# Patient Record
Sex: Female | Born: 1935 | Race: White | Hispanic: No | Smoking: Former smoker
Health system: Southern US, Community
[De-identification: ages and names within clinical notes are randomized; demographics above are authoritative.]

## PROBLEM LIST (undated history)

## (undated) DIAGNOSIS — E7800 Pure hypercholesterolemia, unspecified: Secondary | ICD-10-CM

## (undated) DIAGNOSIS — H409 Unspecified glaucoma: Secondary | ICD-10-CM

## (undated) DIAGNOSIS — E039 Hypothyroidism, unspecified: Secondary | ICD-10-CM

## (undated) DIAGNOSIS — I4892 Unspecified atrial flutter: Secondary | ICD-10-CM

## (undated) DIAGNOSIS — M8580 Other specified disorders of bone density and structure, unspecified site: Secondary | ICD-10-CM

## (undated) HISTORY — PX: APPENDECTOMY: SHX54

## (undated) HISTORY — DX: Unspecified glaucoma: H40.9

## (undated) HISTORY — DX: Hypothyroidism, unspecified: E03.9

## (undated) HISTORY — PX: BREAST SURGERY: SHX581

## (undated) HISTORY — DX: Other specified disorders of bone density and structure, unspecified site: M85.80

## (undated) HISTORY — PX: ABCESS DRAINAGE: SHX399

## (undated) HISTORY — PX: BREAST BIOPSY: SHX20

## (undated) HISTORY — PX: OTHER SURGICAL HISTORY: SHX169

## (undated) HISTORY — DX: Unspecified atrial flutter: I48.92

## (undated) HISTORY — DX: Pure hypercholesterolemia, unspecified: E78.00

---

## 1998-01-19 ENCOUNTER — Other Ambulatory Visit: Admission: RE | Admit: 1998-01-19 | Discharge: 1998-01-19 | Payer: Self-pay | Admitting: Gynecology

## 1998-08-10 ENCOUNTER — Other Ambulatory Visit: Admission: RE | Admit: 1998-08-10 | Discharge: 1998-08-10 | Payer: Self-pay | Admitting: Gynecology

## 1999-06-10 ENCOUNTER — Encounter: Payer: Self-pay | Admitting: Family Medicine

## 1999-06-10 ENCOUNTER — Encounter: Admission: RE | Admit: 1999-06-10 | Discharge: 1999-06-10 | Payer: Self-pay | Admitting: Family Medicine

## 1999-09-15 ENCOUNTER — Other Ambulatory Visit: Admission: RE | Admit: 1999-09-15 | Discharge: 1999-09-15 | Payer: Self-pay | Admitting: Gynecology

## 2000-06-13 ENCOUNTER — Encounter: Admission: RE | Admit: 2000-06-13 | Discharge: 2000-06-13 | Payer: Self-pay | Admitting: Family Medicine

## 2000-06-13 ENCOUNTER — Encounter: Payer: Self-pay | Admitting: Family Medicine

## 2000-09-19 ENCOUNTER — Other Ambulatory Visit: Admission: RE | Admit: 2000-09-19 | Discharge: 2000-09-19 | Payer: Self-pay | Admitting: Gynecology

## 2000-12-06 ENCOUNTER — Encounter: Payer: Self-pay | Admitting: Gynecology

## 2000-12-06 ENCOUNTER — Encounter: Admission: RE | Admit: 2000-12-06 | Discharge: 2000-12-06 | Payer: Self-pay | Admitting: Gynecology

## 2001-01-11 ENCOUNTER — Encounter (INDEPENDENT_AMBULATORY_CARE_PROVIDER_SITE_OTHER): Payer: Self-pay | Admitting: Specialist

## 2001-01-11 ENCOUNTER — Encounter: Payer: Self-pay | Admitting: General Surgery

## 2001-01-11 ENCOUNTER — Inpatient Hospital Stay (HOSPITAL_COMMUNITY): Admission: EM | Admit: 2001-01-11 | Discharge: 2001-01-15 | Payer: Self-pay | Admitting: Emergency Medicine

## 2001-06-20 ENCOUNTER — Encounter: Admission: RE | Admit: 2001-06-20 | Discharge: 2001-06-20 | Payer: Self-pay | Admitting: Family Medicine

## 2001-06-20 ENCOUNTER — Encounter: Payer: Self-pay | Admitting: Family Medicine

## 2001-10-04 ENCOUNTER — Other Ambulatory Visit: Admission: RE | Admit: 2001-10-04 | Discharge: 2001-10-04 | Payer: Self-pay | Admitting: Gynecology

## 2002-07-03 ENCOUNTER — Encounter: Admission: RE | Admit: 2002-07-03 | Discharge: 2002-07-03 | Payer: Self-pay | Admitting: Family Medicine

## 2002-07-03 ENCOUNTER — Encounter: Payer: Self-pay | Admitting: Family Medicine

## 2002-07-05 ENCOUNTER — Encounter: Admission: RE | Admit: 2002-07-05 | Discharge: 2002-07-05 | Payer: Self-pay | Admitting: Family Medicine

## 2002-07-05 ENCOUNTER — Encounter: Payer: Self-pay | Admitting: Family Medicine

## 2002-10-17 ENCOUNTER — Other Ambulatory Visit: Admission: RE | Admit: 2002-10-17 | Discharge: 2002-10-17 | Payer: Self-pay | Admitting: Gynecology

## 2003-07-04 ENCOUNTER — Encounter: Admission: RE | Admit: 2003-07-04 | Discharge: 2003-07-04 | Payer: Self-pay | Admitting: Family Medicine

## 2003-10-31 ENCOUNTER — Other Ambulatory Visit: Admission: RE | Admit: 2003-10-31 | Discharge: 2003-10-31 | Payer: Self-pay | Admitting: Gynecology

## 2004-07-20 ENCOUNTER — Encounter: Admission: RE | Admit: 2004-07-20 | Discharge: 2004-07-20 | Payer: Self-pay | Admitting: Family Medicine

## 2004-11-11 ENCOUNTER — Other Ambulatory Visit: Admission: RE | Admit: 2004-11-11 | Discharge: 2004-11-11 | Payer: Self-pay | Admitting: Gynecology

## 2005-07-22 ENCOUNTER — Encounter: Admission: RE | Admit: 2005-07-22 | Discharge: 2005-07-22 | Payer: Self-pay | Admitting: Gynecology

## 2005-11-17 ENCOUNTER — Other Ambulatory Visit: Admission: RE | Admit: 2005-11-17 | Discharge: 2005-11-17 | Payer: Self-pay | Admitting: Gynecology

## 2006-07-25 ENCOUNTER — Encounter: Admission: RE | Admit: 2006-07-25 | Discharge: 2006-07-25 | Payer: Self-pay | Admitting: Family Medicine

## 2006-11-30 ENCOUNTER — Other Ambulatory Visit: Admission: RE | Admit: 2006-11-30 | Discharge: 2006-11-30 | Payer: Self-pay | Admitting: Gynecology

## 2007-07-26 ENCOUNTER — Encounter: Admission: RE | Admit: 2007-07-26 | Discharge: 2007-07-26 | Payer: Self-pay | Admitting: Gynecology

## 2007-12-10 ENCOUNTER — Other Ambulatory Visit: Admission: RE | Admit: 2007-12-10 | Discharge: 2007-12-10 | Payer: Self-pay | Admitting: Gynecology

## 2008-03-25 ENCOUNTER — Ambulatory Visit: Payer: Self-pay | Admitting: Gynecology

## 2008-04-01 ENCOUNTER — Ambulatory Visit: Payer: Self-pay | Admitting: Gynecology

## 2008-07-29 ENCOUNTER — Encounter: Admission: RE | Admit: 2008-07-29 | Discharge: 2008-07-29 | Payer: Self-pay | Admitting: Gynecology

## 2008-09-23 ENCOUNTER — Encounter: Payer: Self-pay | Admitting: Internal Medicine

## 2008-12-19 ENCOUNTER — Ambulatory Visit: Payer: Self-pay | Admitting: Gynecology

## 2008-12-19 ENCOUNTER — Other Ambulatory Visit: Admission: RE | Admit: 2008-12-19 | Discharge: 2008-12-19 | Payer: Self-pay | Admitting: Gynecology

## 2008-12-19 ENCOUNTER — Encounter: Payer: Self-pay | Admitting: Gynecology

## 2009-01-08 ENCOUNTER — Encounter: Admission: RE | Admit: 2009-01-08 | Discharge: 2009-01-08 | Payer: Self-pay | Admitting: Sports Medicine

## 2009-07-30 ENCOUNTER — Encounter: Admission: RE | Admit: 2009-07-30 | Discharge: 2009-07-30 | Payer: Self-pay | Admitting: Gynecology

## 2009-12-21 ENCOUNTER — Other Ambulatory Visit: Admission: RE | Admit: 2009-12-21 | Discharge: 2009-12-21 | Payer: Self-pay | Admitting: Gynecology

## 2009-12-21 ENCOUNTER — Ambulatory Visit: Payer: Self-pay | Admitting: Gynecology

## 2010-05-11 ENCOUNTER — Ambulatory Visit
Admission: RE | Admit: 2010-05-11 | Discharge: 2010-05-11 | Payer: Self-pay | Source: Home / Self Care | Attending: Gynecology | Admitting: Gynecology

## 2010-05-27 NOTE — Letter (Signed)
Summary: [facility]   Imported By: Lester [place] 10/17/2008 11:16:05  _____________________________________________________________________  External Attachment:    Type:   Image     Comment:   External Document

## 2010-06-30 ENCOUNTER — Other Ambulatory Visit: Payer: Self-pay | Admitting: Gynecology

## 2010-06-30 DIAGNOSIS — Z1231 Encounter for screening mammogram for malignant neoplasm of breast: Secondary | ICD-10-CM

## 2010-08-03 ENCOUNTER — Ambulatory Visit
Admission: RE | Admit: 2010-08-03 | Discharge: 2010-08-03 | Disposition: A | Discharge status: Home / Self Care | Payer: Medicare Other | Source: Ambulatory Visit | Attending: Gynecology | Admitting: Gynecology

## 2010-08-03 DIAGNOSIS — Z1231 Encounter for screening mammogram for malignant neoplasm of breast: Secondary | ICD-10-CM

## 2010-08-04 ENCOUNTER — Other Ambulatory Visit: Payer: Self-pay | Admitting: Gynecology

## 2010-08-04 DIAGNOSIS — R928 Other abnormal and inconclusive findings on diagnostic imaging of breast: Secondary | ICD-10-CM

## 2010-08-06 ENCOUNTER — Ambulatory Visit
Admission: RE | Admit: 2010-08-06 | Discharge: 2010-08-06 | Disposition: A | Discharge status: Home / Self Care | Payer: Medicare Other | Source: Ambulatory Visit | Attending: Gynecology | Admitting: Gynecology

## 2010-08-06 DIAGNOSIS — R928 Other abnormal and inconclusive findings on diagnostic imaging of breast: Secondary | ICD-10-CM

## 2010-09-10 NOTE — H&P (Signed)
[facility]  Patient:    Megan Molina, Megan Molina Visit Number: [id number] MRN: [id number]          Service Type: SUR Location: 3W 0343 02 Attending Physician:  Henrene Dodge Dictated by:   Anselm Pancoast. Zachery Dakins, M.D. Admit Date:  01/11/2001                           History and Physical  CHIEF COMPLAINT:  Abdominal pain.  HISTORY:  Megan Molina is a 77 year old Caucasian female referred by Dr. Margrett Rud for evaluation of localized right lower quadrant pain. The patient was seen today by Dr. Andrey Campanile after an approximately two day progressive history of right lower quadrant tenderness and urinalysis was normal in his office and he found her definitely locally tender to the right lower quadrant. He did a pelvic exam exam and thought this was unremarkable and called and I suggested I see her in the emergency room. On physical exam in the emergency room, she was not febrile but she was definitely locally tender in the right lower quadrant of her abdomen. She is 52, postmenopausal, and has not had a hysterectomy or other gynecological surgery, and I discussed with her on what her symptoms were and she said that Monday, September 16, possibly Tuesday, September 17, she was a little gaseous, a little bloated, not really having any significant localized pain and then yesterday, September 18, started having definitely right lower quadrant pain that has progressed today. Her symptoms have not been associated with vomiting and she is not running a fever. On physical exam, she was definitely tender, well localized to the right lower quadrant with muscle guarding and mild rebound. She is not tender in the upper abdomen and is not tender on the left and she said she has had some loose bowel movements but not really diarrhea. Chest x-ray was unremarkable and CBC showed a white count of 10,900 with a slight left shift but that not of a obvious marked inflammatory type  reaction. I then sent her over for a pelvic ultrasound and they said that as far as her right tube and ovary looked unremarkable and that she seemed to have _______ tenderness in the area of her cecum. I thought this was most likely appendicitis probably was not _______ nausea and vomiting, it was probably a retrocecal appendicitis.  PAST MEDICAL HISTORY:  She has had previous face lift and breast surgery.  ALLERGIES:  She says she is allergic to SULFA.  MEDICATIONS:  Her only chronic medication is thyroid replacement and a cholesterol medicine.  REVIEW OF SYSTEMS:  EYES, EARS, NOSE and THROAT: Denies acute symptoms. CHEST: I think she does smoke. CARDIAC: No history of angina or hypertension. ABDOMEN: See history of present illness. GYN: She is about 15 years postmenopausal. EXTREMITIES: Unremarkable. CNS: No problems.  PHYSICAL EXAMINATION:  GENERAL:  She is a female who appears younger than her stated age.  VITAL SIGNS:  Temperature was 97, blood pressure 145/64, 76, and respirations 16.  HEENT:  Unremarkable.  LUNGS:  Clear.  HEART:  Normal sinus rhythm.  BREASTS:  Negative.  ABDOMEN:  Locally tender right lower quadrant with muscle guarding, mild rebound. She is not tender in left lower quadrant.  PELVIC AND RECTAL: I did not do a pelvic or rectal as Dr. Andrey Campanile had just done it and he reported it was unremarkable.  EXTREMITIES:  Negative.  ADMISSION IMPRESSION:  It is probably right lower  quadrant, probably acute appendicitis, possibly retrocecal appendicitis.  PLAN:  The patient will be started on intravenous fluids, given 3 g of Unasyn, and scheduled for right lower quadrant exploration and appendectomy. Dictated by:   Anselm Pancoast. Zachery Dakins, M.D. Attending Physician:  Henrene Dodge DD:  01/11/01 TD:  01/11/01 Job: [id number]

## 2010-09-10 NOTE — Op Note (Signed)
[facility]  Patient:    Megan Molina, Megan Molina Visit Number: [id number] MRN: [id number]          Service Type: SUR Location: 3W 0343 02 Attending Physician:  Henrene Dodge Dictated by:   Anselm Pancoast. Zachery Dakins, M.D. Proc. Date: 01/11/01 Admit Date:  01/11/2001   CC:         Duffy Rhody C. Andrey Campanile, M.D.   Operative Report  PREOPERATIVE DIAGNOSIS:   Acute appendicitis.  POSTOPERATIVE DIAGNOSIS:  Acute cecal diverticulitis with abscess.  OPERATION:  Limited right colectomy, sort of a cecectomy with ileo-ascending colon anastomosis.  SURGEON:  Anselm Pancoast. Zachery Dakins, M.D.  ASSISTANT:  Nurse.  ANESTHESIA:  General.  INDICATIONS:  Megan Molina is a 77 year old Caucasian female who has had two day progressive history of right lower quadrant pain.  She has not had fever.  She has been slightly nauseated.  She has not vomited.  She was referred by Dr. Andrey Campanile.  He referred her.  On physical exam, she is definitely tender in the right lower quadrant.  White count was 10,900 and an ultrasound showed normal female organs and tenderness around the cecum.  I recommended that we proceed with an appendectomy.  Unasyn 3 was given.  She has an IV started and after induction of anesthesia, a Foley catheter was inserted.  With the anesthesia of the tenderness you could then feel a fullness/mass in the cecal area, and I made the incision just slightly higher than the usual McBurney point.  Sharp dissection down through the skin and subcutaneous, Scarpas fascia and external oblique was opened in its fascia lines and then the rectus muscle pulled medially and the transversalis and internal oblique were opened and then I opened it to the peritoneal cavity.  The cecum was right under the incision and with running the finger back into the retroperitoneal area you could feel this mass that looked like an inflammatory mass really at the cecum area.  The appendix was really basically  a retrocecal appendix but the appendix itself did not appear to be acutely inflamed.  I mobilized the peritoneum laterally so that the cecum and terminal ileum could be developed through this little incision.  I enlarged the incision just slightly but she is so thin I could bring it up above skin level.  I then on inspecting the area felt that this was probably a cecal diverticulitis.  There was obviously a mass in the cecum not an appendiceal abscess, and I elected to transect the mesentry from the terminal ileum just basically the cecum with Kellys, ligating this with 2-0 silk ties and then used the GIA to actually transect the cecum really about 3 inches from the ileocecal valve.  This was about 1 inch proximal to this area of inflammation.  I then used a Kocher to kind of transect the terminal ileum or actually cut this area and opened it on the back table and it looked as if this was definitely an inflammatory mass.  No evidence of any palpable tumor in the cecum.  I then sent this over to pathology and had the pathologist come in, and he opened the area and said there was a frank abscess within the cecal wall close to where the appendix was but whether this was a cecal diverticulitis or some type of variation of a ruptured appendix at his base, he could not really be sure it was definitely not a malignancy.  While this was being done, I did an anastomosis  from the terminal ileum to the anterior portion of the staple suture lines so I have taken this down into a single layer of 3-0 silk and notched it to part of the anastomosis which lies comfortable.  The little mesenteric defect was closed. There was a good two finger opening at the completion of the anastomosis.  I then placed everything back in its normal anatomic position, irrigated the area, good hemostasis and then closed the abdominal wall with a 2-0 Vicryl along the posterior rectus fascia, peritoneum and transversalis  laterally and then interrupted 2-0 Vicryl and then anterior fascia was a running 2-0 Vicryl also.  The wound was irrigated between layers.  The Scarpas was closed with some 3-0 Vicryl and then 5-0 Dexon subcuticular and Steri-Strips on the small incision.  Sponge and needle counts are correct.  The estimated blood loss is minimal.  I am planning to keep the patient NPO.  Hopefully I will not have to place a nasogastric tube, and we will keep her on antibiotics for at least 48 hours. Dictated by:   Anselm Pancoast. Zachery Dakins, M.D. Attending Physician:  Henrene Dodge DD:  01/11/01 TD:  01/11/01 Job: [id number]

## 2010-09-10 NOTE — Discharge Summary (Signed)
[facility]  Patient:    Megan Molina, Megan Molina Visit Number: [id number] MRN: [id number]          Service Type: SUR Location: 3W 0343 02 Attending Physician:  Henrene Dodge Dictated by:   Anselm Pancoast. Zachery Dakins, M.D. Admit Date:  01/11/2001 Discharge Date: 01/15/2001   CC:         Duffy Rhody C. Andrey Campanile, M.D.  Hedwig Morton. Juanda Chance, M.D. [facility]   Discharge Summary  DISCHARGE DIAGNOSIS:  Cecal diverticulitis with confined abscess.  OPERATION:  Right colectomy.  HISTORY OF PRESENT ILLNESS/HOSPITAL COURSE:  Megan Molina is a 77 year old Caucasian female referred to me by Dr. Margrett Rud with the presumptive diagnosis of acute appendicitis.  She had about a 48-hour history of progressive pain in the right lower quadrant, minimal nausea and, on physical exam, she was definitely tender with muscle guarding well localized to the right lower quadrant.  White count was 10,900, urinalysis was negative, and I recommended we proceed on with an open appendectomy.  After surgery, however, she was noted not to have an inflamed appendix.  The appendix was in the immediate area, but there was a definite inflammatory mass in the cecum that was predominantly posterior to the cecum and this was all elevated and a limited right colectomy was performed.  The pathologist was asked to come in and do a frozen on the exam to make sure it was not a tumor and there was a confined abscess within the wall of the cecum that originated from a cecal diverticulum.  On review of the permanent slide, the pathologist which is another one, Dr. Luisa Hart, found also inflammation within the appendix.  But, it appeared that the inflammation in the cecum was not the periappendiceal abscess like a more typical appendicitis and I think that the cecal diverticulum was the primary problem.  Anyway, postoperatively, she did nicely.  We did not place a nasogastric tube, we kept her n.p.o., and on about the second  postoperative day, she was able to void.  She had had a Foley that was removed the first postoperative morning and was started on a diet on the third postoperative day and discharged on the fourth. She was tolerating liquids without problems, her incision which was small in the right lower quadrant was healing nicely, and she was discharged to be seen in my office in approximately one week.  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  The patient had had a previous colonoscopy approximately three months by Dr. Lina Sar that showed no abnormalities as far as adenomas or other problems. Dictated by:   Anselm Pancoast. Zachery Dakins, M.D. Attending Physician:  Henrene Dodge DD:  02/15/01 TD:  02/17/01 Job: [id number]

## 2010-10-18 ENCOUNTER — Encounter: Payer: Self-pay | Admitting: Internal Medicine

## 2010-12-23 ENCOUNTER — Encounter: Payer: Medicare Other | Admitting: Gynecology

## 2010-12-24 ENCOUNTER — Ambulatory Visit (INDEPENDENT_AMBULATORY_CARE_PROVIDER_SITE_OTHER): Payer: Medicare Other | Admitting: Gynecology

## 2010-12-24 ENCOUNTER — Encounter: Payer: Self-pay | Admitting: Gynecology

## 2010-12-24 VITALS — BP 114/60 | Ht 65.75 in | Wt 133.0 lb

## 2010-12-24 DIAGNOSIS — N952 Postmenopausal atrophic vaginitis: Secondary | ICD-10-CM

## 2010-12-24 DIAGNOSIS — M899 Disorder of bone, unspecified: Secondary | ICD-10-CM

## 2010-12-24 MED ORDER — ALENDRONATE SODIUM 70 MG PO TABS: 70.0000 mg | ORAL_TABLET | ORAL | Status: DC

## 2010-12-24 NOTE — Progress Notes (Signed)
Megan Molina 08/18/1932 [id number]        77 y.o.  for followup. History of ostial pia -2.0 T score right femoral neck January 2012. She is on Fosamax 70 mg weekly.  Past medical history,surgical history, medications, allergies, family history and social history were all reviewed and documented in the EPIC chart. ROS:  Was performed and pertinent positives and negatives are included in the history.  Exam: chaperone present Filed Vitals:   12/24/10 1038  BP: 114/60   General appearance  Normal Skin grossly normal Head/Neck normal with no cervical or supraclavicular adenopathy thyroid normal Lungs  clear Cardiac RR, without RMG Abdominal  soft, nontender, without masses, organomegaly or hernia Breasts  examined lying and sitting without masses, retractions, discharge or axillary adenopathy. Pelvic  Ext/BUS/vagina  normal atrophic genital changes noted  Cervix  normal  Atrophic Pap not done  Uterus  anteverted, normal size, shape and contour, midline and mobile nontender   Adnexa  Without masses or tenderness    Anus and perineum  normal   Rectovaginal  normal sphincter tone without palpated masses or tenderness.    Assessment/Plan:  77 y.o. female for followup. Has atrophic genital changes she is asymptomatic from these and we'll continue to monitor. Being followed for osteopenia -2.0 T score. She is on Fosamax 70 mg weekly starting this January 2010. She is due for repeat DEXA January 2014 and will continue on Fosamax now. I refilled her times a year. She's aware of the risks benefits and is comfortable continuing the medication. Increase calcium vitamin D reviewed. Self breast exams on a monthly basis discussed. She had a mammography April 2012 continue annual mammography. She's scheduling a colonoscopy this fall. No blood work was done as is all done through her primary office.    Dara Lords MD, 11:07 AM 12/24/2010

## 2011-04-26 HISTORY — PX: COLONOSCOPY: SHX174

## 2011-07-08 ENCOUNTER — Other Ambulatory Visit: Payer: Self-pay | Admitting: Gynecology

## 2011-07-08 ENCOUNTER — Encounter: Payer: Self-pay | Admitting: Internal Medicine

## 2011-07-08 DIAGNOSIS — Z1231 Encounter for screening mammogram for malignant neoplasm of breast: Secondary | ICD-10-CM

## 2011-08-11 ENCOUNTER — Ambulatory Visit
Admission: RE | Admit: 2011-08-11 | Discharge: 2011-08-11 | Disposition: A | Discharge status: Home / Self Care | Payer: Medicare Other | Source: Ambulatory Visit | Attending: Gynecology | Admitting: Gynecology

## 2011-08-11 DIAGNOSIS — Z1231 Encounter for screening mammogram for malignant neoplasm of breast: Secondary | ICD-10-CM

## 2011-08-19 ENCOUNTER — Ambulatory Visit (AMBULATORY_SURGERY_CENTER): Payer: Medicare Other | Admitting: *Deleted

## 2011-08-19 VITALS — Ht 66.0 in | Wt 134.7 lb

## 2011-08-19 DIAGNOSIS — Z1211 Encounter for screening for malignant neoplasm of colon: Secondary | ICD-10-CM

## 2011-08-19 MED ORDER — PEG-KCL-NACL-NASULF-NA ASC-C 100 G PO SOLR: ORAL | Status: DC

## 2011-09-02 ENCOUNTER — Encounter: Payer: Medicare Other | Admitting: Internal Medicine

## 2011-09-15 ENCOUNTER — Encounter: Payer: Medicare Other | Admitting: Internal Medicine

## 2011-09-16 ENCOUNTER — Encounter: Payer: Self-pay | Admitting: Internal Medicine

## 2011-09-16 ENCOUNTER — Encounter: Payer: Medicare Other | Admitting: Internal Medicine

## 2011-09-16 ENCOUNTER — Ambulatory Visit (AMBULATORY_SURGERY_CENTER): Payer: Medicare Other | Admitting: Internal Medicine

## 2011-09-16 VITALS — BP 133/60 | HR 65 | Temp 98.8°F | Resp 22 | Ht 66.0 in | Wt 134.0 lb

## 2011-09-16 DIAGNOSIS — Z1211 Encounter for screening for malignant neoplasm of colon: Secondary | ICD-10-CM

## 2011-09-16 MED ORDER — SODIUM CHLORIDE 0.9 % IV SOLN: 500.0000 mL | INTRAVENOUS | Status: DC

## 2011-09-16 NOTE — Op Note (Signed)
[facility] [address] [place], [place]  [address]  COLONOSCOPY PROCEDURE REPORT  PATIENT:  Megan Molina, Megan Molina  MR#:  [id number] BIRTHDATE:  1932-06-07, 79 yrs. old  GENDER:  female ENDOSCOPIST:  Hedwig Morton. Juanda Chance, MD REF. BY:  Thayer Headings, M.D. PROCEDURE DATE:  09/16/2011 PROCEDURE:  Colonoscopy 19147 ASA CLASS:  Class II INDICATIONS:  colorectal cancer screening, average risk last colon 2002 was normal MEDICATIONS:   MAC sedation, administered by CRNA, propofol (Diprivan) 160 mg  DESCRIPTION OF PROCEDURE:   After the risks and benefits and of the procedure were explained, informed consent was obtained. Digital rectal exam was performed and revealed no rectal masses. The LB CF-[id number] endoscope was introduced through the anus and advanced to the cecum, which was identified by both the appendix and ileocecal valve.  The quality of the prep was good, using MoviPrep.  The instrument was then slowly withdrawn as the colon was fully examined. <<PROCEDUREIMAGES>>  FINDINGS:  No polyps or cancers were seen (see image1, image2, image3, image4, and image5).   Retroflexed views in the rectum revealed no abnormalities.    The scope was then withdrawn from the patient and the procedure completed.  COMPLICATIONS:  None ENDOSCOPIC IMPRESSION: 1) No polyps or cancers 2) Normal colonoscopy RECOMMENDATIONS: 1) High fiber diet.  REPEAT EXAM:  In 0 year(s) for.  ______________________________ Hedwig Morton. Juanda Chance, MD  CC:  n. eSIGNED:   Hedwig Morton. Iyana Topor at 09/16/2011 11:16 AM  Herrera, Maryclare Bean, [phone]

## 2011-09-16 NOTE — Progress Notes (Signed)
Patient did not experience any of the following events: a burn prior to discharge; a fall within the facility; wrong site/side/patient/procedure/implant event; or a hospital transfer or hospital admission upon discharge from the facility. (G8907) Patient did not have preoperative order for IV antibiotic SSI prophylaxis. (G8918)  

## 2011-09-16 NOTE — Patient Instructions (Signed)
NORMAL EXAM TODAY. TRY TO FOLLOW HIGH FIBER DIET,SEE HANDOUT. FOLLOW DISCHARGE INSTRUCTIONS TODAY.   YOU HAD AN ENDOSCOPIC PROCEDURE TODAY AT THE [facility]: Refer to the procedure report that was given to you for any specific questions about what was found during the examination.  If the procedure report does not answer your questions, please call your gastroenterologist to clarify.  If you requested that your care partner not be given the details of your procedure findings, then the procedure report has been included in a sealed envelope for you to review at your convenience later.  YOU SHOULD EXPECT: Some feelings of bloating in the abdomen. Passage of more gas than usual.  Walking can help get rid of the air that was put into your GI tract during the procedure and reduce the bloating. If you had a lower endoscopy (such as a colonoscopy or flexible sigmoidoscopy) you may notice spotting of blood in your stool or on the toilet paper. If you underwent a bowel prep for your procedure, then you may not have a normal bowel movement for a few days.  DIET: Your first meal following the procedure should be a light meal and then it is ok to progress to your normal diet.  A half-sandwich or bowl of soup is an example of a good first meal.  Heavy or fried foods are harder to digest and may make you feel nauseous or bloated.  Likewise meals heavy in dairy and vegetables can cause extra gas to form and this can also increase the bloating.  Drink plenty of fluids but you should avoid alcoholic beverages for 24 hours.  ACTIVITY: Your care partner should take you home directly after the procedure.  You should plan to take it easy, moving slowly for the rest of the day.  You can resume normal activity the day after the procedure however you should NOT DRIVE or use heavy machinery for 24 hours (because of the sedation medicines used during the test).    SYMPTOMS TO REPORT IMMEDIATELY: A gastroenterologist  can be reached at any hour.  During normal business hours, 8:30 AM to 5:00 PM Monday through Friday, call [phone].  After hours and on weekends, please call the GI answering service at [phone] who will take a message and have the physician on call contact you.   Following lower endoscopy (colonoscopy or flexible sigmoidoscopy):  Excessive amounts of blood in the stool  Significant tenderness or worsening of abdominal pains  Swelling of the abdomen that is new, acute  Fever of 100F or higher  FOLLOW UP: If any biopsies were taken you will be contacted by phone or by letter within the next 1-3 weeks.  Call your gastroenterologist if you have not heard about the biopsies in 3 weeks.  Our staff will call the home number listed on your records the next business day following your procedure to check on you and address any questions or concerns that you may have at that time regarding the information given to you following your procedure. This is a courtesy call and so if there is no answer at the home number and we have not heard from you through the emergency physician on call, we will assume that you have returned to your regular daily activities without incident.  SIGNATURES/CONFIDENTIALITY: You and/or your care partner have signed paperwork which will be entered into your electronic medical record.  These signatures attest to the fact that that the information above on your After  Visit Summary has been reviewed and is understood.  Full responsibility of the confidentiality of this discharge information lies with you and/or your care-partner.

## 2011-09-20 ENCOUNTER — Telehealth: Payer: Self-pay

## 2011-09-20 NOTE — Telephone Encounter (Signed)
  Follow up Call-  Call back number 09/16/2011  Post procedure Call Back phone  # [phone]  Permission to leave phone message Yes     Patient questions:  Do you have a fever, pain , or abdominal swelling? no Pain Score  0 *  Have you tolerated food without any problems? yes  Have you been able to return to your normal activities? yes  Do you have any questions about your discharge instructions: Diet   no Medications  no Follow up visit  no  Do you have questions or concerns about your Care? no  Actions: * If pain score is 4 or above: No action needed, pain <4.

## 2011-12-07 ENCOUNTER — Other Ambulatory Visit: Payer: Self-pay | Admitting: Gynecology

## 2012-01-05 ENCOUNTER — Encounter: Payer: Self-pay | Admitting: Gynecology

## 2012-01-05 ENCOUNTER — Ambulatory Visit (INDEPENDENT_AMBULATORY_CARE_PROVIDER_SITE_OTHER): Payer: Medicare Other | Admitting: Gynecology

## 2012-01-05 VITALS — BP 106/60 | Ht 65.75 in | Wt 133.0 lb

## 2012-01-05 DIAGNOSIS — N952 Postmenopausal atrophic vaginitis: Secondary | ICD-10-CM

## 2012-01-05 DIAGNOSIS — M949 Disorder of cartilage, unspecified: Secondary | ICD-10-CM

## 2012-01-05 DIAGNOSIS — M8580 Other specified disorders of bone density and structure, unspecified site: Secondary | ICD-10-CM

## 2012-01-05 DIAGNOSIS — M899 Disorder of bone, unspecified: Secondary | ICD-10-CM

## 2012-01-05 MED ORDER — ALENDRONATE SODIUM 70 MG PO TABS: 70.0000 mg | ORAL_TABLET | ORAL | Status: DC

## 2012-01-05 NOTE — Addendum Note (Signed)
Addended by: Dara Lords on: 01/05/2012 11:17 AM   Modules accepted: Orders

## 2012-01-05 NOTE — Progress Notes (Signed)
Megan Molina [birth year] [id number]        78 y.o.  G3P3 for follow up exam.    Past medical history,surgical history, medications, allergies, family history and social history were all reviewed and documented in the EPIC chart. ROS:  Was performed and pertinent positives and negatives are included in the history.  Exam: Sherrilyn Rist assistant Filed Vitals:   01/05/12 0905  BP: 106/60  Height: 5' 5.75" (1.67 m)  Weight: 133 lb (60.328 kg)   General appearance  Normal Skin grossly normal Head/Neck normal with no cervical or supraclavicular adenopathy thyroid normal Lungs  clear Cardiac RR, without RMG Abdominal  soft, nontender, without masses, organomegaly or hernia Breasts  examined lying and sitting without masses, retractions, discharge or axillary adenopathy. Pelvic  Ext/BUS/vagina  normal with atrophic changes  Cervix  normal with atrophic changes  Uterus  axial, normal size, shape and contour, midline and mobile nontender   Adnexa  Without masses or tenderness    Anus and perineum  normal   Rectovaginal  normal sphincter tone without palpated masses or tenderness.    Assessment/Plan:  78 y.o. G3P3 female for follow up exam.   1. Atrophic genital changes. Patient is asymptomatic. We'll continue to follow.  No bleeding or other menopausal issues. 2. Osteopenia. DEXA 04/2010 T score -2.0 is on Fosamax 70 mg weekly started January 2010. We'll plan repeat DEXA beginning of next year at a 2 year interval. If stable will plan stopping Fosamax for a drug-free holiday.  Increased calcium vitamin D reviewed. 3. Pap smear. No Pap smear done. No history of abnormal Pap smears with last Pap smear 2011. Discussed current screening guidelines and will stop screening and she is over the age of 8. 4. Mammography. Patient had her mammogram 07/2011. We'll continue with annual mammography. SBE monthly reviewed. 5. Colonoscopy. Patient had 2013. We'll follow up with their recommended  interval. 6. Health maintenance. The lab work was done this is all done through her primary physician's office who she sees on a regular basis. Follow up for DEXA in 5-6 months, annual follow up exam in one year.    Dara Lords MD, 9:43 AM 01/05/2012

## 2012-01-05 NOTE — Patient Instructions (Signed)
Bone Densitometry Bone densitometry is a special X-ray that measures your bone density and can be used to help predict your risk of bone fractures. This test is used to determine bone mineral content and density to diagnose osteoporosis. Osteoporosis is the loss of bone that may cause the bone to become weak. Osteoporosis commonly occurs in women entering menopause. However, it may be found in men and in people with other diseases. PREPARATION FOR TEST No preparation necessary. WHO SHOULD BE TESTED?  All women older than 65.   Postmenopausal women (50 to 65) with risk factors for osteoporosis.   People with a previous fracture caused by normal activities.   People with a small body frame (less than 127 poundsor a body mass index [BMI] of less than 21).   People who have a parent with a hip fracture or history of osteoporosis.   People who smoke.   People who have rheumatoid arthritis.   Anyone who engages in excessive alcohol use (more than 3 drinks most days).   Women who experience early menopause.  WHEN SHOULD YOU BE RETESTED? Current guidelines suggest that you should wait at least 2 years before doing a bone density test again if your first test was normal.Recent studies indicated that women with normal bone density may be able to wait a few years before needing to repeat a bone density test. You should discuss this with your caregiver.  NORMAL FINDINGS   Normal: less than standard deviation below normal (greater than -1).   Osteopenia: 1 to 2.5 standard deviations below normal (-1 to -2.5).   Osteoporosis: greater than 2.5 standard deviations below normal (less than -2.5).  Test results are reported as a "T score" and a "Z score."The T score is a number that compares your bone density with the bone density of healthy, young women.The Z score is a number that compares your bone density with the scores of women who are the same age, gender, and race.  Ranges for normal  findings may vary among different laboratories and hospitals. You should always check with your doctor after having lab work or other tests done to discuss the meaning of your test results and whether your values are considered within normal limits. MEANING OF TEST  Your caregiver will go over the test results with you and discuss the importance and meaning of your results, as well as treatment options and the need for additional tests if necessary. OBTAINING THE TEST RESULTS It is your responsibility to obtain your test results. Ask the lab or department performing the test when and how you will get your results. Document Released: 05/03/2004 Document Revised: 03/31/2011 Document Reviewed: 05/26/2010 [employer] Patient Information 2012 [employer]. 

## 2012-01-11 ENCOUNTER — Encounter: Payer: Self-pay | Admitting: Gynecology

## 2012-05-31 ENCOUNTER — Ambulatory Visit (INDEPENDENT_AMBULATORY_CARE_PROVIDER_SITE_OTHER): Payer: Medicare Other

## 2012-05-31 DIAGNOSIS — M949 Disorder of cartilage, unspecified: Secondary | ICD-10-CM

## 2012-05-31 DIAGNOSIS — M8580 Other specified disorders of bone density and structure, unspecified site: Secondary | ICD-10-CM

## 2012-05-31 DIAGNOSIS — M899 Disorder of bone, unspecified: Secondary | ICD-10-CM

## 2012-06-05 ENCOUNTER — Telehealth: Payer: Self-pay | Admitting: Gynecology

## 2012-06-05 NOTE — Telephone Encounter (Signed)
Pt informed with the below note and will continue rx.

## 2012-06-05 NOTE — Telephone Encounter (Signed)
Tell patient that her bone density is stable. I believe she has been on Fosamax for years now I would recommend staying on it one more year through next January then stopping it and we will repeat the DEXA in 2 years, that will be one year after stopping the Fosamax. By continuing one more year she will have received 5 years total by my calculations and I think that is a good stopping point.

## 2012-07-13 ENCOUNTER — Other Ambulatory Visit: Payer: Self-pay

## 2012-07-13 DIAGNOSIS — Z1231 Encounter for screening mammogram for malignant neoplasm of breast: Secondary | ICD-10-CM

## 2012-08-31 ENCOUNTER — Ambulatory Visit
Admission: RE | Admit: 2012-08-31 | Discharge: 2012-08-31 | Disposition: A | Discharge status: Home / Self Care | Payer: Medicare Other | Source: Ambulatory Visit

## 2012-08-31 DIAGNOSIS — Z1231 Encounter for screening mammogram for malignant neoplasm of breast: Secondary | ICD-10-CM

## 2012-12-31 ENCOUNTER — Other Ambulatory Visit: Payer: Self-pay | Admitting: Gynecology

## 2012-12-31 NOTE — Telephone Encounter (Signed)
Has yearly exam scheduled 03/21/13.

## 2013-02-26 ENCOUNTER — Other Ambulatory Visit: Payer: Self-pay | Admitting: Gynecology

## 2013-02-26 NOTE — Telephone Encounter (Signed)
Has annual exam scheduled 03/01/13.

## 2013-03-01 ENCOUNTER — Encounter: Payer: Self-pay | Admitting: Gynecology

## 2013-03-01 ENCOUNTER — Ambulatory Visit (INDEPENDENT_AMBULATORY_CARE_PROVIDER_SITE_OTHER): Payer: Medicare Other | Admitting: Gynecology

## 2013-03-01 VITALS — BP 120/78 | Ht 65.0 in | Wt 135.0 lb

## 2013-03-01 DIAGNOSIS — M899 Disorder of bone, unspecified: Secondary | ICD-10-CM

## 2013-03-01 DIAGNOSIS — N952 Postmenopausal atrophic vaginitis: Secondary | ICD-10-CM

## 2013-03-01 DIAGNOSIS — M8580 Other specified disorders of bone density and structure, unspecified site: Secondary | ICD-10-CM

## 2013-03-01 MED ORDER — ALENDRONATE SODIUM 70 MG PO TABS: ORAL_TABLET | ORAL | Status: DC

## 2013-03-01 NOTE — Progress Notes (Signed)
Azilee-Alicia Overall 1932-05-18 [id number]        79 y.o.  G3P3 for followup exam.  Several issues noted below.  Past medical history,surgical history, problem list, medications, allergies, family history and social history were all reviewed and documented in the EPIC chart.  ROS:  Performed and pertinent positives and negatives are included in the history, assessment and plan .  Exam: Kim assistant Filed Vitals:   03/01/13 1055  BP: 120/78  Height: 5\' 5"  (1.651 m)  Weight: 135 lb (61.236 kg)   General appearance  Normal Skin grossly normal Head/Neck normal with no cervical or supraclavicular adenopathy thyroid normal Lungs  clear Cardiac RR, without RMG Abdominal  soft, nontender, without masses, organomegaly or hernia Breasts  examined lying and sitting without masses, retractions, discharge or axillary adenopathy. Pelvic  Ext/BUS/vagina  atrophic changes  Cervix  atrophic changes  Uterus  axial to anteverted, normal size, shape and contour, midline and mobile nontender   Adnexa  Without masses or tenderness    Anus and perineum  normal   Rectovaginal  normal sphincter tone without palpated masses or tenderness.    Assessment/Plan:  79 y.o. G3P3 female for followup exam.   1. Postmenopausal/atrophic genital changes. Patient without significant hot flashes, night sweats, vaginal dryness or dyspareunia. No bleeding. We'll continue to monitor. 2. Osteopenia. DEXA 05/2012 with T score -2.3. Stable from 2012 DEXA. Has been on alendronate 70 mg since January 2010. Will finish out this year for a five-year course and then she will stop it for a drug-free holiday and we'll plan on repeat DEXA beginning of 2016. Increase calcium vitamin D reviewed. 3. Pap smear 2011. No Pap smear done today. No history of abnormal Pap smears. Review current screening guidelines and will stop screening issues over the age of 62 and she is comfortable with this. 4. Mammography 08/2012. Continue with annual  mammography. SBE monthly review. 5. Colonoscopy 2012. Repeat at their recommended interval. 6. Health maintenance. No lab work done and she reports this is all done through her primary physician's office. Followup one year, sooner as needed.  Note: This document was prepared with digital dictation and possible smart phrase technology. Any transcriptional errors that result from this process are unintentional.   Dara Lords MD, 11:18 AM 03/01/2013

## 2013-03-01 NOTE — Patient Instructions (Signed)
Stop your Fosamax after this year. Followup in one year for annual exam.

## 2013-03-08 ENCOUNTER — Other Ambulatory Visit: Payer: Self-pay

## 2013-03-08 DIAGNOSIS — Z1231 Encounter for screening mammogram for malignant neoplasm of breast: Secondary | ICD-10-CM

## 2013-09-03 ENCOUNTER — Encounter (INDEPENDENT_AMBULATORY_CARE_PROVIDER_SITE_OTHER): Payer: Self-pay

## 2013-09-03 ENCOUNTER — Ambulatory Visit
Admission: RE | Admit: 2013-09-03 | Discharge: 2013-09-03 | Disposition: A | Discharge status: Home / Self Care | Payer: Medicare Other | Source: Ambulatory Visit

## 2013-09-03 ENCOUNTER — Other Ambulatory Visit: Payer: Self-pay

## 2013-09-03 DIAGNOSIS — Z1231 Encounter for screening mammogram for malignant neoplasm of breast: Secondary | ICD-10-CM

## 2014-02-24 ENCOUNTER — Encounter: Payer: Self-pay | Admitting: Gynecology

## 2014-03-06 ENCOUNTER — Encounter: Payer: Self-pay | Admitting: Gynecology

## 2014-03-06 ENCOUNTER — Ambulatory Visit (INDEPENDENT_AMBULATORY_CARE_PROVIDER_SITE_OTHER): Payer: Medicare Other | Admitting: Gynecology

## 2014-03-06 VITALS — BP 124/70 | Ht 65.5 in | Wt 138.0 lb

## 2014-03-06 DIAGNOSIS — N952 Postmenopausal atrophic vaginitis: Secondary | ICD-10-CM

## 2014-03-06 DIAGNOSIS — M8580 Other specified disorders of bone density and structure, unspecified site: Secondary | ICD-10-CM

## 2014-03-06 NOTE — Progress Notes (Signed)
Megan Molina [birth year] [id number]        80 y.o.  G3P3 for follow up exam. Several issues noted below.  Past medical history,surgical history, problem list, medications, allergies, family history and social history were all reviewed and documented as reviewed in the EPIC chart.  ROS:  12 system ROS performed with pertinent positives and negatives included in the history, assessment and plan.   Additional significant findings :  none   Exam: Kim Counsellor Vitals:   03/06/14 0830  BP: 124/70  Height: 5' 5.5" (1.664 m)  Weight: 138 lb (62.596 kg)   General appearance:  Normal affect, orientation and appearance. Skin: Grossly normal HEENT: Without gross lesions.  No cervical or supraclavicular adenopathy. Thyroid normal.  Lungs:  Clear without wheezing, rales or rhonchi Cardiac: RR, without RMG Abdominal:  Soft, nontender, without masses, guarding, rebound, organomegaly or hernia Breasts:  Examined lying and sitting without masses, retractions, discharge or axillary adenopathy. Pelvic:  Ext/BUS/vagina with atrophic changes.  Cervix atrophic  Uterus . Anteverted, normal size, shape and contour, midline and mobile nontender   Adnexa  Without masses or tenderness    Anus and perineum  Normal   Rectovaginal  Normal sphincter tone without palpated masses or tenderness.    Assessment/Plan:  80 y.o. G3P3 female for follow up exam.   1. Postmenopausal. Without significant kyphosis, night sweats, vaginal dryness. Is not sexually active. No vaginal bleeding. Continue to monitor. Report any vaginal bleeding. 2. Osteopenia. DEXA 05/2012 T score -2.3. Stable from 2012 DEXA. Had been on alendronate 70 mg 2010 through 2014 for a 5 year course. Is now on a drug-free holiday. We'll check DEXA this coming year a two-year interval. Increased calcium vitamin D reviewed. 3. Pap smear 2011. No Pap smear done today. No history of significant abnormal Pap smears. Per current screening guidelines we  both agree to stop screening as she is over the age of 78. 4. Mammography 08/2013. Continue with annual mammography. SBE monthly reviewed. 5. Colonoscopy 2013. Repeat at their recommended interval. 6. Health maintenance. No routine blood work done as she does this at her primary physician's office. Follow up 1 year, sooner as needed.     Anastasio Auerbach MD, 8:51 AM 03/06/2014

## 2014-03-06 NOTE — Patient Instructions (Signed)
You may obtain a copy of any labs that were done today by logging onto MyChart as outlined in the instructions provided with your AVS (after visit summary). The office will not call with normal lab results but certainly if there are any significant abnormalities then we will contact you.   Health Maintenance, Female A healthy lifestyle and preventative care can promote health and wellness.  Maintain regular health, dental, and eye exams.  Eat a healthy diet. Foods like vegetables, fruits, whole grains, low-fat dairy products, and lean protein foods contain the nutrients you need without too many calories. Decrease your intake of foods high in solid fats, added sugars, and salt. Get information about a proper diet from your caregiver, if necessary.  Regular physical exercise is one of the most important things you can do for your health. Most adults should get at least 150 minutes of moderate-intensity exercise (any activity that increases your heart rate and causes you to sweat) each week. In addition, most adults need muscle-strengthening exercises on 2 or more days a week.   Maintain a healthy weight. The body mass index (BMI) is a screening tool to identify possible weight problems. It provides an estimate of body fat based on height and weight. Your caregiver can help determine your BMI, and can help you achieve or maintain a healthy weight. For adults 20 years and older:  A BMI below 18.5 is considered underweight.  A BMI of 18.5 to 24.9 is normal.  A BMI of 25 to 29.9 is considered overweight.  A BMI of 30 and above is considered obese.  Maintain normal blood lipids and cholesterol by exercising and minimizing your intake of saturated fat. Eat a balanced diet with plenty of fruits and vegetables. Blood tests for lipids and cholesterol should begin at age 61 and be repeated every 5 years. If your lipid or cholesterol levels are high, you are over 50, or you are a high risk for heart  disease, you may need your cholesterol levels checked more frequently.Ongoing high lipid and cholesterol levels should be treated with medicines if diet and exercise are not effective.  If you smoke, find out from your caregiver how to quit. If you do not use tobacco, do not start.  Lung cancer screening is recommended for adults aged 33 80 years who are at high risk for developing lung cancer because of a history of smoking. Yearly low-dose computed tomography (CT) is recommended for people who have at least a 30-pack-year history of smoking and are a current smoker or have quit within the past 15 years. A pack year of smoking is smoking an average of 1 pack of cigarettes a day for 1 year (for example: 1 pack a day for 30 years or 2 packs a day for 15 years). Yearly screening should continue until the smoker has stopped smoking for at least 15 years. Yearly screening should also be stopped for people who develop a health problem that would prevent them from having lung cancer treatment.  If you are pregnant, do not drink alcohol. If you are breastfeeding, be very cautious about drinking alcohol. If you are not pregnant and choose to drink alcohol, do not exceed 1 drink per day. One drink is considered to be 12 ounces (355 mL) of beer, 5 ounces (148 mL) of wine, or 1.5 ounces (44 mL) of liquor.  Avoid use of street drugs. Do not share needles with anyone. Ask for help if you need support or instructions about stopping  the use of drugs.  High blood pressure causes heart disease and increases the risk of stroke. Blood pressure should be checked at least every 1 to 2 years. Ongoing high blood pressure should be treated with medicines, if weight loss and exercise are not effective.  If you are 59 to 78 years old, ask your caregiver if you should take aspirin to prevent strokes.  Diabetes screening involves taking a blood sample to check your fasting blood sugar level. This should be done once every 3  years, after age 91, if you are within normal weight and without risk factors for diabetes. Testing should be considered at a younger age or be carried out more frequently if you are overweight and have at least 1 risk factor for diabetes.  Breast cancer screening is essential preventative care for women. You should practice "breast self-awareness." This means understanding the normal appearance and feel of your breasts and may include breast self-examination. Any changes detected, no matter how small, should be reported to a caregiver. Women in their 66s and 30s should have a clinical breast exam (CBE) by a caregiver as part of a regular health exam every 1 to 3 years. After age 101, women should have a CBE every year. Starting at age 100, women should consider having a mammogram (breast X-ray) every year. Women who have a family history of breast cancer should talk to their caregiver about genetic screening. Women at a high risk of breast cancer should talk to their caregiver about having an MRI and a mammogram every year.  Breast cancer gene (BRCA)-related cancer risk assessment is recommended for women who have family members with BRCA-related cancers. BRCA-related cancers include breast, ovarian, tubal, and peritoneal cancers. Having family members with these cancers may be associated with an increased risk for harmful changes (mutations) in the breast cancer genes BRCA1 and BRCA2. Results of the assessment will determine the need for genetic counseling and BRCA1 and BRCA2 testing.  The Pap test is a screening test for cervical cancer. Women should have a Pap test starting at age 57. Between ages 25 and 35, Pap tests should be repeated every 2 years. Beginning at age 37, you should have a Pap test every 3 years as long as the past 3 Pap tests have been normal. If you had a hysterectomy for a problem that was not cancer or a condition that could lead to cancer, then you no longer need Pap tests. If you are  between ages 50 and 76, and you have had normal Pap tests going back 10 years, you no longer need Pap tests. If you have had past treatment for cervical cancer or a condition that could lead to cancer, you need Pap tests and screening for cancer for at least 20 years after your treatment. If Pap tests have been discontinued, risk factors (such as a new sexual partner) need to be reassessed to determine if screening should be resumed. Some women have medical problems that increase the chance of getting cervical cancer. In these cases, your caregiver may recommend more frequent screening and Pap tests.  The human papillomavirus (HPV) test is an additional test that may be used for cervical cancer screening. The HPV test looks for the virus that can cause the cell changes on the cervix. The cells collected during the Pap test can be tested for HPV. The HPV test could be used to screen women aged 44 years and older, and should be used in women of any age  who have unclear Pap test results. After the age of 55, women should have HPV testing at the same frequency as a Pap test.  Colorectal cancer can be detected and often prevented. Most routine colorectal cancer screening begins at the age of 44 and continues through age 20. However, your caregiver may recommend screening at an earlier age if you have risk factors for colon cancer. On a yearly basis, your caregiver may provide home test kits to check for hidden blood in the stool. Use of a small camera at the end of a tube, to directly examine the colon (sigmoidoscopy or colonoscopy), can detect the earliest forms of colorectal cancer. Talk to your caregiver about this at age 86, when routine screening begins. Direct examination of the colon should be repeated every 5 to 10 years through age 13, unless early forms of pre-cancerous polyps or small growths are found.  Hepatitis C blood testing is recommended for all people born from [birth year] through 1963 and any  individual with known risks for hepatitis C.  Practice safe sex. Use condoms and avoid high-risk sexual practices to reduce the spread of sexually transmitted infections (STIs). Sexually active women aged 36 and younger should be checked for Chlamydia, which is a common sexually transmitted infection. Older women with new or multiple partners should also be tested for Chlamydia. Testing for other STIs is recommended if you are sexually active and at increased risk.  Osteoporosis is a disease in which the bones lose minerals and strength with aging. This can result in serious bone fractures. The risk of osteoporosis can be identified using a bone density scan. Women ages 20 and over and women at risk for fractures or osteoporosis should discuss screening with their caregivers. Ask your caregiver whether you should be taking a calcium supplement or vitamin D to reduce the rate of osteoporosis.  Menopause can be associated with physical symptoms and risks. Hormone replacement therapy is available to decrease symptoms and risks. You should talk to your caregiver about whether hormone replacement therapy is right for you.  Use sunscreen. Apply sunscreen liberally and repeatedly throughout the day. You should seek shade when your shadow is shorter than you. Protect yourself by wearing long sleeves, pants, a wide-brimmed hat, and sunglasses year round, whenever you are outdoors.  Notify your caregiver of new moles or changes in moles, especially if there is a change in shape or color. Also notify your caregiver if a mole is larger than the size of a pencil eraser.  Stay current with your immunizations. Document Released: 10/25/2010 Document Revised: 08/06/2012 Document Reviewed: 10/25/2010 [facility] Patient Information 2014 [employer].

## 2014-03-06 NOTE — Addendum Note (Signed)
Addended by: Federico Flake on: 03/06/2014 12:14 PM   Modules accepted: Orders

## 2014-09-08 ENCOUNTER — Ambulatory Visit
Admission: RE | Admit: 2014-09-08 | Discharge: 2014-09-08 | Disposition: A | Discharge status: Home / Self Care | Payer: Medicare Other | Source: Ambulatory Visit

## 2014-09-08 DIAGNOSIS — Z1231 Encounter for screening mammogram for malignant neoplasm of breast: Secondary | ICD-10-CM

## 2014-09-29 ENCOUNTER — Encounter: Payer: Self-pay | Admitting: Internal Medicine

## 2014-09-30 ENCOUNTER — Telehealth: Payer: Self-pay | Admitting: *Deleted

## 2014-09-30 DIAGNOSIS — M8580 Other specified disorders of bone density and structure, unspecified site: Secondary | ICD-10-CM

## 2014-09-30 NOTE — Telephone Encounter (Signed)
-----   Message from Sinclair Grooms sent at 09/29/2014 11:35 AM EDT ----- Regarding: bone density order Patient needs dexa this summer can you please place order. Thx

## 2014-09-30 NOTE — Telephone Encounter (Signed)
Order placed pt will be scheduled

## 2014-10-24 DIAGNOSIS — M8580 Other specified disorders of bone density and structure, unspecified site: Secondary | ICD-10-CM

## 2014-10-24 HISTORY — DX: Other specified disorders of bone density and structure, unspecified site: M85.80

## 2014-10-30 ENCOUNTER — Ambulatory Visit (INDEPENDENT_AMBULATORY_CARE_PROVIDER_SITE_OTHER): Payer: Medicare Other

## 2014-10-30 ENCOUNTER — Other Ambulatory Visit: Payer: Self-pay | Admitting: Gynecology

## 2014-10-30 DIAGNOSIS — Z13820 Encounter for screening for osteoporosis: Secondary | ICD-10-CM

## 2014-10-30 DIAGNOSIS — M8580 Other specified disorders of bone density and structure, unspecified site: Secondary | ICD-10-CM

## 2014-10-31 ENCOUNTER — Encounter: Payer: Self-pay | Admitting: Gynecology

## 2015-03-12 ENCOUNTER — Encounter: Payer: Self-pay | Admitting: Gynecology

## 2015-03-12 ENCOUNTER — Ambulatory Visit (INDEPENDENT_AMBULATORY_CARE_PROVIDER_SITE_OTHER): Payer: Medicare Other | Admitting: Gynecology

## 2015-03-12 ENCOUNTER — Other Ambulatory Visit (HOSPITAL_COMMUNITY)
Admission: RE | Admit: 2015-03-12 | Discharge: 2015-03-12 | Disposition: A | Discharge status: Home / Self Care | Payer: Medicare Other | Source: Ambulatory Visit | Attending: Gynecology | Admitting: Gynecology

## 2015-03-12 VITALS — BP 120/76 | Ht 65.5 in | Wt 133.0 lb

## 2015-03-12 DIAGNOSIS — Z124 Encounter for screening for malignant neoplasm of cervix: Secondary | ICD-10-CM

## 2015-03-12 DIAGNOSIS — Z01419 Encounter for gynecological examination (general) (routine) without abnormal findings: Secondary | ICD-10-CM

## 2015-03-12 DIAGNOSIS — M8580 Other specified disorders of bone density and structure, unspecified site: Secondary | ICD-10-CM | POA: Diagnosis not present

## 2015-03-12 DIAGNOSIS — N952 Postmenopausal atrophic vaginitis: Secondary | ICD-10-CM | POA: Diagnosis not present

## 2015-03-12 NOTE — Patient Instructions (Signed)

## 2015-03-12 NOTE — Progress Notes (Signed)
Megan Molina 09/11/1932 [id number]        81 y.o.  G3P3  For breast and pelvic exam. Several issues noted below.  Past medical history,surgical history, problem list, medications, allergies, family history and social history were all reviewed and documented as reviewed in the EPIC chart.  ROS:  Performed with pertinent positives and negatives included in the history, assessment and plan.   Additional significant findings :  none   Exam: Kim Counsellor Vitals:   03/12/15 0831  BP: 120/76  Height: 5' 5.5" (1.664 m)  Weight: 133 lb (60.328 kg)   General appearance:  Normal affect, orientation and appearance. Skin: Grossly normal HEENT: Without gross lesions.  No cervical or supraclavicular adenopathy. Thyroid normal.  Lungs:  Clear without wheezing, rales or rhonchi Cardiac: RR, without RMG Abdominal:  Soft, nontender, without masses, guarding, rebound, organomegaly or hernia Breasts:  Examined lying and sitting without masses, retractions, discharge or axillary adenopathy. Pelvic:  Ext/BUS/vagina with atrophic changes  Cervix atrophic, flush with the upper vagina.  Pap smear done  Uterus axial to anteverted, normal size, shape and contour, midline and mobile nontender   Adnexa  Without masses or tenderness    Anus and perineum  Normal   Rectovaginal  Normal sphincter tone without palpated masses or tenderness.    Assessment/Plan:  81 y.o. G3P3 female for breast and pelvic exam.   1. Postmenopausal/atrophic genital changes. Without significant hot flushes, night sweats, vaginal dryness. Is not sexually active. No vaginal bleeding. Continue to monitor report any bleeding. 2. Osteopenia. DEXA 2016 T score -2.3. History of alendronate 70 mg 2010 through 2014 for a 5 year course. Currently on drug holiday. Continue to monitor and repeat DEXA in 2 years. 3. Pap smear 2011. Pap smear done today. I reviewed the current screening guidelines and options to stop screening. At this point she  feels more comfortable with screening. 4. Mammography 08/2014. Continue annual mammography when due. SBE monthly reviewed. 5. Colonoscopy 2013. Repeat at their recommended interval. 6. Health maintenance. No routine blood work done as patient has this done at her primary physician's office. Follow up 1 year, sooner as needed.   Anastasio Auerbach MD, 8:58 AM 03/12/2015

## 2015-03-12 NOTE — Addendum Note (Signed)
Addended by: Nelva Nay on: 03/12/2015 09:18 AM   Modules accepted: Orders

## 2015-03-12 NOTE — Addendum Note (Signed)
Addended by: Joaquin Music on: 03/12/2015 03:54 PM   Modules accepted: Orders

## 2015-03-16 LAB — CYTOLOGY - PAP

## 2015-07-07 ENCOUNTER — Other Ambulatory Visit: Payer: Self-pay

## 2015-07-07 DIAGNOSIS — Z1231 Encounter for screening mammogram for malignant neoplasm of breast: Secondary | ICD-10-CM

## 2015-09-10 ENCOUNTER — Ambulatory Visit
Admission: RE | Admit: 2015-09-10 | Discharge: 2015-09-10 | Disposition: A | Discharge status: Home / Self Care | Payer: Medicare Other | Source: Ambulatory Visit

## 2015-09-10 DIAGNOSIS — Z1231 Encounter for screening mammogram for malignant neoplasm of breast: Secondary | ICD-10-CM

## 2016-03-14 ENCOUNTER — Encounter: Payer: Medicare Other | Admitting: Gynecology

## 2016-05-12 ENCOUNTER — Encounter: Payer: Medicare Other | Admitting: Gynecology

## 2016-05-26 ENCOUNTER — Other Ambulatory Visit: Payer: Self-pay | Admitting: Internal Medicine

## 2016-05-26 DIAGNOSIS — Z1231 Encounter for screening mammogram for malignant neoplasm of breast: Secondary | ICD-10-CM

## 2016-06-02 ENCOUNTER — Encounter: Payer: Self-pay | Admitting: Gynecology

## 2016-06-02 ENCOUNTER — Ambulatory Visit (INDEPENDENT_AMBULATORY_CARE_PROVIDER_SITE_OTHER): Payer: Medicare Other | Admitting: Gynecology

## 2016-06-02 VITALS — BP 120/74 | Ht 65.5 in | Wt 133.0 lb

## 2016-06-02 DIAGNOSIS — M899 Disorder of bone, unspecified: Secondary | ICD-10-CM

## 2016-06-02 DIAGNOSIS — N952 Postmenopausal atrophic vaginitis: Secondary | ICD-10-CM

## 2016-06-02 DIAGNOSIS — Z01411 Encounter for gynecological examination (general) (routine) with abnormal findings: Secondary | ICD-10-CM | POA: Diagnosis not present

## 2016-06-02 NOTE — Patient Instructions (Signed)
Followup for bone density as scheduled. 

## 2016-06-02 NOTE — Progress Notes (Signed)
    ONYX STEELEY [birth year] [id number]        83 y.o.  G3P3 for annual exam.    Past medical history,surgical history, problem list, medications, allergies, family history and social history were all reviewed and documented as reviewed in the EPIC chart.  ROS:  Performed with pertinent positives and negatives included in the history, assessment and plan.   Additional significant findings :  None   Exam: Caryn Bee assistant Vitals:   06/02/16 1052  BP: 120/74  Weight: 133 lb (60.3 kg)  Height: 5' 5.5" (1.664 m)   Body mass index is 21.8 kg/m.  General appearance:  Normal affect, orientation and appearance. Skin: Grossly normal HEENT: Without gross lesions.  No cervical or supraclavicular adenopathy. Thyroid normal.  Lungs:  Clear without wheezing, rales or rhonchi Cardiac: RR, without RMG Abdominal:  Soft, nontender, without masses, guarding, rebound, organomegaly or hernia Breasts:  Examined lying and sitting without masses, retractions, discharge or axillary adenopathy. Pelvic:  Ext, BUS, Vagina with atrophic changes. Admits 1 finger.  Cervix with atrophic changes, flush with upper vagina  Uterus anteverted, normal size, shape and contour, midline and mobile nontender   Adnexa without masses or tenderness    Anus and perineum normal   Rectovaginal normal sphincter tone without palpated masses or tenderness.    Assessment/Plan:  83 y.o. G3P3 female for annual exam.   1. Postmenopausal/atrophic genital changes. No significant hot flushes, night sweats, vaginal dryness or any bleeding. Continue to monitor report any issues or bleeding. 2. Osteopenia. DEXA 2016 T score -2.3. Alendronate 70 mg 2010 through 2014 for a 5 year course. Currently on drug-free holiday. Schedule DEXA now at 2 year interval. 3. Pap smear 2016. No Pap smear done today. No history of significant abnormal Pap smears previously. Options to stop screening per current screening guidelines based on age  discussed. 4. Mammography 08/2015. I reminded the patient to schedule her mammogram in May. SBE monthly reviewed. 5. Colonoscopy 2013. Repeat at their recommended interval. 6. Health maintenance. No routine lab work done as patient does this elsewhere. Follow up 1 year, sooner as needed.   Anastasio Auerbach MD, 11:19 AM 06/02/2016

## 2016-07-19 ENCOUNTER — Ambulatory Visit (INDEPENDENT_AMBULATORY_CARE_PROVIDER_SITE_OTHER): Payer: Medicare Other

## 2016-07-19 ENCOUNTER — Other Ambulatory Visit: Payer: Self-pay | Admitting: Gynecology

## 2016-07-19 DIAGNOSIS — M8589 Other specified disorders of bone density and structure, multiple sites: Secondary | ICD-10-CM

## 2016-07-19 DIAGNOSIS — Z780 Asymptomatic menopausal state: Secondary | ICD-10-CM

## 2016-07-19 DIAGNOSIS — M899 Disorder of bone, unspecified: Secondary | ICD-10-CM

## 2016-07-20 ENCOUNTER — Encounter: Payer: Self-pay | Admitting: Gynecology

## 2016-09-13 ENCOUNTER — Ambulatory Visit
Admission: RE | Admit: 2016-09-13 | Discharge: 2016-09-13 | Disposition: A | Discharge status: Home / Self Care | Payer: Medicare Other | Source: Ambulatory Visit | Attending: Internal Medicine | Admitting: Internal Medicine

## 2016-09-13 DIAGNOSIS — Z1231 Encounter for screening mammogram for malignant neoplasm of breast: Secondary | ICD-10-CM

## 2017-06-09 ENCOUNTER — Encounter: Payer: Medicare Other | Admitting: Gynecology

## 2017-06-23 ENCOUNTER — Ambulatory Visit: Payer: Medicare Other | Admitting: Gynecology

## 2017-06-23 ENCOUNTER — Encounter: Payer: Self-pay | Admitting: Gynecology

## 2017-06-23 VITALS — BP 120/76 | Ht 65.0 in | Wt 133.0 lb

## 2017-06-23 DIAGNOSIS — N952 Postmenopausal atrophic vaginitis: Secondary | ICD-10-CM

## 2017-06-23 DIAGNOSIS — Z01411 Encounter for gynecological examination (general) (routine) with abnormal findings: Secondary | ICD-10-CM

## 2017-06-23 DIAGNOSIS — M810 Age-related osteoporosis without current pathological fracture: Secondary | ICD-10-CM

## 2017-06-23 NOTE — Patient Instructions (Signed)
Follow-up in 1 year for annual exam, sooner if any issues. 

## 2017-06-23 NOTE — Progress Notes (Signed)
    BRIELLAH BAIK 1933/01/29 [id number]        84 y.o.  G3P3 for annual gynecologic exam.    Past medical history,surgical history, problem list, medications, allergies, family history and social history were all reviewed and documented as reviewed in the EPIC chart.  ROS:  Performed with pertinent positives and negatives included in the history, assessment and plan.   Additional significant findings : None   Exam: Caryn Bee assistant Vitals:   06/23/17 1414  BP: 120/76  Weight: 133 lb (60.3 kg)  Height: 5\' 5"  (1.651 m)   Body mass index is 22.13 kg/m.  General appearance:  Normal affect, orientation and appearance. Skin: Grossly normal HEENT: Without gross lesions.  No cervical or supraclavicular adenopathy. Thyroid normal.  Lungs:  Clear without wheezing, rales or rhonchi Cardiac: RR, without RMG Abdominal:  Soft, nontender, without masses, guarding, rebound, organomegaly or hernia Breasts:  Examined lying and sitting without masses, retractions, discharge or axillary adenopathy. Pelvic:  Ext, BUS, Vagina: With atrophic changes  Cervix: Flush with upper vagina  Uterus: Anteverted, normal size, shape and contour, midline and mobile nontender   Adnexa: Without masses or tenderness    Anus and perineum: Normal   Rectovaginal: Normal sphincter tone without palpated masses or tenderness.    Assessment/Plan:  84 y.o. G3P3 female for annual gynecologic exam.   1. Postmenopausal/atrophic genital changes.  No significant hot flushes, night sweats, vaginal dryness or any vaginal bleeding.  Report any issues or bleeding. 2. Osteoporosis.  DEXA 06/2016 T score -2.4 stable from prior DEXA.  Alendronate 2010 through 2014 for 5-year course.  Now on drug-free holiday.  Plan repeat DEXA at 2-year interval. 3. Mammography 08/2016.  Continue with annual mammography when due.  SBE monthly reviewed.  Breast exam normal today. 4. Pap smear 2016.  No Pap smear done today.  No history of significant  abnormal Pap smears.  Per current screening guidelines we both agree to stop screening based on age. 5. Colonoscopy 2013.  Repeat at their recommended interval. 6. Health maintenance.  No routine lab work done as patient does this elsewhere.  Follow-up 1 year, sooner as needed.   Anastasio Auerbach MD, 2:46 PM 06/23/2017

## 2017-07-07 ENCOUNTER — Other Ambulatory Visit: Payer: Self-pay | Admitting: Internal Medicine

## 2017-07-07 DIAGNOSIS — Z1231 Encounter for screening mammogram for malignant neoplasm of breast: Secondary | ICD-10-CM

## 2017-09-14 ENCOUNTER — Ambulatory Visit
Admission: RE | Admit: 2017-09-14 | Discharge: 2017-09-14 | Disposition: A | Discharge status: Home / Self Care | Payer: Medicare Other | Source: Ambulatory Visit | Attending: Internal Medicine | Admitting: Internal Medicine

## 2017-09-14 DIAGNOSIS — Z1231 Encounter for screening mammogram for malignant neoplasm of breast: Secondary | ICD-10-CM

## 2018-06-27 ENCOUNTER — Other Ambulatory Visit: Payer: Self-pay | Admitting: Internal Medicine

## 2018-06-27 DIAGNOSIS — Z1231 Encounter for screening mammogram for malignant neoplasm of breast: Secondary | ICD-10-CM

## 2018-06-29 ENCOUNTER — Encounter: Payer: Medicare Other | Admitting: Gynecology

## 2018-08-14 ENCOUNTER — Encounter: Payer: Medicare Other | Admitting: Gynecology

## 2018-09-20 ENCOUNTER — Ambulatory Visit: Payer: Medicare Other

## 2018-11-19 ENCOUNTER — Encounter: Payer: Medicare Other | Admitting: Gynecology

## 2018-11-28 ENCOUNTER — Ambulatory Visit: Payer: Medicare Other

## 2018-12-04 ENCOUNTER — Ambulatory Visit
Admission: RE | Admit: 2018-12-04 | Discharge: 2018-12-04 | Disposition: A | Discharge status: Home / Self Care | Payer: Medicare Other | Source: Ambulatory Visit | Attending: Internal Medicine | Admitting: Internal Medicine

## 2018-12-04 ENCOUNTER — Other Ambulatory Visit: Payer: Self-pay

## 2018-12-04 DIAGNOSIS — Z1231 Encounter for screening mammogram for malignant neoplasm of breast: Secondary | ICD-10-CM

## 2019-01-31 ENCOUNTER — Encounter: Payer: Self-pay | Admitting: Gynecology

## 2019-02-22 ENCOUNTER — Encounter: Payer: Medicare Other | Admitting: Gynecology

## 2019-02-22 ENCOUNTER — Other Ambulatory Visit (HOSPITAL_COMMUNITY): Payer: Self-pay | Admitting: Internal Medicine

## 2019-02-22 DIAGNOSIS — I493 Ventricular premature depolarization: Secondary | ICD-10-CM

## 2019-02-25 ENCOUNTER — Ambulatory Visit (HOSPITAL_COMMUNITY)
Admission: RE | Admit: 2019-02-25 | Discharge: 2019-02-25 | Disposition: A | Discharge status: Home / Self Care | Payer: Medicare Other | Source: Ambulatory Visit | Attending: Internal Medicine | Admitting: Internal Medicine

## 2019-02-25 ENCOUNTER — Other Ambulatory Visit: Payer: Self-pay

## 2019-02-25 DIAGNOSIS — R9431 Abnormal electrocardiogram [ECG] [EKG]: Secondary | ICD-10-CM | POA: Diagnosis not present

## 2019-02-25 DIAGNOSIS — I493 Ventricular premature depolarization: Secondary | ICD-10-CM | POA: Insufficient documentation

## 2019-02-25 DIAGNOSIS — I081 Rheumatic disorders of both mitral and tricuspid valves: Secondary | ICD-10-CM | POA: Insufficient documentation

## 2019-02-25 NOTE — Progress Notes (Signed)
  Echocardiogram 2D Echocardiogram has been performed.  Megan Molina 02/25/2019, 1:41 PM

## 2019-03-04 ENCOUNTER — Telehealth: Payer: Self-pay

## 2019-03-04 NOTE — Telephone Encounter (Signed)
NOTES ON FILE FROM [facility] [phone], SENT REFERRAL TO [facility]

## 2019-03-12 ENCOUNTER — Other Ambulatory Visit: Payer: Self-pay | Admitting: *Deleted

## 2019-03-12 DIAGNOSIS — I493 Ventricular premature depolarization: Secondary | ICD-10-CM

## 2019-03-27 ENCOUNTER — Encounter: Payer: Self-pay | Admitting: *Deleted

## 2019-03-27 NOTE — Progress Notes (Signed)
Patient ID: Megan Molina, female   DOB: December 12, 1932, 86 y.o.   MRN: [id number] Patient requested monitor be sent out first week of December.  03/27/19 patient enrolled for [employer] to mail a 14 day ZIO XT long term holter monitor to be mailed to her home.

## 2019-05-01 ENCOUNTER — Ambulatory Visit (INDEPENDENT_AMBULATORY_CARE_PROVIDER_SITE_OTHER): Payer: Medicare PPO

## 2019-05-01 DIAGNOSIS — I493 Ventricular premature depolarization: Secondary | ICD-10-CM | POA: Diagnosis not present

## 2019-05-11 ENCOUNTER — Ambulatory Visit: Payer: Medicare Other | Attending: Internal Medicine

## 2019-05-11 DIAGNOSIS — Z23 Encounter for immunization: Secondary | ICD-10-CM | POA: Insufficient documentation

## 2019-05-11 NOTE — Progress Notes (Signed)
   Covid-19 Vaccination Clinic  Name:  Megan Molina    MRN: [id number] DOB: 1933/01/10  05/11/2019  [name] was observed post Covid-19 immunization for 15 minutes without incidence. She was provided with Vaccine Information Sheet and instruction to access the V-Safe system.   [name] was instructed to call 911 with any severe reactions post vaccine: Marland Kitchen Difficulty breathing  . Swelling of your face and throat  . A fast heartbeat  . A bad rash all over your body  . Dizziness and weakness    Immunizations Administered    Name Date Dose VIS Date Route   Pfizer COVID-19 Vaccine 05/11/2019 11:19 AM 0.3 mL 04/05/2019 Intramuscular   Manufacturer: [employer], [employer]   Lot: [id number]   [place]: [id number]

## 2019-05-23 ENCOUNTER — Ambulatory Visit: Payer: Medicare Other

## 2019-05-30 ENCOUNTER — Ambulatory Visit: Payer: Medicare PPO | Attending: Internal Medicine

## 2019-05-30 DIAGNOSIS — Z23 Encounter for immunization: Secondary | ICD-10-CM | POA: Insufficient documentation

## 2019-05-30 NOTE — Progress Notes (Signed)
   Covid-19 Vaccination Clinic  Name:  Megan Molina    MRN: [id number] DOB: Dec 09, 1932  05/30/2019  [name] was observed post Covid-19 immunization for 15 minutes without incidence. She was provided with Vaccine Information Sheet and instruction to access the V-Safe system.   [name] was instructed to call 911 with any severe reactions post vaccine: Marland Kitchen Difficulty breathing  . Swelling of your face and throat  . A fast heartbeat  . A bad rash all over your body  . Dizziness and weakness    Immunizations Administered    Name Date Dose VIS Date Route   Pfizer COVID-19 Vaccine 05/30/2019  8:53 AM 0.3 mL 04/05/2019 Intramuscular   Manufacturer: [place]   Lot: [id number]   [place]: [id number]

## 2019-06-07 NOTE — Progress Notes (Signed)
Cardiology Office Note   Date:  06/11/2019   ID:  Megan Molina, DOB November 06, 1932, MRN [id number]  PCP:  Audley Hose, MD  Cardiologist:  Eleanor Dimichele Martinique, MD   Chief Complaint  Patient presents with  . Palpitations      History of Present Illness: Megan Molina is a 86 y.o. female who is seen at the request of Dr Maia Petties for evaluation of cardiac arrhythmia. She has a history of HLD and hypothyroidism. An Echocardiogram was done in November and was normal. Event monitor was placed in January and is reviewed today.    She reports she was seen for a physical 2 months ago and was noted to have PVCs. She was completely without symptoms and denies any palpitations, dizziness, syncope, chest pain, or SOB. She is active and feels she is in very good health. She is recently widowed in December.     Past Medical History:  Diagnosis Date  . Hypercholesteremia   . Hypothyroid   . Osteopenia 06/2016   T score -2.4 stable from prior DEXA.    Past Surgical History:  Procedure Laterality Date  . ABCESS DRAINAGE     INTESTINAL ABCESS  . APPENDECTOMY    . BREAST BIOPSY Right   . BREAST SURGERY     BIOPSY  . COLONOSCOPY  2013     Current Outpatient Medications  Medication Sig Dispense Refill  . Ascorbic Acid (VITAMIN C PO) Take by mouth.      Marland Kitchen aspirin 81 MG chewable tablet aspirin 81 mg chewable tablet  Chew 1 tablet every day by oral route.    Marland Kitchen aspirin 81 MG tablet Take 81 mg by mouth daily.    . Calcium Carbonate (CALTRATE 600 PO) Take by mouth.      . Cholecalciferol (VITAMIN D PO) Take by mouth.      . Cyanocobalamin (VITAMIN B-12 PO) Take by mouth.      . dorzolamide (TRUSOPT) 2 % ophthalmic solution dorzolamide 2 % eye drops  INSTILL 1 DROP IN BOTH EYES TWICE DAILY    . fish oil-omega-3 fatty acids 1000 MG capsule Take by mouth daily.    Marland Kitchen levothyroxine (SYNTHROID) 50 MCG tablet levothyroxine 50 mcg tablet  1 po on Tuesday, Thursday, Saturday and Sunday    . Levothyroxine  Sodium (SYNTHROID PO) Take 75 mcg by mouth daily.     . rosuvastatin (CRESTOR) 5 MG tablet rosuvastatin 5 mg tablet  Take 1 tablet every day by oral route in the evening for 90 days.    . Rosuvastatin Calcium (CRESTOR PO) Take 5 mg by mouth. Takes half tablet daily     No current facility-administered medications for this visit.    Allergies:   Sulfa antibiotics    Social History:  The patient  reports that she quit smoking about 31 years ago. She has never used smokeless tobacco. She reports current alcohol use of about 7.0 standard drinks of alcohol per week. She reports that she does not use drugs.   Family History:  The patient's family history includes Pancreatic cancer in her mother.    ROS:  Please see the history of present illness.   Otherwise, review of systems are positive for none.   All other systems are reviewed and negative.    PHYSICAL EXAM: VS:  BP 138/76   Pulse 82   Temp (!) 97.1 F (36.2 C)   Ht 5\' 6"  (1.676 m)   Wt 113 lb 12.8 oz (  51.6 kg)   SpO2 97%   BMI 18.37 kg/m  , BMI Body mass index is 18.37 kg/m. GEN: Well nourished, well developed, in no acute distress - appears younger than stated age 45: normal  Neck: no JVD, carotid bruits, or masses Cardiac: RRR; no murmurs, rubs, or gallops,no edema  Respiratory:  clear to auscultation bilaterally, normal work of breathing GI: soft, nontender, nondistended, + BS MS: no deformity or atrophy  Skin: warm and dry, no rash Neuro:  Strength and sensation are intact Psych: euthymic mood, full affect   EKG:  EKG is ordered today. The ekg ordered today demonstrates NSR with PACs. Rate 82. Otherwise normal. I have personally reviewed and interpreted this study.    Recent Labs: No results found for requested labs within last 8760 hours.    Lipid Panel No results found for: CHOL, TRIG, HDL, CHOLHDL, VLDL, LDLCALC, LDLDIRECT   Dated 08/25/16: cholesterol 203, triglycerides 74, HDL 71, creatinine, TSH, ALT  normal Dated 06/03/18: CMET normal. CBC normal. TSH 0.83, Free T4 1.8.   Wt Readings from Last 3 Encounters:  06/11/19 113 lb 12.8 oz (51.6 kg)  06/23/17 133 lb (60.3 kg)  06/02/16 133 lb (60.3 kg)      Other studies Reviewed: Additional studies/ records that were reviewed today include:   Echo 02/25/19: IMPRESSIONS    1. Left ventricular ejection fraction, by visual estimation, is 65 to  70%. The left ventricle has normal function. There is no left ventricular  hypertrophy.  2. Left ventricular diastolic parameters are consistent with Grade I  diastolic dysfunction (impaired relaxation).  3. Global right ventricle has normal systolic function.The right  ventricular size is normal. No increase in right ventricular wall  thickness.  4. Left atrial size was normal.  5. Right atrial size was normal.  6. Trivial pericardial effusion is present.  7. The mitral valve is normal in structure. Trace mitral valve  regurgitation.  8. The tricuspid valve is normal in structure. Tricuspid valve  regurgitation is trivial.  9. The aortic valve is normal in structure. Aortic valve regurgitation is  not visualized.  10. The pulmonic valve was not well visualized. Pulmonic valve  regurgitation is not visualized.  11. Normal pulmonary artery systolic pressure.    ASSESSMENT AND PLAN:  1.  Cardiac arrhythmia. Event monitor demonstrates frequent PACs with short bursts of PAT. Longest 16 seconds. She has rare PVCs and 3 short runs of NSVT. These are completely asymptomatic. Her cardiac exam is normal. Echo is normal. Labs are all good. Based on these findings I do not feel she needs any treatment for her ectopy. Her risk is low. If she were to develop symptoms we could try her on a beta blocker but in the absence of symptoms there is no indication. I will follow up PRN 2. Hypothyroidism - TFTs are normal.    Current medicines are reviewed at length with the patient today.  The patient has  no concerns regarding medicines.  The following changes have been made:  no change  Labs/ tests ordered today include:  No orders of the defined types were placed in this encounter.    Disposition:   FU with me PRN  Signed, Carlen Rebuck Martinique, MD  06/11/2019 2:12 PM    [facility] [address] [place], [place], [address] Phone [phone], Fax [phone]

## 2019-06-11 ENCOUNTER — Other Ambulatory Visit: Payer: Self-pay

## 2019-06-11 ENCOUNTER — Encounter: Payer: Self-pay | Admitting: Cardiology

## 2019-06-11 ENCOUNTER — Ambulatory Visit: Payer: Medicare PPO | Admitting: Cardiology

## 2019-06-11 VITALS — BP 138/76 | HR 82 | Temp 97.1°F | Ht 66.0 in | Wt 113.8 lb

## 2019-06-11 DIAGNOSIS — I493 Ventricular premature depolarization: Secondary | ICD-10-CM

## 2019-06-11 DIAGNOSIS — I491 Atrial premature depolarization: Secondary | ICD-10-CM | POA: Diagnosis not present

## 2019-06-11 DIAGNOSIS — I471 Supraventricular tachycardia: Secondary | ICD-10-CM

## 2019-07-03 ENCOUNTER — Encounter: Payer: Medicare Other | Admitting: Obstetrics & Gynecology

## 2019-08-20 ENCOUNTER — Other Ambulatory Visit: Payer: Self-pay

## 2019-08-21 ENCOUNTER — Encounter: Payer: Self-pay | Admitting: Obstetrics & Gynecology

## 2019-08-21 ENCOUNTER — Ambulatory Visit: Payer: Medicare PPO | Admitting: Obstetrics & Gynecology

## 2019-08-21 VITALS — BP 128/70 | Ht 65.0 in | Wt 128.0 lb

## 2019-08-21 DIAGNOSIS — Z01419 Encounter for gynecological examination (general) (routine) without abnormal findings: Secondary | ICD-10-CM

## 2019-08-21 DIAGNOSIS — M810 Age-related osteoporosis without current pathological fracture: Secondary | ICD-10-CM

## 2019-08-21 DIAGNOSIS — Z780 Asymptomatic menopausal state: Secondary | ICD-10-CM

## 2019-08-21 NOTE — Patient Instructions (Signed)
1. Well female exam with routine gynecological exam Normal gynecologic exam in menopause.  No indication to repeat a Pap test.  Breast exam normal.  Will schedule a screening mammogram August 2021.  Last colonoscopy 2013, will not repeat.  Health labs with family physician.  Good body mass index at 21.3.  Continue with fitness and healthy nutrition.  2. Postmenopause Well on no hormone replacement therapy.  No postmenopausal bleeding.  3. Age-related osteoporosis without current pathological fracture Osteoporosis with last bone density in 2018.  On alendronate holiday since then.  We will repeat a bone density here now.  Continue with vitamin D supplements, calcium intake of 1200 mg daily and regular weightbearing physical activities. - DG Bone Density; Future  Megan Molina, it was a pleasure meeting you today!

## 2019-08-21 NOTE — Progress Notes (Signed)
Megan Molina 1932-07-11 [id number]   History:    86 y.o. G3P3L3 Widowed x 03/2019.  English as a second language teacher.  RP:  Established patient presenting for annual gyn exam   HPI: Postmenopause, well on no HRT.  No PMB.  No pelvic pain.  Abstinent.  Urine/BMs normal.  Breasts normal.  Osteoporosis, last BD 2018.  Alendronate holiday since then.  BMI 21.30.  Health Labs with Fam MD.  Colono 2013.    Past medical history,surgical history, family history and social history were all reviewed and documented in the EPIC chart.  Gynecologic History No LMP recorded. Patient is postmenopausal.  Obstetric History OB History  Gravida Para Term Preterm AB Living  3 3       3   SAB TAB Ectopic Multiple Live Births               # Outcome Date GA Lbr Len/2nd Weight Sex Delivery Anes PTL Lv  3 Para           2 Para           1 Para              ROS: A ROS was performed and pertinent positives and negatives are included in the history.  GENERAL: No fevers or chills. HEENT: No change in vision, no earache, sore throat or sinus congestion. NECK: No pain or stiffness. CARDIOVASCULAR: No chest pain or pressure. No palpitations. PULMONARY: No shortness of breath, cough or wheeze. GASTROINTESTINAL: No abdominal pain, nausea, vomiting or diarrhea, melena or bright red blood per rectum. GENITOURINARY: No urinary frequency, urgency, hesitancy or dysuria. MUSCULOSKELETAL: No joint or muscle pain, no back pain, no recent trauma. DERMATOLOGIC: No rash, no itching, no lesions. ENDOCRINE: No polyuria, polydipsia, no heat or cold intolerance. No recent change in weight. HEMATOLOGICAL: No anemia or easy bruising or bleeding. NEUROLOGIC: No headache, seizures, numbness, tingling or weakness. PSYCHIATRIC: No depression, no loss of interest in normal activity or change in sleep pattern.     Exam:   BP 128/70   Ht 5\' 5"  (1.651 m)   Wt 128 lb (58.1 kg)   BMI 21.30 kg/m   Body mass index is 21.3 kg/m.  General appearance :  Well developed well nourished female. No acute distress HEENT: Eyes: no retinal hemorrhage or exudates,  Neck supple, trachea midline, no carotid bruits, no thyroidmegaly Lungs: Clear to auscultation, no rhonchi or wheezes, or rib retractions  Heart: Regular rate and rhythm, no murmurs or gallops Breast:Examined in sitting and supine position were symmetrical in appearance, no palpable masses or tenderness,  no skin retraction, no nipple inversion, no nipple discharge, no skin discoloration, no axillary or supraclavicular lymphadenopathy Abdomen: no palpable masses or tenderness, no rebound or guarding Extremities: no edema or skin discoloration or tenderness  Pelvic: Vulva: Normal             Vagina: No gross lesions or discharge  Cervix: No gross lesions or discharge  Uterus  AV, normal size, shape and consistency, non-tender and mobile  Adnexa  Without masses or tenderness  Anus: Normal   Assessment/Plan:  86 y.o. female for annual exam   1. Well female exam with routine gynecological exam Normal gynecologic exam in menopause.  No indication to repeat a Pap test.  Breast exam normal.  Will schedule a screening mammogram August 2021.  Last colonoscopy 2013, will not repeat.  Health labs with family physician.  Good body mass index at 21.3.  Continue with  fitness and healthy nutrition.  2. Postmenopause Well on no hormone replacement therapy.  No postmenopausal bleeding.  3. Age-related osteoporosis without current pathological fracture Osteoporosis with last bone density in 2018.  On alendronate holiday since then.  We will repeat a bone density here now.  Continue with vitamin D supplements, calcium intake of 1200 mg daily and regular weightbearing physical activities. - DG Bone Density; Future  Princess Bruins MD, 8:41 AM 08/21/2019

## 2019-09-01 ENCOUNTER — Emergency Department (HOSPITAL_COMMUNITY): Payer: Medicare PPO

## 2019-09-01 ENCOUNTER — Other Ambulatory Visit: Payer: Self-pay

## 2019-09-01 ENCOUNTER — Encounter (HOSPITAL_COMMUNITY): Payer: Self-pay

## 2019-09-01 ENCOUNTER — Inpatient Hospital Stay (HOSPITAL_COMMUNITY)
Admission: EM | Admit: 2019-09-01 | Discharge: 2019-09-04 | DRG: 352 | Disposition: A | Discharge status: Home Health | Payer: Medicare PPO | Attending: Physician Assistant | Admitting: Physician Assistant

## 2019-09-01 DIAGNOSIS — D179 Benign lipomatous neoplasm, unspecified: Secondary | ICD-10-CM | POA: Diagnosis present

## 2019-09-01 DIAGNOSIS — R109 Unspecified abdominal pain: Secondary | ICD-10-CM | POA: Diagnosis present

## 2019-09-01 DIAGNOSIS — K4090 Unilateral inguinal hernia, without obstruction or gangrene, not specified as recurrent: Secondary | ICD-10-CM | POA: Diagnosis present

## 2019-09-01 DIAGNOSIS — Z79899 Other long term (current) drug therapy: Secondary | ICD-10-CM | POA: Diagnosis not present

## 2019-09-01 DIAGNOSIS — E039 Hypothyroidism, unspecified: Secondary | ICD-10-CM | POA: Diagnosis present

## 2019-09-01 DIAGNOSIS — Z20822 Contact with and (suspected) exposure to covid-19: Secondary | ICD-10-CM | POA: Diagnosis present

## 2019-09-01 DIAGNOSIS — Z87891 Personal history of nicotine dependence: Secondary | ICD-10-CM

## 2019-09-01 DIAGNOSIS — Z7982 Long term (current) use of aspirin: Secondary | ICD-10-CM

## 2019-09-01 DIAGNOSIS — Z800 Family history of malignant neoplasm of digestive organs: Secondary | ICD-10-CM | POA: Diagnosis not present

## 2019-09-01 DIAGNOSIS — N949 Unspecified condition associated with female genital organs and menstrual cycle: Secondary | ICD-10-CM

## 2019-09-01 DIAGNOSIS — H409 Unspecified glaucoma: Secondary | ICD-10-CM | POA: Diagnosis present

## 2019-09-01 DIAGNOSIS — K56609 Unspecified intestinal obstruction, unspecified as to partial versus complete obstruction: Secondary | ICD-10-CM

## 2019-09-01 DIAGNOSIS — E7800 Pure hypercholesterolemia, unspecified: Secondary | ICD-10-CM | POA: Diagnosis present

## 2019-09-01 DIAGNOSIS — Z79890 Hormone replacement therapy (postmenopausal): Secondary | ICD-10-CM | POA: Diagnosis not present

## 2019-09-01 DIAGNOSIS — K4130 Unilateral femoral hernia, with obstruction, without gangrene, not specified as recurrent: Secondary | ICD-10-CM | POA: Diagnosis present

## 2019-09-01 DIAGNOSIS — K4190 Unilateral femoral hernia, without obstruction or gangrene, not specified as recurrent: Secondary | ICD-10-CM | POA: Diagnosis present

## 2019-09-01 DIAGNOSIS — G43909 Migraine, unspecified, not intractable, without status migrainosus: Secondary | ICD-10-CM | POA: Diagnosis present

## 2019-09-01 LAB — URINALYSIS, ROUTINE W REFLEX MICROSCOPIC
Bilirubin Urine: NEGATIVE
Glucose, UA: NEGATIVE mg/dL
Hgb urine dipstick: NEGATIVE
Ketones, ur: 5 mg/dL — AB
Leukocytes,Ua: NEGATIVE
Nitrite: NEGATIVE
Protein, ur: NEGATIVE mg/dL
Specific Gravity, Urine: >1.046 — ABNORMAL HIGH (ref 1.005–1.030)
pH: 7 (ref 5.0–8.0)

## 2019-09-01 LAB — CBC WITH DIFFERENTIAL/PLATELET
Abs Immature Granulocytes: 0.03 10*3/uL (ref 0.00–0.07)
Basophils Absolute: 0 10*3/uL (ref 0.0–0.1)
Basophils Relative: 0 %
Eosinophils Absolute: 0 10*3/uL (ref 0.0–0.5)
Eosinophils Relative: 0 %
HCT: 48.2 % — ABNORMAL HIGH (ref 36.0–46.0)
Hemoglobin: 15.7 g/dL — ABNORMAL HIGH (ref 12.0–15.0)
Immature Granulocytes: 0 %
Lymphocytes Relative: 14 %
Lymphs Abs: 1.5 10*3/uL (ref 0.7–4.0)
MCH: 31.5 pg (ref 26.0–34.0)
MCHC: 32.6 g/dL (ref 30.0–36.0)
MCV: 96.6 fL (ref 80.0–100.0)
Monocytes Absolute: 0.4 10*3/uL (ref 0.1–1.0)
Monocytes Relative: 3 %
Neutro Abs: 8.7 10*3/uL — ABNORMAL HIGH (ref 1.7–7.7)
Neutrophils Relative %: 83 %
Platelets: 248 10*3/uL (ref 150–400)
RBC: 4.99 MIL/uL (ref 3.87–5.11)
RDW: 12.5 % (ref 11.5–15.5)
WBC: 10.6 10*3/uL — ABNORMAL HIGH (ref 4.0–10.5)
nRBC: 0 % (ref 0.0–0.2)

## 2019-09-01 LAB — COMPREHENSIVE METABOLIC PANEL
ALT: 21 U/L (ref 0–44)
AST: 24 U/L (ref 15–41)
Albumin: 4.3 g/dL (ref 3.5–5.0)
Alkaline Phosphatase: 50 U/L (ref 38–126)
Anion gap: 13 (ref 5–15)
BUN: 17 mg/dL (ref 8–23)
CO2: 23 mmol/L (ref 22–32)
Calcium: 9.7 mg/dL (ref 8.9–10.3)
Chloride: 101 mmol/L (ref 98–111)
Creatinine, Ser: 0.69 mg/dL (ref 0.44–1.00)
GFR calc Af Amer: >60 mL/min (ref >60)
GFR calc non Af Amer: >60 mL/min (ref >60)
Glucose, Bld: 160 mg/dL — ABNORMAL HIGH (ref 70–99)
Potassium: 3.9 mmol/L (ref 3.5–5.1)
Sodium: 137 mmol/L (ref 135–145)
Total Bilirubin: 1.6 mg/dL — ABNORMAL HIGH (ref 0.3–1.2)
Total Protein: 7.3 g/dL (ref 6.5–8.1)

## 2019-09-01 LAB — RESPIRATORY PANEL BY RT PCR (FLU A&B, COVID)
Influenza A by PCR: NEGATIVE
Influenza B by PCR: NEGATIVE
SARS Coronavirus 2 by RT PCR: NEGATIVE

## 2019-09-01 LAB — LACTIC ACID, PLASMA
Lactic Acid, Venous: 2.4 mmol/L (ref 0.5–1.9)
Lactic Acid, Venous: 1.2 mmol/L (ref 0.5–1.9)

## 2019-09-01 MED ORDER — KCL IN DEXTROSE-NACL 20-5-0.9 MEQ/L-%-% IV SOLN
INTRAVENOUS | Status: DC
  Filled 2019-09-01 (×4): qty 1000

## 2019-09-01 MED ORDER — DORZOLAMIDE HCL 2 % OP SOLN
1.0000 [drp] | Freq: Two times a day (BID) | OPHTHALMIC | Status: DC
  Administered 2019-09-01 – 2019-09-04 (×5): 1 [drp] via OPHTHALMIC
  Filled 2019-09-01: qty 10

## 2019-09-01 MED ORDER — SODIUM CHLORIDE (PF) 0.9 % IJ SOLN
INTRAMUSCULAR | Status: AC
  Filled 2019-09-01: qty 50

## 2019-09-01 MED ORDER — PANTOPRAZOLE SODIUM 40 MG IV SOLR
40.0000 mg | Freq: Every day | INTRAVENOUS | Status: DC
  Administered 2019-09-02 – 2019-09-03 (×3): 40 mg via INTRAVENOUS
  Filled 2019-09-01 (×3): qty 40

## 2019-09-01 MED ORDER — FENTANYL CITRATE (PF) 100 MCG/2ML IJ SOLN
50.0000 ug | Freq: Once | INTRAMUSCULAR | Status: AC
  Administered 2019-09-01: 50 ug via INTRAVENOUS
  Filled 2019-09-01: qty 2

## 2019-09-01 MED ORDER — FENTANYL CITRATE (PF) 100 MCG/2ML IJ SOLN
50.0000 ug | Freq: Once | INTRAMUSCULAR | Status: AC
  Administered 2019-09-01: 20:00:00 50 ug via INTRAVENOUS
  Filled 2019-09-01: qty 2

## 2019-09-01 MED ORDER — ONDANSETRON 4 MG PO TBDP: 4.0000 mg | ORAL_TABLET | Freq: Four times a day (QID) | ORAL | Status: DC | PRN

## 2019-09-01 MED ORDER — SODIUM CHLORIDE 0.9 % IV BOLUS
250.0000 mL | Freq: Once | INTRAVENOUS | Status: AC
  Administered 2019-09-01: 17:00:00 250 mL via INTRAVENOUS

## 2019-09-01 MED ORDER — IOHEXOL 300 MG/ML  SOLN
100.0000 mL | Freq: Once | INTRAMUSCULAR | Status: AC | PRN
  Administered 2019-09-01: 100 mL via INTRAVENOUS

## 2019-09-01 MED ORDER — ONDANSETRON HCL 4 MG/2ML IJ SOLN
4.0000 mg | Freq: Once | INTRAMUSCULAR | Status: AC
  Administered 2019-09-01: 4 mg via INTRAVENOUS
  Filled 2019-09-01: qty 2

## 2019-09-01 MED ORDER — MORPHINE SULFATE (PF) 2 MG/ML IV SOLN
1.0000 mg | INTRAVENOUS | Status: DC | PRN
  Administered 2019-09-01: 2 mg via INTRAVENOUS
  Filled 2019-09-01: qty 1

## 2019-09-01 MED ORDER — ONDANSETRON HCL 4 MG/2ML IJ SOLN
4.0000 mg | Freq: Four times a day (QID) | INTRAMUSCULAR | Status: DC | PRN
  Administered 2019-09-01 – 2019-09-03 (×4): 4 mg via INTRAVENOUS
  Filled 2019-09-01 (×4): qty 2

## 2019-09-01 MED ORDER — LEVOTHYROXINE SODIUM 100 MCG/5ML IV SOLN
25.0000 ug | Freq: Every day | INTRAVENOUS | Status: DC
  Filled 2019-09-01 (×2): qty 5

## 2019-09-01 NOTE — ED Notes (Signed)
Date and time results received: 09/01/19 8:14 PM  Test: Lactic Acid  Critical Value: 2.4  Name of Provider Notified: Vanita Panda, MD   Orders Received? Or Actions Taken?:

## 2019-09-01 NOTE — ED Provider Notes (Signed)
[facility] DEPT Provider Note   CSN: [id number] Arrival date & time: 09/01/19  1608     History Chief Complaint  Patient presents with  . Abdominal Pain    Possible Hernia     Megan Molina is a 86 y.o. female presenting for evaluation of abdominal pain.  Patient states she has had gradually worsening abdominal pain today.  She states she is feeling constipated, but still passing gas.  She states over the past 4 days, she has not been having her normal regular bowel movements.  She reports associated nausea, does not have anything in her stomach and this has not been vomiting but instead reports dry heaving.  She has not taken anything for pain including Tylenol or ibuprofen.  She denies previous history of similar abdominal problems.  No fevers, chills, chest pain, shortness of breath, cough, urinary symptoms.  She reports previous abdominal abscess drainage with associated appendectomy, no other abdominal surgeries.  She has a history of hypothyroidism and hypercholesterolemia for which he takes medication, no other medical problems.  She denies history of A. fib and is not on blood thinners. She reports a mass/bulge in her RLQ that was first noticed today.   HPI     Past Medical History:  Diagnosis Date  . Glaucoma   . Hypercholesteremia   . Hypothyroid   . Osteopenia 06/2016   T score -2.4 stable from prior DEXA.    There are no problems to display for this patient.   Past Surgical History:  Procedure Laterality Date  . ABCESS DRAINAGE     INTESTINAL ABCESS  . APPENDECTOMY    . BREAST BIOPSY Right   . BREAST SURGERY     BIOPSY  . cataract surgery    . COLONOSCOPY  2013     OB History    Gravida  3   Para  3   Term      Preterm      AB      Living  3     SAB      TAB      Ectopic      Multiple      Live Births              Family History  Problem Relation Age of Onset  . Pancreatic cancer Mother   . Colon  cancer Neg Hx   . Stomach cancer Neg Hx     Social History   Tobacco Use  . Smoking status: Former Smoker    Quit date: 04/25/1988    Years since quitting: 31.3  . Smokeless tobacco: Never Used  Substance Use Topics  . Alcohol use: Yes    Alcohol/week: 7.0 standard drinks    Types: 7 Standard drinks or equivalent per week  . Drug use: No    Home Medications Prior to Admission medications   Medication Sig Start Date End Date Taking? Authorizing Provider  Ascorbic Acid (VITAMIN C PO) Take by mouth.     Yes [provider]  aspirin 81 MG tablet Take 81 mg by mouth every other day.    Yes [provider]  Calcium Carbonate (CALTRATE 600 PO) Take by mouth.     Yes [provider]  Cholecalciferol (VITAMIN D PO) Take by mouth.     Yes [provider]  Cyanocobalamin (VITAMIN B-12 PO) Take by mouth.     Yes [provider]  dorzolamide (TRUSOPT) 2 % ophthalmic solution  Place 1 drop into both eyes 2 (two) times daily.    Yes [provider]  fish oil-omega-3 fatty acids 1000 MG capsule Take by mouth daily.   Yes [provider]  levothyroxine (SYNTHROID) 25 MCG tablet Take 25 mcg by mouth every other day.  06/07/19  Yes [provider]  levothyroxine (SYNTHROID) 50 MCG tablet Take 50 mcg by mouth every other day.    Yes [provider]  rosuvastatin (CRESTOR) 5 MG tablet Take 5 mg by mouth daily.    Yes [provider]    Allergies    Sulfa antibiotics  Review of Systems   Review of Systems  Gastrointestinal: Positive for abdominal pain, constipation, nausea and vomiting.  All other systems reviewed and are negative.   Physical Exam Updated Vital Signs BP (!) 154/75   Pulse 78   Temp 98 F (36.7 C) (Oral)   Resp 14   SpO2 97%   Physical Exam Vitals and nursing note reviewed.  Constitutional:      General: She is not in acute distress.    Appearance: She is well-developed.      Comments: Elderly female who appears uncomfortable, but otherwise nontoxic  HENT:     Head: Normocephalic and atraumatic.  Eyes:     Extraocular Movements: Extraocular movements intact.     Conjunctiva/sclera: Conjunctivae normal.     Pupils: Pupils are equal, round, and reactive to light.  Cardiovascular:     Rate and Rhythm: Normal rate. Rhythm irregular.     Pulses: Normal pulses.     Comments: Irregular, but not in afib Pulmonary:     Effort: Pulmonary effort is normal. No respiratory distress.     Breath sounds: Normal breath sounds. No wheezing.  Abdominal:     General: There is no distension.     Palpations: Abdomen is soft. There is no mass.     Tenderness: There is generalized abdominal tenderness. There is no guarding or rebound.       Comments: approx 3 cm diameter mass of the R inguinal region. No erytehma, tender, not reducible.  Generalized tenderness palpation the abdomen.  No rigidity, guarding or distention.  Negative rebound.  No peritonitis.  Musculoskeletal:        General: Normal range of motion.     Cervical back: Normal range of motion and neck supple.     Right lower leg: No edema.     Left lower leg: No edema.  Skin:    General: Skin is warm and dry.     Capillary Refill: Capillary refill takes less than 2 seconds.  Neurological:     Mental Status: She is alert and oriented to person, place, and time.     ED Results / Procedures / Treatments   Labs (all labs ordered are listed, but only abnormal results are displayed) Labs Reviewed  CBC WITH DIFFERENTIAL/PLATELET - Abnormal; Notable for the following components:      Result Value   WBC 10.6 (*)    Hemoglobin 15.7 (*)    HCT 48.2 (*)    Neutro Abs 8.7 (*)    All other components within normal limits  COMPREHENSIVE METABOLIC PANEL - Abnormal; Notable for the following components:   Glucose, Bld 160 (*)    Total Bilirubin 1.6 (*)    All other components within normal limits  RESPIRATORY PANEL BY  RT PCR (FLU A&B, COVID)  URINALYSIS, ROUTINE W REFLEX MICROSCOPIC  LACTIC ACID, PLASMA  LACTIC ACID,  PLASMA    EKG None  Radiology CT ABDOMEN PELVIS W CONTRAST  Result Date: 09/01/2019 CLINICAL DATA:  Abdominal distension EXAM: CT ABDOMEN AND PELVIS WITH CONTRAST TECHNIQUE: Multidetector CT imaging of the abdomen and pelvis was performed using the standard protocol following bolus administration of intravenous contrast. CONTRAST:  137mL OMNIPAQUE IOHEXOL 300 MG/ML  SOLN COMPARISON:  None. FINDINGS: Lower chest: Bibasilar atelectasis. The heart is enlarged. Hepatobiliary: No focal liver abnormality is seen. No gallstones, gallbladder wall thickening, or biliary dilatation. Pancreas: Unremarkable. No pancreatic ductal dilatation or surrounding inflammatory changes. Spleen: Normal in size without focal abnormality. Adrenals/Urinary Tract: Adrenal glands are unremarkable. Other than a 9 mm renal cyst on the left, the kidneys are normal, without renal calculi, focal lesion, or hydronephrosis. Bladder is unremarkable. Stomach/Bowel: Stomach is within normal limits. There is a right femoral hernia containing a loop of small bowel which results in a high-grade small bowel obstruction. Fluid within the hernia sac is concerning for bowel ischemia/strangulation. The distal small bowel and the large bowel appear normal. Appendix appears normal. Vascular/Lymphatic: Aortic atherosclerosis. No enlarged abdominal or pelvic lymph nodes. Reproductive: A right adnexal cyst measures 4.7 x 3.2 cm. The uterus appears normal. Other: No abdominopelvic ascites. Musculoskeletal: Degenerative changes are seen in the lumbar spine including grade 1 anterolisthesis of L4 on L5. IMPRESSION: 1. Right femoral hernia containing a loop of small bowel which results in a high-grade small bowel obstruction. Fluid within the hernia sac is concerning for bowel ischemia/strangulation. 2. Right adnexal cyst measuring 4.7 cm. Recommend non  emergent pelvic ultrasound for further evaluation. This recommendation follows ACR consensus guidelines: White Paper of the ACR Incidental Findings Committee II on Adnexal Findings. J Am Coll Radiol [phone]. Aortic Atherosclerosis (ICD10-I70.0). These results were called by telephone at the time of interpretation on 09/01/2019 at 6:47 pm to provider [facility], who verbally acknowledged these results. Electronically Signed   By: Zerita Boers M.D.   On: 09/01/2019 18:52    Procedures Procedures (including critical care time)  Medications Ordered in ED Medications  fentaNYL (SUBLIMAZE) injection 50 mcg (50 mcg Intravenous Given 09/01/19 1716)  ondansetron (ZOFRAN) injection 4 mg (4 mg Intravenous Given 09/01/19 1716)  sodium chloride 0.9 % bolus 250 mL (0 mLs Intravenous Stopped 09/01/19 1759)  iohexol (OMNIPAQUE) 300 MG/ML solution 100 mL (100 mLs Intravenous Contrast Given 09/01/19 1815)  sodium chloride (PF) 0.9 % injection (  Given by Other 09/01/19 1849)    ED Course  I have reviewed the triage vital signs and the nursing notes.  Pertinent labs & imaging results that were available during my care of the patient were reviewed by me and considered in my medical decision making (see chart for details).    MDM Rules/Calculators/A&P                      Patient presenting for evaluation of abdominal pain, nausea, and constipation.  On exam, patient appears uncomfortable, otherwise nontoxic.  She is generalized abdominal tenderness.  She has a approximately 3 cm in diameter mass of the right inguinal area that is soft but tender and not reducible.  Concern for possible incarcerated hernia.  Concern for small bowel obstruction versus constipation versus cancer.  Will obtain labs CT abdomen pelvis for further evaluation.  Labs show mild leukocytosis of 10.6.  Otherwise reassuring.  CT abdomen pelvis shows right femoral hernia with fluid concerning for incarceration is causing a high-grade  small bowel obstruction.  Discussed findings with  patient and son.  On reassessment after pain and nausea control, symptoms are improved.  She is stable at this time.  Will consult general surgery.  Discussed with Dr. Marlou Starks from general surgery, he will evaluate and admit the patient.  Final Clinical Impression(s) / ED Diagnoses Final diagnoses:  Complicated unilateral incarcerated femoral hernia  SBO (small bowel obstruction) ([place])  Adnexal cyst    Rx / DC Orders ED Discharge Orders    None       Franchot Heidelberg, PA-C 09/01/19 1914    Carmin Muskrat, MD 09/01/19 2357

## 2019-09-01 NOTE — H&P (Signed)
Megan Molina is an 86 y.o. female.   Chief Complaint: Right femoral hernia HPI: The patient is an 86 year old white female who began having abdominal pain this afternoon.  At that time she noticed a small lump in her right groin.  She has had several episodes of nausea and vomiting.  She did have a small bowel movement earlier today.  She is otherwise in pretty good health and does not smoke.  She came to the emergency department where a CT scan was done that showed an incarcerated right femoral hernia causing a small bowel obstruction.  Her white count was normal.  She denied fevers or chills.  Past Medical History:  Diagnosis Date  . Glaucoma   . Hypercholesteremia   . Hypothyroid   . Osteopenia 06/2016   T score -2.4 stable from prior DEXA.    Past Surgical History:  Procedure Laterality Date  . ABCESS DRAINAGE     INTESTINAL ABCESS  . APPENDECTOMY    . BREAST BIOPSY Right   . BREAST SURGERY     BIOPSY  . cataract surgery    . COLONOSCOPY  2013    Family History  Problem Relation Age of Onset  . Pancreatic cancer Mother   . Colon cancer Neg Hx   . Stomach cancer Neg Hx    Social History:  reports that she quit smoking about 31 years ago. She has never used smokeless tobacco. She reports current alcohol use of about 7.0 standard drinks of alcohol per week. She reports that she does not use drugs.  Allergies:  Allergies  Allergen Reactions  . Sulfa Antibiotics Rash    (Not in a hospital admission)   Results for orders placed or performed during the hospital encounter of 09/01/19 (from the past 48 hour(s))  CBC with Differential     Status: Abnormal   Collection Time: 09/01/19  4:50 PM  Result Value Ref Range   WBC 10.6 (H) 4.0 - 10.5 K/uL   RBC 4.99 3.87 - 5.11 MIL/uL   Hemoglobin 15.7 (H) 12.0 - 15.0 g/dL   HCT 48.2 (H) 36.0 - 46.0 %   MCV 96.6 80.0 - 100.0 fL   MCH 31.5 26.0 - 34.0 pg   MCHC 32.6 30.0 - 36.0 g/dL   RDW 12.5 11.5 - 15.5 %   Platelets 248 150 -  400 K/uL   nRBC 0.0 0.0 - 0.2 %   Neutrophils Relative % 83 %   Neutro Abs 8.7 (H) 1.7 - 7.7 K/uL   Lymphocytes Relative 14 %   Lymphs Abs 1.5 0.7 - 4.0 K/uL   Monocytes Relative 3 %   Monocytes Absolute 0.4 0.1 - 1.0 K/uL   Eosinophils Relative 0 %   Eosinophils Absolute 0.0 0.0 - 0.5 K/uL   Basophils Relative 0 %   Basophils Absolute 0.0 0.0 - 0.1 K/uL   Immature Granulocytes 0 %   Abs Immature Granulocytes 0.03 0.00 - 0.07 K/uL    Comment: Performed at [facility], [place] [address] [place], [place] [address]  Comprehensive metabolic panel     Status: Abnormal   Collection Time: 09/01/19  4:50 PM  Result Value Ref Range   Sodium 137 135 - 145 mmol/L   Potassium 3.9 3.5 - 5.1 mmol/L   Chloride 101 98 - 111 mmol/L   CO2 23 22 - 32 mmol/L   Glucose, Bld 160 (H) 70 - 99 mg/dL    Comment: Glucose reference range applies only to samples  taken after fasting for at least 8 hours.   BUN 17 8 - 23 mg/dL   Creatinine, Ser 0.69 0.44 - 1.00 mg/dL   Calcium 9.7 8.9 - 10.3 mg/dL   Total Protein 7.3 6.5 - 8.1 g/dL   Albumin 4.3 3.5 - 5.0 g/dL   AST 24 15 - 41 U/L   ALT 21 0 - 44 U/L   Alkaline Phosphatase 50 38 - 126 U/L   Total Bilirubin 1.6 (H) 0.3 - 1.2 mg/dL   GFR calc non Af Amer >60 >60 mL/min   GFR calc Af Amer >60 >60 mL/min   Anion gap 13 5 - 15    Comment: Performed at [facility], [place] [address] [place], [place] [address]  Urinalysis, Routine w reflex microscopic     Status: Abnormal   Collection Time: 09/01/19  4:52 PM  Result Value Ref Range   Color, Urine STRAW (A) YELLOW   APPearance CLEAR CLEAR   Specific Gravity, Urine >1.046 (H) 1.005 - 1.030   pH 7.0 5.0 - 8.0   Glucose, UA NEGATIVE NEGATIVE mg/dL   Hgb urine dipstick NEGATIVE NEGATIVE   Bilirubin Urine NEGATIVE NEGATIVE   Ketones, ur 5 (A) NEGATIVE mg/dL   Protein, ur NEGATIVE NEGATIVE mg/dL   Nitrite NEGATIVE NEGATIVE   Leukocytes,Ua NEGATIVE NEGATIVE    Comment:  Performed at [facility] [address], [place], [place] [address]  Lactic acid, plasma     Status: Abnormal   Collection Time: 09/01/19  7:16 PM  Result Value Ref Range   Lactic Acid, Venous 2.4 (HH) 0.5 - 1.9 mmol/L    Comment: CRITICAL RESULT CALLED TO, READ BACK BY AND VERIFIED WITH: Hal Morales RN 2014 09/01/19 JM Performed at [facility], [place] [address], [place], [place] [address]   Respiratory Panel by RT PCR (Flu A&B, Covid) - Nasopharyngeal Swab     Status: None   Collection Time: 09/01/19  7:16 PM   Specimen: Nasopharyngeal Swab  Result Value Ref Range   SARS Coronavirus 2 by RT PCR NEGATIVE NEGATIVE    Comment: (NOTE) SARS-CoV-2 target nucleic acids are NOT DETECTED. The SARS-CoV-2 RNA is generally detectable in upper respiratoy specimens during the acute phase of infection. The lowest concentration of SARS-CoV-2 viral copies this assay can detect is 131 copies/mL. A negative result does not preclude SARS-Cov-2 infection and should not be used as the sole basis for treatment or other patient management decisions. A negative result may occur with  improper specimen collection/handling, submission of specimen other than nasopharyngeal swab, presence of viral mutation(s) within the areas targeted by this assay, and inadequate number of viral copies (<131 copies/mL). A negative result must be combined with clinical observations, patient history, and epidemiological information. The expected result is Negative. Fact Sheet for Patients:  PinkCheek.be Fact Sheet for Healthcare Providers:  GravelBags.it This test is not yet ap proved or cleared by the [place] FDA and  has been authorized for detection and/or diagnosis of SARS-CoV-2 by FDA under an Emergency Use Authorization (EUA). This EUA will remain  in effect (meaning this test can be used) for the duration of the COVID-19  declaration under Section 564(b)(1) of the Act, 21 U.S.C. section 360bbb-3(b)(1), unless the authorization is terminated or revoked sooner.    Influenza A by PCR NEGATIVE NEGATIVE   Influenza B by PCR NEGATIVE NEGATIVE    Comment: (NOTE) The Xpert Xpress SARS-CoV-2/FLU/RSV assay is intended as an aid in  the diagnosis of influenza from Nasopharyngeal swab specimens and  should not be used as a sole basis for treatment. Nasal washings and  aspirates are unacceptable for Xpert Xpress SARS-CoV-2/FLU/RSV  testing. Fact Sheet for Patients: PinkCheek.be Fact Sheet for Healthcare Providers: GravelBags.it This test is not yet approved or cleared by the [place] FDA and  has been authorized for detection and/or diagnosis of SARS-CoV-2 by  FDA under an Emergency Use Authorization (EUA). This EUA will remain  in effect (meaning this test can be used) for the duration of the  Covid-19 declaration under Section 564(b)(1) of the Act, 21  U.S.C. section 360bbb-3(b)(1), unless the authorization is  terminated or revoked. Performed at [facility], [place] [address], [place], [place] [address]    CT ABDOMEN PELVIS W CONTRAST  Result Date: 09/01/2019 CLINICAL DATA:  Abdominal distension EXAM: CT ABDOMEN AND PELVIS WITH CONTRAST TECHNIQUE: Multidetector CT imaging of the abdomen and pelvis was performed using the standard protocol following bolus administration of intravenous contrast. CONTRAST:  155mL OMNIPAQUE IOHEXOL 300 MG/ML  SOLN COMPARISON:  None. FINDINGS: Lower chest: Bibasilar atelectasis. The heart is enlarged. Hepatobiliary: No focal liver abnormality is seen. No gallstones, gallbladder wall thickening, or biliary dilatation. Pancreas: Unremarkable. No pancreatic ductal dilatation or surrounding inflammatory changes. Spleen: Normal in size without focal abnormality. Adrenals/Urinary Tract: Adrenal glands are  unremarkable. Other than a 9 mm renal cyst on the left, the kidneys are normal, without renal calculi, focal lesion, or hydronephrosis. Bladder is unremarkable. Stomach/Bowel: Stomach is within normal limits. There is a right femoral hernia containing a loop of small bowel which results in a high-grade small bowel obstruction. Fluid within the hernia sac is concerning for bowel ischemia/strangulation. The distal small bowel and the large bowel appear normal. Appendix appears normal. Vascular/Lymphatic: Aortic atherosclerosis. No enlarged abdominal or pelvic lymph nodes. Reproductive: A right adnexal cyst measures 4.7 x 3.2 cm. The uterus appears normal. Other: No abdominopelvic ascites. Musculoskeletal: Degenerative changes are seen in the lumbar spine including grade 1 anterolisthesis of L4 on L5. IMPRESSION: 1. Right femoral hernia containing a loop of small bowel which results in a high-grade small bowel obstruction. Fluid within the hernia sac is concerning for bowel ischemia/strangulation. 2. Right adnexal cyst measuring 4.7 cm. Recommend non emergent pelvic ultrasound for further evaluation. This recommendation follows ACR consensus guidelines: White Paper of the ACR Incidental Findings Committee II on Adnexal Findings. J Am Coll Radiol [phone]. Aortic Atherosclerosis (ICD10-I70.0). These results were called by telephone at the time of interpretation on 09/01/2019 at 6:47 pm to provider [facility], who verbally acknowledged these results. Electronically Signed   By: Zerita Boers M.D.   On: 09/01/2019 18:52    Review of Systems  Constitutional: Negative.   HENT: Negative.   Eyes: Negative.   Respiratory: Negative.   Cardiovascular: Negative.   Gastrointestinal: Positive for abdominal distention, constipation, nausea and vomiting.  Endocrine: Negative.   Genitourinary: Negative.   Musculoskeletal: Negative.   Skin: Negative.   Allergic/Immunologic: Negative.   Neurological: Negative.    Hematological: Negative.   Psychiatric/Behavioral: Negative.     Blood pressure (!) 155/92, pulse 96, temperature 98 F (36.7 C), temperature source Oral, resp. rate 16, SpO2 97 %. Physical Exam  Constitutional: She is oriented to person, place, and time. She appears well-developed and well-nourished. No distress.  HENT:  Head: Normocephalic and atraumatic.  Right Ear: External ear normal.  Left Ear: External ear normal.  Mouth/Throat: Oropharynx is clear and moist.  Eyes: Pupils are equal, round, and reactive to light. Conjunctivae and EOM are normal. No scleral icterus.  Cardiovascular: Normal rate, regular rhythm, normal heart sounds and intact distal pulses.  No pitting edema of the lower extremities  Respiratory: Effort normal and breath sounds normal. No respiratory distress.  No use of accessory respiratory muscles  GI: Soft.  There is mild distension. There is an incarcerated right femoral hernia that I was able to reduce with gentle firm pressure. There is also a small reducible umbilical hernia  Musculoskeletal:        General: No tenderness, deformity or edema. Normal range of motion.     Cervical back: Normal range of motion and neck supple.  Lymphadenopathy:    She has no cervical adenopathy.  No palpable groin or cervical lymphadenopathy  Neurological: She is alert and oriented to person, place, and time.  Skin: Skin is warm and dry. No rash noted. No erythema.  Psychiatric: She has a normal mood and affect. Her behavior is normal. Thought content normal.     Assessment/Plan The patient appears to have an incarcerated right femoral hernia that I was able to reduce with firm but gentle pressure.  I suspect that this will relieve the small bowel obstruction.  At this point there is not an indication for urgent surgery.  We will plan to admit her to the hospital for close observation and allow her bowel obstruction to resolve.  At that point in time she will be a  candidate to have a definitive surgery to fix the hernia so that this does not happen to her again.  I will discuss this with her primary team in the morning.  Autumn Messing III, MD 09/01/2019, 9:10 PM

## 2019-09-01 NOTE — ED Triage Notes (Signed)
Pt arrived ambulatory into ED CC abd pain and new onset mass LLQ. Pt reports " not having her normal BM and has been less in the past couple days expressed concern for bowel obstruction. Pt denies blood in stools. VSS  Afebrile. Son at bedside.   Hx thyroid issues

## 2019-09-01 NOTE — ED Notes (Signed)
Pt ambulatory to RR 

## 2019-09-02 ENCOUNTER — Encounter (HOSPITAL_COMMUNITY): Admission: EM | Disposition: A | Discharge status: Home Health | Payer: Self-pay | Source: Home / Self Care

## 2019-09-02 ENCOUNTER — Inpatient Hospital Stay (HOSPITAL_COMMUNITY): Payer: Medicare PPO | Admitting: Anesthesiology

## 2019-09-02 ENCOUNTER — Encounter (HOSPITAL_COMMUNITY): Payer: Self-pay | Admitting: Surgery

## 2019-09-02 DIAGNOSIS — K4130 Unilateral femoral hernia, with obstruction, without gangrene, not specified as recurrent: Secondary | ICD-10-CM | POA: Diagnosis present

## 2019-09-02 HISTORY — PX: INGUINAL HERNIA REPAIR: SHX194

## 2019-09-02 LAB — CBC
HCT: 45.6 % (ref 36.0–46.0)
Hemoglobin: 15.2 g/dL — ABNORMAL HIGH (ref 12.0–15.0)
MCH: 31.3 pg (ref 26.0–34.0)
MCHC: 33.3 g/dL (ref 30.0–36.0)
MCV: 93.8 fL (ref 80.0–100.0)
Platelets: 270 10*3/uL (ref 150–400)
RBC: 4.86 MIL/uL (ref 3.87–5.11)
RDW: 12.9 % (ref 11.5–15.5)
WBC: 13 10*3/uL — ABNORMAL HIGH (ref 4.0–10.5)
nRBC: 0 % (ref 0.0–0.2)

## 2019-09-02 LAB — SAMPLE TO BLOOD BANK

## 2019-09-02 SURGERY — REPAIR, HERNIA, INGUINAL, LAPAROSCOPIC
Anesthesia: General | Laterality: Bilateral

## 2019-09-02 MED ORDER — GUAIFENESIN-DM 100-10 MG/5ML PO SYRP: 10.0000 mL | ORAL_SOLUTION | ORAL | Status: DC | PRN

## 2019-09-02 MED ORDER — BUPIVACAINE LIPOSOME 1.3 % IJ SUSP
20.0000 mL | Freq: Once | INTRAMUSCULAR | Status: DC
  Filled 2019-09-02: qty 20

## 2019-09-02 MED ORDER — LIP MEDEX EX OINT
1.0000 "application " | TOPICAL_OINTMENT | Freq: Two times a day (BID) | CUTANEOUS | Status: DC
  Administered 2019-09-02 – 2019-09-04 (×4): 1 via TOPICAL
  Filled 2019-09-02 (×2): qty 7

## 2019-09-02 MED ORDER — DEXAMETHASONE SODIUM PHOSPHATE 10 MG/ML IJ SOLN
INTRAMUSCULAR | Status: DC | PRN
  Administered 2019-09-02: 8 mg via INTRAVENOUS

## 2019-09-02 MED ORDER — METHOCARBAMOL 500 MG PO TABS
1000.0000 mg | ORAL_TABLET | Freq: Four times a day (QID) | ORAL | Status: DC | PRN
  Administered 2019-09-02: 1000 mg via ORAL
  Filled 2019-09-02 (×2): qty 2

## 2019-09-02 MED ORDER — PROPOFOL 10 MG/ML IV BOLUS
INTRAVENOUS | Status: AC
  Filled 2019-09-02: qty 20

## 2019-09-02 MED ORDER — BUPIVACAINE HCL (PF) 0.25 % IJ SOLN
INTRAMUSCULAR | Status: DC | PRN
  Administered 2019-09-02: 40 mL

## 2019-09-02 MED ORDER — ENALAPRILAT 1.25 MG/ML IV SOLN
0.6250 mg | Freq: Four times a day (QID) | INTRAVENOUS | Status: DC | PRN
  Filled 2019-09-02: qty 1

## 2019-09-02 MED ORDER — SODIUM CHLORIDE 0.9% FLUSH
3.0000 mL | Freq: Two times a day (BID) | INTRAVENOUS | Status: DC
  Administered 2019-09-02 – 2019-09-04 (×4): 3 mL via INTRAVENOUS

## 2019-09-02 MED ORDER — FENTANYL CITRATE (PF) 100 MCG/2ML IJ SOLN
25.0000 ug | INTRAMUSCULAR | Status: DC | PRN
  Administered 2019-09-02 (×2): 25 ug via INTRAVENOUS
  Filled 2019-09-02 (×2): qty 2

## 2019-09-02 MED ORDER — PROPOFOL 10 MG/ML IV BOLUS
INTRAVENOUS | Status: DC | PRN
  Administered 2019-09-02: 30 mg via INTRAVENOUS
  Administered 2019-09-02: 100 mg via INTRAVENOUS

## 2019-09-02 MED ORDER — ENSURE PRE-SURGERY PO LIQD
296.0000 mL | Freq: Once | ORAL | Status: DC
  Filled 2019-09-02: qty 296

## 2019-09-02 MED ORDER — ASPIRIN EC 81 MG PO TBEC
81.0000 mg | DELAYED_RELEASE_TABLET | ORAL | Status: DC
  Administered 2019-09-02 – 2019-09-04 (×2): 81 mg via ORAL
  Filled 2019-09-02 (×2): qty 1

## 2019-09-02 MED ORDER — SIMETHICONE 40 MG/0.6ML PO SUSP
40.0000 mg | Freq: Four times a day (QID) | ORAL | Status: DC | PRN
  Filled 2019-09-02: qty 0.6

## 2019-09-02 MED ORDER — LIDOCAINE 2% (20 MG/ML) 5 ML SYRINGE
INTRAMUSCULAR | Status: AC
  Filled 2019-09-02: qty 5

## 2019-09-02 MED ORDER — ASPIRIN 81 MG PO TABS: 81.0000 mg | ORAL_TABLET | ORAL | Status: DC

## 2019-09-02 MED ORDER — SODIUM CHLORIDE 0.9% FLUSH: 3.0000 mL | INTRAVENOUS | Status: DC | PRN

## 2019-09-02 MED ORDER — ACETAMINOPHEN 650 MG RE SUPP: 650.0000 mg | Freq: Four times a day (QID) | RECTAL | Status: DC | PRN

## 2019-09-02 MED ORDER — LEVOTHYROXINE SODIUM 50 MCG PO TABS
50.0000 ug | ORAL_TABLET | ORAL | Status: DC
  Administered 2019-09-04: 50 ug via ORAL
  Filled 2019-09-02: qty 1

## 2019-09-02 MED ORDER — 0.9 % SODIUM CHLORIDE (POUR BTL) OPTIME
TOPICAL | Status: DC | PRN
  Administered 2019-09-02: 1000 mL

## 2019-09-02 MED ORDER — METOPROLOL TARTRATE 5 MG/5ML IV SOLN: 5.0000 mg | Freq: Four times a day (QID) | INTRAVENOUS | Status: DC | PRN

## 2019-09-02 MED ORDER — KETOROLAC TROMETHAMINE 15 MG/ML IJ SOLN
15.0000 mg | Freq: Four times a day (QID) | INTRAMUSCULAR | Status: DC | PRN
  Administered 2019-09-02: 17:00:00 15 mg via INTRAVENOUS
  Filled 2019-09-02: qty 1

## 2019-09-02 MED ORDER — ACETAMINOPHEN 500 MG PO TABS
1000.0000 mg | ORAL_TABLET | ORAL | Status: AC
  Administered 2019-09-02: 1000 mg via ORAL
  Filled 2019-09-02: qty 2

## 2019-09-02 MED ORDER — SUGAMMADEX SODIUM 200 MG/2ML IV SOLN
INTRAVENOUS | Status: DC | PRN
Start: 2019-09-02 — End: 2019-09-02
  Administered 2019-09-02: 125 mg via INTRAVENOUS

## 2019-09-02 MED ORDER — LACTATED RINGERS IV SOLN: INTRAVENOUS | Status: DC

## 2019-09-02 MED ORDER — ALBUMIN HUMAN 5 % IV SOLN
12.5000 g | Freq: Four times a day (QID) | INTRAVENOUS | Status: DC | PRN
  Filled 2019-09-02: qty 250

## 2019-09-02 MED ORDER — MIDAZOLAM HCL 2 MG/2ML IJ SOLN
INTRAMUSCULAR | Status: AC
  Filled 2019-09-02: qty 2

## 2019-09-02 MED ORDER — ASCORBIC ACID 500 MG PO TABS
500.0000 mg | ORAL_TABLET | Freq: Two times a day (BID) | ORAL | Status: DC
  Administered 2019-09-02 – 2019-09-04 (×5): 500 mg via ORAL
  Filled 2019-09-02 (×5): qty 1

## 2019-09-02 MED ORDER — PHENYLEPHRINE 40 MCG/ML (10ML) SYRINGE FOR IV PUSH (FOR BLOOD PRESSURE SUPPORT)
PREFILLED_SYRINGE | INTRAVENOUS | Status: AC
  Filled 2019-09-02: qty 10

## 2019-09-02 MED ORDER — ROCURONIUM BROMIDE 100 MG/10ML IV SOLN
INTRAVENOUS | Status: DC | PRN
  Administered 2019-09-02: 20 mg via INTRAVENOUS
  Administered 2019-09-02: 10 mg via INTRAVENOUS
  Administered 2019-09-02: 60 mg via INTRAVENOUS
  Administered 2019-09-02: 20 mg via INTRAVENOUS

## 2019-09-02 MED ORDER — BUPIVACAINE HCL 0.25 % IJ SOLN
INTRAMUSCULAR | Status: AC
  Filled 2019-09-02: qty 1

## 2019-09-02 MED ORDER — ENSURE SURGERY PO LIQD
237.0000 mL | Freq: Two times a day (BID) | ORAL | Status: DC
  Administered 2019-09-03 (×2): 237 mL via ORAL
  Filled 2019-09-02 (×5): qty 237

## 2019-09-02 MED ORDER — ONDANSETRON HCL 4 MG/2ML IJ SOLN
INTRAMUSCULAR | Status: AC
  Filled 2019-09-02: qty 2

## 2019-09-02 MED ORDER — GABAPENTIN 300 MG PO CAPS
300.0000 mg | ORAL_CAPSULE | ORAL | Status: AC
  Administered 2019-09-02: 300 mg via ORAL
  Filled 2019-09-02: qty 1

## 2019-09-02 MED ORDER — DEXAMETHASONE SODIUM PHOSPHATE 10 MG/ML IJ SOLN
INTRAMUSCULAR | Status: AC
  Filled 2019-09-02: qty 1

## 2019-09-02 MED ORDER — CHLORHEXIDINE GLUCONATE CLOTH 2 % EX PADS: 6.0000 | MEDICATED_PAD | Freq: Once | CUTANEOUS | Status: AC

## 2019-09-02 MED ORDER — HYDROCORTISONE 1 % EX CREA
1.0000 "application " | TOPICAL_CREAM | Freq: Three times a day (TID) | CUTANEOUS | Status: DC | PRN
  Filled 2019-09-02: qty 28

## 2019-09-02 MED ORDER — CEFAZOLIN SODIUM-DEXTROSE 2-4 GM/100ML-% IV SOLN
2.0000 g | INTRAVENOUS | Status: AC
  Administered 2019-09-02: 2 g via INTRAVENOUS
  Filled 2019-09-02 (×2): qty 100

## 2019-09-02 MED ORDER — FENTANYL CITRATE (PF) 250 MCG/5ML IJ SOLN
INTRAMUSCULAR | Status: DC | PRN
  Administered 2019-09-02: 50 ug via INTRAVENOUS
  Administered 2019-09-02: 100 ug via INTRAVENOUS
  Administered 2019-09-02 (×4): 50 ug via INTRAVENOUS

## 2019-09-02 MED ORDER — CEFAZOLIN SODIUM-DEXTROSE 2-4 GM/100ML-% IV SOLN
2.0000 g | Freq: Three times a day (TID) | INTRAVENOUS | Status: AC
  Administered 2019-09-02 – 2019-09-03 (×2): 2 g via INTRAVENOUS
  Filled 2019-09-02 (×2): qty 100

## 2019-09-02 MED ORDER — ALBUTEROL SULFATE (2.5 MG/3ML) 0.083% IN NEBU: 2.5000 mg | INHALATION_SOLUTION | Freq: Four times a day (QID) | RESPIRATORY_TRACT | Status: DC | PRN

## 2019-09-02 MED ORDER — PHENOL 1.4 % MT LIQD: 1.0000 | OROMUCOSAL | Status: DC | PRN

## 2019-09-02 MED ORDER — MAGIC MOUTHWASH
15.0000 mL | Freq: Four times a day (QID) | ORAL | Status: DC | PRN
  Filled 2019-09-02: qty 15

## 2019-09-02 MED ORDER — PSYLLIUM 95 % PO PACK
1.0000 | PACK | Freq: Two times a day (BID) | ORAL | Status: DC
  Administered 2019-09-02 – 2019-09-04 (×5): 1 via ORAL
  Filled 2019-09-02 (×5): qty 1

## 2019-09-02 MED ORDER — DIPHENHYDRAMINE HCL 50 MG/ML IJ SOLN: 25.0000 mg | Freq: Four times a day (QID) | INTRAMUSCULAR | Status: DC | PRN

## 2019-09-02 MED ORDER — ONDANSETRON HCL 4 MG/2ML IJ SOLN
INTRAMUSCULAR | Status: DC | PRN
  Administered 2019-09-02: 4 mg via INTRAVENOUS

## 2019-09-02 MED ORDER — ROSUVASTATIN CALCIUM 5 MG PO TABS
5.0000 mg | ORAL_TABLET | Freq: Every day | ORAL | Status: DC
  Administered 2019-09-02 – 2019-09-04 (×3): 5 mg via ORAL
  Filled 2019-09-02 (×3): qty 1

## 2019-09-02 MED ORDER — CHLORHEXIDINE GLUCONATE CLOTH 2 % EX PADS
6.0000 | MEDICATED_PAD | Freq: Once | CUTANEOUS | Status: AC
  Administered 2019-09-02: 09:00:00 6 via TOPICAL

## 2019-09-02 MED ORDER — LIDOCAINE HCL (CARDIAC) PF 100 MG/5ML IV SOSY
PREFILLED_SYRINGE | INTRAVENOUS | Status: DC | PRN
  Administered 2019-09-02: 40 mg via INTRAVENOUS

## 2019-09-02 MED ORDER — ROCURONIUM BROMIDE 10 MG/ML (PF) SYRINGE
PREFILLED_SYRINGE | INTRAVENOUS | Status: AC
  Filled 2019-09-02: qty 10

## 2019-09-02 MED ORDER — METHOCARBAMOL 1000 MG/10ML IJ SOLN
1000.0000 mg | Freq: Four times a day (QID) | INTRAVENOUS | Status: DC | PRN
  Filled 2019-09-02: qty 10

## 2019-09-02 MED ORDER — STERILE WATER FOR IRRIGATION IR SOLN
Status: DC | PRN
  Administered 2019-09-02: 1000 mL

## 2019-09-02 MED ORDER — BISACODYL 10 MG RE SUPP: 10.0000 mg | Freq: Two times a day (BID) | RECTAL | Status: DC | PRN

## 2019-09-02 MED ORDER — ALUM & MAG HYDROXIDE-SIMETH 200-200-20 MG/5ML PO SUSP
30.0000 mL | Freq: Four times a day (QID) | ORAL | Status: DC | PRN
  Administered 2019-09-03: 30 mL via ORAL
  Filled 2019-09-02: qty 30

## 2019-09-02 MED ORDER — ACETAMINOPHEN 325 MG PO TABS
325.0000 mg | ORAL_TABLET | Freq: Four times a day (QID) | ORAL | Status: DC | PRN
  Administered 2019-09-02: 650 mg via ORAL
  Filled 2019-09-02: qty 2

## 2019-09-02 MED ORDER — HYDROCORTISONE (PERIANAL) 2.5 % EX CREA
1.0000 "application " | TOPICAL_CREAM | Freq: Four times a day (QID) | CUTANEOUS | Status: DC | PRN
  Filled 2019-09-02: qty 28.35

## 2019-09-02 MED ORDER — SODIUM CHLORIDE 0.9 % IV SOLN: 250.0000 mL | INTRAVENOUS | Status: DC | PRN

## 2019-09-02 MED ORDER — FENTANYL CITRATE (PF) 100 MCG/2ML IJ SOLN
INTRAMUSCULAR | Status: AC
  Filled 2019-09-02: qty 2

## 2019-09-02 MED ORDER — PHENYLEPHRINE HCL (PRESSORS) 10 MG/ML IV SOLN
INTRAVENOUS | Status: DC | PRN
  Administered 2019-09-02: 120 ug via INTRAVENOUS
  Administered 2019-09-02 (×4): 80 ug via INTRAVENOUS

## 2019-09-02 MED ORDER — LACTATED RINGERS IV BOLUS: 1000.0000 mL | Freq: Three times a day (TID) | INTRAVENOUS | Status: AC | PRN

## 2019-09-02 MED ORDER — BUPIVACAINE LIPOSOME 1.3 % IJ SUSP
INTRAMUSCULAR | Status: DC | PRN
  Administered 2019-09-02: 20 mL

## 2019-09-02 MED ORDER — BUPIVACAINE-EPINEPHRINE 0.5% -1:200000 IJ SOLN
INTRAMUSCULAR | Status: AC
  Filled 2019-09-02: qty 1

## 2019-09-02 MED ORDER — ENOXAPARIN SODIUM 30 MG/0.3ML ~~LOC~~ SOLN
30.0000 mg | SUBCUTANEOUS | Status: DC
  Administered 2019-09-03 – 2019-09-04 (×2): 30 mg via SUBCUTANEOUS
  Filled 2019-09-02 (×2): qty 0.3

## 2019-09-02 MED ORDER — MENTHOL 3 MG MT LOZG: 1.0000 | LOZENGE | OROMUCOSAL | Status: DC | PRN

## 2019-09-02 MED ORDER — PHENYLEPHRINE HCL (PRESSORS) 10 MG/ML IV SOLN
INTRAVENOUS | Status: AC
  Filled 2019-09-02: qty 1

## 2019-09-02 MED ORDER — FENTANYL CITRATE (PF) 250 MCG/5ML IJ SOLN
INTRAMUSCULAR | Status: AC
  Filled 2019-09-02: qty 5

## 2019-09-02 MED ORDER — POLYETHYLENE GLYCOL 3350 17 G PO PACK
17.0000 g | PACK | Freq: Two times a day (BID) | ORAL | Status: DC | PRN
  Administered 2019-09-03: 13:00:00 17 g via ORAL
  Filled 2019-09-02: qty 1

## 2019-09-02 MED ORDER — LACTATED RINGERS IR SOLN
Status: DC | PRN
  Administered 2019-09-02: 1000 mL

## 2019-09-02 MED ORDER — PROCHLORPERAZINE EDISYLATE 10 MG/2ML IJ SOLN
5.0000 mg | INTRAMUSCULAR | Status: DC | PRN
  Administered 2019-09-03: 5 mg via INTRAVENOUS
  Filled 2019-09-02: qty 2

## 2019-09-02 MED ORDER — LEVOTHYROXINE SODIUM 25 MCG PO TABS
25.0000 ug | ORAL_TABLET | ORAL | Status: DC
  Administered 2019-09-03: 05:00:00 25 ug via ORAL
  Filled 2019-09-02: qty 1

## 2019-09-02 SURGICAL SUPPLY — 39 items
APL PRP STRL LF DISP 70% ISPRP (MISCELLANEOUS) ×1
CABLE HIGH FREQUENCY MONO STRZ (ELECTRODE) ×2 IMPLANT
CHLORAPREP W/TINT 26 (MISCELLANEOUS) ×2 IMPLANT
COVER SURGICAL LIGHT HANDLE (MISCELLANEOUS) ×2 IMPLANT
COVER WAND RF STERILE (DRAPES) IMPLANT
DECANTER SPIKE VIAL GLASS SM (MISCELLANEOUS) ×2 IMPLANT
DEVICE SECURE STRAP 25 ABSORB (INSTRUMENTS) IMPLANT
DRAPE WARM FLUID 44X44 (DRAPES) ×2 IMPLANT
DRSG TEGADERM 2-3/8X2-3/4 SM (GAUZE/BANDAGES/DRESSINGS) ×4 IMPLANT
DRSG TEGADERM 4X4.75 (GAUZE/BANDAGES/DRESSINGS) ×2 IMPLANT
ELECT REM PT RETURN 15FT ADLT (MISCELLANEOUS) ×2 IMPLANT
GAUZE SPONGE 2X2 8PLY STRL LF (GAUZE/BANDAGES/DRESSINGS) ×1 IMPLANT
GLOVE ECLIPSE 8.0 STRL XLNG CF (GLOVE) ×2 IMPLANT
GLOVE INDICATOR 8.0 STRL GRN (GLOVE) ×2 IMPLANT
GOWN STRL REUS W/TWL XL LVL3 (GOWN DISPOSABLE) ×4 IMPLANT
IRRIG SUCT STRYKERFLOW 2 WTIP (MISCELLANEOUS)
IRRIGATION SUCT STRKRFLW 2 WTP (MISCELLANEOUS) IMPLANT
KIT BASIN (CUSTOM PROCEDURE TRAY) ×2 IMPLANT
KIT TURNOVER KIT A (KITS) IMPLANT
MESH ULTRAPRO 6X6 15CM15CM (Mesh General) ×3 IMPLANT
NDL INSUFFLATION 14GA 120MM (NEEDLE) IMPLANT
NEEDLE INSUFFLATION 14GA 120MM (NEEDLE) IMPLANT
PAD POSITIONING PINK XL (MISCELLANEOUS) ×2 IMPLANT
SCISSORS LAP 5X35 DISP (ENDOMECHANICALS) ×2 IMPLANT
SET TUBE SMOKE EVAC HIGH FLOW (TUBING) ×2 IMPLANT
SLEEVE ADV FIXATION 5X100MM (TROCAR) ×2 IMPLANT
SPONGE GAUZE 2X2 STER 10/PKG (GAUZE/BANDAGES/DRESSINGS) ×1
SUT MNCRL AB 4-0 PS2 18 (SUTURE) ×3 IMPLANT
SUT PDS AB 1 CT1 27 (SUTURE) ×4 IMPLANT
SUT VIC AB 2-0 SH 27 (SUTURE) ×6
SUT VIC AB 2-0 SH 27X BRD (SUTURE) IMPLANT
SUT VICRYL 0 UR6 27IN ABS (SUTURE) ×2 IMPLANT
TACKER 5MM HERNIA 3.5CML NAB (ENDOMECHANICALS) IMPLANT
TOWEL OR 17X26 10 PK STRL BLUE (TOWEL DISPOSABLE) ×2 IMPLANT
TOWEL OR NON WOVEN STRL DISP B (DISPOSABLE) ×2 IMPLANT
TRAY FOLEY MTR SLVR 14FR STAT (SET/KITS/TRAYS/PACK) ×1 IMPLANT
TRAY LAPAROSCOPIC (CUSTOM PROCEDURE TRAY) ×2 IMPLANT
TROCAR ADV FIXATION 5X100MM (TROCAR) ×2 IMPLANT
TROCAR XCEL BLUNT TIP 100MML (ENDOMECHANICALS) ×2 IMPLANT

## 2019-09-02 NOTE — Anesthesia Preprocedure Evaluation (Addendum)
Anesthesia Evaluation  Patient identified by MRN, date of birth, ID band Patient awake    Reviewed: Allergy & Precautions, NPO status , Patient's Chart, lab work & pertinent test results  History of Anesthesia Complications Negative for: history of anesthetic complications  Airway Mallampati: I  TM Distance: >3 FB Neck ROM: Full    Dental  (+) Caps, Dental Advisory Given   Pulmonary former smoker,  09/01/2019 SARS coronavirus NEG   breath sounds clear to auscultation       Cardiovascular negative cardio ROS   Rhythm:Regular Rate:Normal  '20 ECHO: EF 65-70%, valves OK   Neuro/Psych glaucoma    GI/Hepatic negative GI ROS, Neg liver ROS,   Endo/Other  Hypothyroidism   Renal/GU negative Renal ROS     Musculoskeletal   Abdominal   Peds  Hematology negative hematology ROS (+)   Anesthesia Other Findings   Reproductive/Obstetrics                             Anesthesia Physical Anesthesia Plan  ASA: II  Anesthesia Plan: General   Post-op Pain Management:    Induction: Intravenous  PONV Risk Score and Plan: 3 and Ondansetron, Dexamethasone and Treatment may vary due to age or medical condition  Airway Management Planned: Oral ETT  Additional Equipment: None  Intra-op Plan:   Post-operative Plan: Extubation in OR  Informed Consent: I have reviewed the patients History and Physical, chart, labs and discussed the procedure including the risks, benefits and alternatives for the proposed anesthesia with the patient or authorized representative who has indicated his/her understanding and acceptance.     Dental advisory given  Plan Discussed with: CRNA and Surgeon  Anesthesia Plan Comments:         Anesthesia Quick Evaluation

## 2019-09-02 NOTE — Anesthesia Procedure Notes (Signed)
Procedure Name: Intubation Performed by: Glory Buff, CRNA Pre-anesthesia Checklist: Patient identified, Emergency Drugs available, Suction available and Patient being monitored Patient Re-evaluated:Patient Re-evaluated prior to induction Oxygen Delivery Method: Circle system utilized Preoxygenation: Pre-oxygenation with 100% oxygen Induction Type: IV induction Ventilation: Mask ventilation without difficulty Laryngoscope Size: Miller and 3 Grade View: Grade I Tube type: Oral Tube size: 7.0 mm Number of attempts: 2 Cristy Folks EMT student with 1 attempt at intubation, without success, G Ara Mano CRNA intubated patient with 1 attempt.) Airway Equipment and Method: Stylet and Oral airway Placement Confirmation: ETT inserted through vocal cords under direct vision,  positive ETCO2 and breath sounds checked- equal and bilateral Secured at: 21 cm Tube secured with: Tape Dental Injury: Teeth and Oropharynx as per pre-operative assessment

## 2019-09-02 NOTE — ED Notes (Signed)
Paged Dr. Marlou Starks with surgery with following message:  [facility] ED 7 - [place], [facility] Kid[facility] - gave morphine iv and patient is itching. Can we give benadryl and change pain med? Thanks

## 2019-09-02 NOTE — Discharge Instructions (Signed)
[facility] REPAIR: POST OP INSTRUCTIONS ° °###################################################################### ° °EAT °Gradually transition to a high fiber diet with a fiber supplement over the next few weeks after discharge.  Start with a pureed / full liquid diet (see below) ° °WALK °Walk an hour a day.  Control your pain to do that.   ° °CONTROL PAIN °Control pain so that you can walk, sleep, tolerate sneezing/coughing, and go up/down stairs. ° °HAVE A BOWEL MOVEMENT DAILY °Keep your bowels regular to avoid problems.  OK to try a laxative to override constipation.  OK to use an antidairrheal to slow down diarrhea.  Call if not better after 2 tries ° °CALL IF YOU HAVE PROBLEMS/CONCERNS °Call if you are still struggling despite following these instructions. °Call if you have concerns not answered by these instructions ° °###################################################################### ° ° ° °1. DIET: Follow a light bland diet & liquids the first 24 hours after arrival home, such as soup, liquids, starches, etc.  Be sure to drink plenty of fluids.  Quickly advance to a usual solid diet within a few days.  Avoid fast food or heavy meals as your are more likely to get nauseated or have irregular bowels.  A low-fat, high-fiber diet for the rest of your life is ideal.  ° °2. Take your usually prescribed home medications unless otherwise directed. ° °3. PAIN CONTROL: °a. Pain is best controlled by a usual combination of three different methods TOGETHER: °i. Ice/Heat °ii. Over the counter pain medication °iii. Prescription pain medication °b. Most patients will experience some swelling and bruising around the hernia(s) such as the bellybutton, groins, or old incisions.  Ice packs or heating pads (30-60 minutes up to 6 times a day) will help. Use ice for the first few days to help decrease swelling and bruising, then switch to heat to help relax tight/sore spots and speed recovery.  Some people prefer to use ice  alone, heat alone, alternating between ice & heat.  Experiment to what works for you.  Swelling and bruising can take several weeks to resolve.   °c. It is helpful to take an over-the-counter pain medication regularly for the first few weeks.  Choose one of the following that works best for you: °i. Naproxen (Aleve, etc)  Two 220mg tabs twice a day °ii. Ibuprofen (Advil, etc) Three 200mg tabs four times a day (every meal & bedtime) °iii. Acetaminophen (Tylenol, etc) 325-650mg four times a day (every meal & bedtime) °d. A  prescription for pain medication should be given to you upon discharge.  Take your pain medication as prescribed.  °i. If you are having problems/concerns with the prescription medicine (does not control pain, nausea, vomiting, rash, itching, etc), please call us [phone] to see if we need to switch you to a different pain medicine that will work better for you and/or control your side effect better. °ii. If you need a refill on your pain medication, please contact your pharmacy.  They will contact our office to request authorization. Prescriptions will not be filled after 5 pm or on week-ends. ° °4. Avoid getting constipated.  Between the surgery and the pain medications, it is common to experience some constipation.  Increasing fluid intake and taking a fiber supplement (such as Metamucil, Citrucel, FiberCon, MiraLax, etc) 1-2 times a day regularly will usually help prevent this problem from occurring.  A mild laxative (prune juice, Milk of Magnesia, MiraLax, etc) should be taken according to package directions if there are no bowel movements after 48   hours.   ° °5. Wash / shower every day.  You may shower over the dressings as they are waterproof.   ° °6. Remove your waterproof bandages, skin tapes, and other bandages 5 days after surgery. You may replace a dressing/Band-Aid to cover the incision for comfort if you wish. You may leave the incisions open to air.  You may replace a  dressing/Band-Aid to cover an incision for comfort if you wish.  Continue to shower over incision(s) after the dressing is off. ° °7. ACTIVITIES as tolerated:   °a. You may resume regular (light) daily activities beginning the next day--such as daily self-care, walking, climbing stairs--gradually increasing activities as tolerated.  Control your pain so that you can walk an hour a day.  If you can walk 30 minutes without difficulty, it is safe to try more intense activity such as jogging, treadmill, bicycling, low-impact aerobics, swimming, etc. °b. Save the most intensive and strenuous activity for last such as sit-ups, heavy lifting, contact sports, etc  Refrain from any heavy lifting or straining until you are off narcotics for pain control.   °c. DO NOT PUSH THROUGH PAIN.  Let pain be your guide: If it hurts to do something, don't do it.  Pain is your body warning you to avoid that activity for another week until the pain goes down. °d. You may drive when you are no longer taking prescription pain medication, you can comfortably wear a seatbelt, and you can safely maneuver your car and apply brakes. °e. You may have sexual intercourse when it is comfortable.  ° °8. FOLLOW UP in our office °a. Please call [facility] at [phone] to set up an appointment to see your surgeon in the office for a follow-up appointment approximately 2-3 weeks after your surgery. °b. Make sure that you call for this appointment the day you arrive home to insure a convenient appointment time. ° °9.  If you have disability of FMLA / Family leave forms, please bring the forms to the office for processing.  (do not give to your surgeon). ° °WHEN TO CALL US [phone]: °1. Poor pain control °2. Reactions / problems with new medications (rash/itching, nausea, etc)  °3. Fever over 101.5 F (38.5 C) °4. Inability to urinate °5. Nausea and/or vomiting °6. Worsening swelling or bruising °7. Continued bleeding from incision. °8. Increased pain,  redness, or drainage from the incision ° ° The clinic staff is available to answer your questions during regular business hours (8:30am-5pm).  Please don’t hesitate to call and ask to speak to one of our nurses for clinical concerns.  ° If you have a medical emergency, go to the nearest emergency room or call 911. ° A surgeon from [facility] is always on call at the hospitals in [place] ° °[facility] °[address], [address], [place], [place]  [address] ? ° [address] [address], [place], [place]   [address] °MAIN: [phone] ? TOLL FREE: [phone] ? FAX: [phone] °[email or web address] ° °

## 2019-09-02 NOTE — Plan of Care (Signed)

## 2019-09-02 NOTE — Progress Notes (Signed)
Megan Molina [id number] December 12, 1932  CARE TEAM:  PCP: Audley Hose, MD  Outpatient Care Team: Patient Care Team: Audley Hose, MD as PCP - General (Internal Medicine)  Inpatient Treatment Team: Treatment Team: Attending Provider: Nolon Nations, MD; Attending Physician: Edison Pace Md, MD; Technician: Sharren Bridge, NT; Utilization Review: Sindy Guadeloupe, RN; Registered Nurse: Prince Solian, RN   Problem List:   Active Problems:   Femoral hernia of right side with obstruction   Day of Surgery      Assessment  Status post reduction of incarcerated femoral hernia with reherniaion.  [facility])  Plan:  She is feeling better but is herniated again.  I think it would be wise to try and fix this now to avoid another incarceration event.  Plan lap possible open hernia repair.  Right femoral/inguinal.  Possible left  The anatomy & physiology of the abdominal wall and pelvic floor was discussed.  The pathophysiology of hernias in the inguinal and pelvic region was discussed.  Natural history risks such as progressive enlargement, pain, incarceration, and strangulation was discussed.   Contributors to complications such as smoking, obesity, diabetes, prior surgery, etc were discussed.    I feel the risks of no intervention will lead to serious problems that outweigh the operative risks; therefore, I recommended surgery to reduce and repair the hernia.  I explained laparoscopic techniques with possible need for an open approach.  I noted usual use of mesh to patch and/or buttress hernia repair  Risks such as bleeding, infection, abscess, need for further treatment, injury to other organs, need for repair of tissues / organs, stroke, heart attack, death, and other risks were discussed.  I noted a good likelihood this will help address the problem.   Goals of post-operative recovery were discussed as well.  Possibility that this will not correct all symptoms was  explained.  I stressed the importance of low-impact activity, aggressive pain control, avoiding constipation, & not pushing through pain to minimize risk of post-operative chronic pain or injury. Possibility of reherniation was discussed.  We will work to minimize complications.     An educational handout further explaining the pathology & treatment options was given as well.  Questions were answered.  I did caution her there is a chance I may have to use absorbable mesh or she may require a bowel resection at this time as well.  Hopefully reduction in pain markedly reduced, risk of that is less.  While she is of advanced age she is actually in rather good health and excellent exercise tolerance.  No streaking midline.  She called discussed with her son.  The patient expresses understanding & wishes to proceed with surgery.  -pain control -VTE prophylaxis- SCDs, etc -mobilize as tolerated to help recovery  30 minutes spent in review, evaluation, examination, counseling, and coordination of care.  More than 50% of that time was spent in counseling.  09/02/2019    Subjective: (Chief complaint)  Less pain Denies nausea  Objective:  Vital signs:  Vitals:   09/02/19 0530 09/02/19 0546 09/02/19 0700 09/02/19 0730  BP: 140/72 140/72 (!) 142/79 (!) 146/59  Pulse: 70 77 69 69  Resp:  19 16 17   Temp:      TempSrc:      SpO2: 95% 95% 94% 95%       Intake/Output   Yesterday:  05/09 0701 - 05/10 0700 In: 250 [IV Piggyback:250] Out: -  This shift:  No  intake/output data recorded.  Bowel function:  Flatus: YES  BM:  No  Drain: (No drain)   Physical Exam:  General: Pt awake/alert in no acute distress Eyes: PERRL, normal EOM.  Sclera clear.  No icterus Neuro: CN II-XII intact w/o focal sensory/motor deficits. Lymph: No head/neck/groin lymphadenopathy Psych:  No delerium/psychosis/paranoia.  Oriented x 4 HENT: Normocephalic, Mucus membranes moist.  No thrush Neck: Supple, No  tracheal deviation.  No obvious thyromegaly Chest: No pain to chest wall compression.  Good respiratory excursion.  No audible wheezing CV:  Pulses intact.  Regular rhythm.  No major extremity edema MS: Normal AROM mjr joints.  No obvious deformity Abdomen: Soft.  Nondistended.  Nontender.  No evidence of peritonitis.  No incarcerated hernias.  GU right groin swelling consistent with chronic hernia sac..  At least partially reducable.  No gangrene or erythema.  No severe pain or discomfort.  Ext:   No deformity.  No mjr edema.  No cyanosis Skin: No petechiae / purpurea.  No major sores.  Warm and dry    Results:   Cultures: Recent Results (from the past 720 hour(s))  Respiratory Panel by RT PCR (Flu A&B, Covid) - Nasopharyngeal Swab     Status: None   Collection Time: 09/01/19  7:16 PM   Specimen: Nasopharyngeal Swab  Result Value Ref Range Status   SARS Coronavirus 2 by RT PCR NEGATIVE NEGATIVE Final    Comment: (NOTE) SARS-CoV-2 target nucleic acids are NOT DETECTED. The SARS-CoV-2 RNA is generally detectable in upper respiratoy specimens during the acute phase of infection. The lowest concentration of SARS-CoV-2 viral copies this assay can detect is 131 copies/mL. A negative result does not preclude SARS-Cov-2 infection and should not be used as the sole basis for treatment or other patient management decisions. A negative result may occur with  improper specimen collection/handling, submission of specimen other than nasopharyngeal swab, presence of viral mutation(s) within the areas targeted by this assay, and inadequate number of viral copies (<131 copies/mL). A negative result must be combined with clinical observations, patient history, and epidemiological information. The expected result is Negative. Fact Sheet for Patients:  PinkCheek.be Fact Sheet for Healthcare Providers:  GravelBags.it This test is not yet ap  proved or cleared by the [place] FDA and  has been authorized for detection and/or diagnosis of SARS-CoV-2 by FDA under an Emergency Use Authorization (EUA). This EUA will remain  in effect (meaning this test can be used) for the duration of the COVID-19 declaration under Section 564(b)(1) of the Act, 21 U.S.C. section 360bbb-3(b)(1), unless the authorization is terminated or revoked sooner.    Influenza A by PCR NEGATIVE NEGATIVE Final   Influenza B by PCR NEGATIVE NEGATIVE Final    Comment: (NOTE) The Xpert Xpress SARS-CoV-2/FLU/RSV assay is intended as an aid in  the diagnosis of influenza from Nasopharyngeal swab specimens and  should not be used as a sole basis for treatment. Nasal washings and  aspirates are unacceptable for Xpert Xpress SARS-CoV-2/FLU/RSV  testing. Fact Sheet for Patients: PinkCheek.be Fact Sheet for Healthcare Providers: GravelBags.it This test is not yet approved or cleared by the [place] FDA and  has been authorized for detection and/or diagnosis of SARS-CoV-2 by  FDA under an Emergency Use Authorization (EUA). This EUA will remain  in effect (meaning this test can be used) for the duration of the  Covid-19 declaration under Section 564(b)(1) of the Act, 21  U.S.C. section 360bbb-3(b)(1), unless the authorization is  terminated or  revoked. Performed at [facility], [place] [address] [place], [place] [address]     Labs: Results for orders placed or performed during the hospital encounter of 09/01/19 (from the past 48 hour(s))  CBC with Differential     Status: Abnormal   Collection Time: 09/01/19  4:50 PM  Result Value Ref Range   WBC 10.6 (H) 4.0 - 10.5 K/uL   RBC 4.99 3.87 - 5.11 MIL/uL   Hemoglobin 15.7 (H) 12.0 - 15.0 g/dL   HCT 48.2 (H) 36.0 - 46.0 %   MCV 96.6 80.0 - 100.0 fL   MCH 31.5 26.0 - 34.0 pg   MCHC 32.6 30.0 - 36.0 g/dL   RDW 12.5 11.5 - 15.5 %     Platelets 248 150 - 400 K/uL   nRBC 0.0 0.0 - 0.2 %   Neutrophils Relative % 83 %   Neutro Abs 8.7 (H) 1.7 - 7.7 K/uL   Lymphocytes Relative 14 %   Lymphs Abs 1.5 0.7 - 4.0 K/uL   Monocytes Relative 3 %   Monocytes Absolute 0.4 0.1 - 1.0 K/uL   Eosinophils Relative 0 %   Eosinophils Absolute 0.0 0.0 - 0.5 K/uL   Basophils Relative 0 %   Basophils Absolute 0.0 0.0 - 0.1 K/uL   Immature Granulocytes 0 %   Abs Immature Granulocytes 0.03 0.00 - 0.07 K/uL    Comment: Performed at [facility], [place] [address], [place], [place] [address]  Comprehensive metabolic panel     Status: Abnormal   Collection Time: 09/01/19  4:50 PM  Result Value Ref Range   Sodium 137 135 - 145 mmol/L   Potassium 3.9 3.5 - 5.1 mmol/L   Chloride 101 98 - 111 mmol/L   CO2 23 22 - 32 mmol/L   Glucose, Bld 160 (H) 70 - 99 mg/dL    Comment: Glucose reference range applies only to samples taken after fasting for at least 8 hours.   BUN 17 8 - 23 mg/dL   Creatinine, Ser 0.69 0.44 - 1.00 mg/dL   Calcium 9.7 8.9 - 10.3 mg/dL   Total Protein 7.3 6.5 - 8.1 g/dL   Albumin 4.3 3.5 - 5.0 g/dL   AST 24 15 - 41 U/L   ALT 21 0 - 44 U/L   Alkaline Phosphatase 50 38 - 126 U/L   Total Bilirubin 1.6 (H) 0.3 - 1.2 mg/dL   GFR calc non Af Amer >60 >60 mL/min   GFR calc Af Amer >60 >60 mL/min   Anion gap 13 5 - 15    Comment: Performed at [facility], [place] [address] [place], [place] [address]  Urinalysis, Routine w reflex microscopic     Status: Abnormal   Collection Time: 09/01/19  4:52 PM  Result Value Ref Range   Color, Urine STRAW (A) YELLOW   APPearance CLEAR CLEAR   Specific Gravity, Urine >1.046 (H) 1.005 - 1.030   pH 7.0 5.0 - 8.0   Glucose, UA NEGATIVE NEGATIVE mg/dL   Hgb urine dipstick NEGATIVE NEGATIVE   Bilirubin Urine NEGATIVE NEGATIVE   Ketones, ur 5 (A) NEGATIVE mg/dL   Protein, ur NEGATIVE NEGATIVE mg/dL   Nitrite NEGATIVE NEGATIVE   Leukocytes,Ua NEGATIVE  NEGATIVE    Comment: Performed at [place] [address], [place],  [address]  Lactic acid, plasma     Status: Abnormal   Collection Time: 09/01/19  7:16 PM  Result Value Ref Range  Lactic Acid, Venous 2.4 (HH) 0.5 - 1.9 mmol/L    Comment: CRITICAL RESULT CALLED TO, READ BACK BY AND VERIFIED WITH: Hal Morales RN 2014 09/01/19 JM Performed at [facility], [place] [address] [place], [place] [address]   Respiratory Panel by RT PCR (Flu A&B, Covid) - Nasopharyngeal Swab     Status: None   Collection Time: 09/01/19  7:16 PM   Specimen: Nasopharyngeal Swab  Result Value Ref Range   SARS Coronavirus 2 by RT PCR NEGATIVE NEGATIVE    Comment: (NOTE) SARS-CoV-2 target nucleic acids are NOT DETECTED. The SARS-CoV-2 RNA is generally detectable in upper respiratoy specimens during the acute phase of infection. The lowest concentration of SARS-CoV-2 viral copies this assay can detect is 131 copies/mL. A negative result does not preclude SARS-Cov-2 infection and should not be used as the sole basis for treatment or other patient management decisions. A negative result may occur with  improper specimen collection/handling, submission of specimen other than nasopharyngeal swab, presence of viral mutation(s) within the areas targeted by this assay, and inadequate number of viral copies (<131 copies/mL). A negative result must be combined with clinical observations, patient history, and epidemiological information. The expected result is Negative. Fact Sheet for Patients:  PinkCheek.be Fact Sheet for Healthcare Providers:  GravelBags.it This test is not yet ap proved or cleared by the [place] FDA and  has been authorized for detection and/or diagnosis of SARS-CoV-2 by FDA under an Emergency Use Authorization (EUA). This EUA will remain  in effect (meaning this test can be used) for the  duration of the COVID-19 declaration under Section 564(b)(1) of the Act, 21 U.S.C. section 360bbb-3(b)(1), unless the authorization is terminated or revoked sooner.    Influenza A by PCR NEGATIVE NEGATIVE   Influenza B by PCR NEGATIVE NEGATIVE    Comment: (NOTE) The Xpert Xpress SARS-CoV-2/FLU/RSV assay is intended as an aid in  the diagnosis of influenza from Nasopharyngeal swab specimens and  should not be used as a sole basis for treatment. Nasal washings and  aspirates are unacceptable for Xpert Xpress SARS-CoV-2/FLU/RSV  testing. Fact Sheet for Patients: PinkCheek.be Fact Sheet for Healthcare Providers: GravelBags.it This test is not yet approved or cleared by the [place] FDA and  has been authorized for detection and/or diagnosis of SARS-CoV-2 by  FDA under an Emergency Use Authorization (EUA). This EUA will remain  in effect (meaning this test can be used) for the duration of the  Covid-19 declaration under Section 564(b)(1) of the Act, 21  U.S.C. section 360bbb-3(b)(1), unless the authorization is  terminated or revoked. Performed at [facility], [place] [address], [place], [place] [address]   Lactic acid, plasma     Status: None   Collection Time: 09/01/19  8:27 PM  Result Value Ref Range   Lactic Acid, Venous 1.2 0.5 - 1.9 mmol/L    Comment: Performed at [facility], [place] [address] [place], [place] [address]  CBC     Status: Abnormal   Collection Time: 09/02/19  8:03 AM  Result Value Ref Range   WBC 13.0 (H) 4.0 - 10.5 K/uL   RBC 4.86 3.87 - 5.11 MIL/uL   Hemoglobin 15.2 (H) 12.0 - 15.0 g/dL   HCT 45.6 36.0 - 46.0 %   MCV 93.8 80.0 - 100.0 fL   MCH 31.3 26.0 - 34.0 pg   MCHC 33.3 30.0 - 36.0 g/dL   RDW 12.9 11.5 - 15.5 %   Platelets  270 150 - 400 K/uL   nRBC 0.0 0.0 - 0.2 %    Comment: Performed at [facility], [place] [address],  [place], [place] [address]    Imaging / Studies: CT ABDOMEN PELVIS W CONTRAST  Result Date: 09/01/2019 CLINICAL DATA:  Abdominal distension EXAM: CT ABDOMEN AND PELVIS WITH CONTRAST TECHNIQUE: Multidetector CT imaging of the abdomen and pelvis was performed using the standard protocol following bolus administration of intravenous contrast. CONTRAST:  174mL OMNIPAQUE IOHEXOL 300 MG/ML  SOLN COMPARISON:  None. FINDINGS: Lower chest: Bibasilar atelectasis. The heart is enlarged. Hepatobiliary: No focal liver abnormality is seen. No gallstones, gallbladder wall thickening, or biliary dilatation. Pancreas: Unremarkable. No pancreatic ductal dilatation or surrounding inflammatory changes. Spleen: Normal in size without focal abnormality. Adrenals/Urinary Tract: Adrenal glands are unremarkable. Other than a 9 mm renal cyst on the left, the kidneys are normal, without renal calculi, focal lesion, or hydronephrosis. Bladder is unremarkable. Stomach/Bowel: Stomach is within normal limits. There is a right femoral hernia containing a loop of small bowel which results in a high-grade small bowel obstruction. Fluid within the hernia sac is concerning for bowel ischemia/strangulation. The distal small bowel and the large bowel appear normal. Appendix appears normal. Vascular/Lymphatic: Aortic atherosclerosis. No enlarged abdominal or pelvic lymph nodes. Reproductive: A right adnexal cyst measures 4.7 x 3.2 cm. The uterus appears normal. Other: No abdominopelvic ascites. Musculoskeletal: Degenerative changes are seen in the lumbar spine including grade 1 anterolisthesis of L4 on L5. IMPRESSION: 1. Right femoral hernia containing a loop of small bowel which results in a high-grade small bowel obstruction. Fluid within the hernia sac is concerning for bowel ischemia/strangulation. 2. Right adnexal cyst measuring 4.7 cm. Recommend non emergent pelvic ultrasound for further evaluation. This recommendation follows ACR consensus  guidelines: White Paper of the ACR Incidental Findings Committee II on Adnexal Findings. J Am Coll Radiol [phone]. Aortic Atherosclerosis (ICD10-I70.0). These results were called by telephone at the time of interpretation on 09/01/2019 at 6:47 pm to provider [facility], who verbally acknowledged these results. Electronically Signed   By: Zerita Boers M.D.   On: 09/01/2019 18:52    Medications / Allergies: per chart  Antibiotics: Anti-infectives (From admission, onward)   Start     Dose/Rate Route Frequency Ordered Stop   09/02/19 0800  ceFAZolin (ANCEF) IVPB 2g/100 mL premix     2 g 200 mL/hr over 30 Minutes Intravenous On call to O.R. 09/02/19 [id number] 09/03/19 0559        Note: Portions of this report may have been transcribed using voice recognition software. Every effort was made to ensure accuracy; however, inadvertent computerized transcription errors may be present.   Any transcriptional errors that result from this process are unintentional.    Adin Hector, MD, FACS, MASCRS Gastrointestinal and Minimally Invasive Surgery  [facility] [address], [place], [place] [address] [phone] Fax [phone] Main/Paging  CONTACT INFORMATION: Weekday (9AM-5PM) concerns: Call [facility] main office at [phone] Weeknight (5PM-9AM) or Weekend/Holiday concerns: Check [email or web address] for General Surgery [facility] coverage (Please, do not use SecureChat as it is not reliable communication to surgeons for patient care)      09/02/2019  8:47 AM

## 2019-09-02 NOTE — Transfer of Care (Signed)
Immediate Anesthesia Transfer of Care Note  Patient: Megan Molina  Procedure(s) Performed: LAPAROSCOPIC BILATERAL HERNIA REPAIR, BILATERAL FEMORAL AND LEFT INGUINAL (Bilateral )  Patient Location: PACU  Anesthesia Type:General  Level of Consciousness: drowsy, patient cooperative and responds to stimulation  Airway & Oxygen Therapy: Patient Spontanous Breathing and Patient connected to face mask oxygen  Post-op Assessment: Report given to RN and Post -op Vital signs reviewed and stable  Post vital signs: Reviewed and stable  Last Vitals:  Vitals Value Taken Time  BP 158/86 09/02/19 1226  Temp    Pulse 89 09/02/19 1228  Resp 10 09/02/19 1228  SpO2 100 % 09/02/19 1228  Vitals shown include unvalidated device data.  Last Pain:  Vitals:   09/02/19 0913  TempSrc:   PainSc: 6       Patients Stated Pain Goal: 0 ([id number])  Complications: No apparent anesthesia complications

## 2019-09-02 NOTE — Op Note (Addendum)
09/02/2019  12:18 PM  PATIENT:  Megan Molina  86 y.o. female  Patient Care Team: Audley Hose, MD as PCP - General (Internal Medicine)  PRE-OPERATIVE DIAGNOSIS:  RIGHT FEMORAL HERNIA - INCARCERATED  POST-OPERATIVE DIAGNOSIS:   RIGHT INCARCERATED FEMORAL HERNIA LEFT FEMORAL HERNIA LEFT INGUINAL HERNIA  PROCEDURE:  LAPAROSCOPIC BILATERAL FEMORAL HERNIA REPAIR LEFT INGUINAL HERNIA TAP BLOCK - BILATERAL  SURGEON:  Adin Hector, MD  ASSISTANT: Fran Lowes, PA-S, [employer]  ANESTHESIA:     Regional ilioinguinal and genitofemoral and spermatic cord nerve blocks  General  Nerve block provided with liposomal bupivacaine (Experel) mixed with 0.25% bupivacaine as a Bilateral TAP block x 51mL each side at the level of the transverse abdominis & preperitoneal spaces along the flank at the anterior axillary line, from subcostal ridge to iliac crest under laparoscopic guidance    EBL:  Total I/O In: 1100 [I.V.:1000; IV Piggyback:100] Out: 540 [Urine:100; Other:400; Blood:40].  See anesthesia record  Delay start of Pharmacological VTE agent (>24hrs) due to surgical blood loss or risk of bleeding:  no  DRAINS: NONE  SPECIMEN:  NONE  DISPOSITION OF SPECIMEN:  N/A  COUNTS:  YES  PLAN OF CARE: Discharge to home after PACU  PATIENT DISPOSITION:  PACU - hemodynamically stable.  INDICATION: Active woman with worsening right groin pain.  Came to the [facility] emergency room.  Exam and CT scan suspicious for hernia with small intestine causing partial bowel obstruction.  Seen by my partner last night and able to be reduced.  I recommended laparoscopic possible open exploration repair of hernias found.  The anatomy & physiology of the abdominal wall and pelvic floor was discussed.  The pathophysiology of hernias in the inguinal and pelvic region was discussed.  Natural history risks such as progressive enlargement, pain, incarceration & strangulation was discussed.   Contributors to  complications such as smoking, obesity, diabetes, prior surgery, etc were discussed.    I feel the risks of no intervention will lead to serious problems that outweigh the operative risks; therefore, I recommended surgery to reduce and repair the hernia.  I explained laparoscopic techniques with possible need for an open approach.  I noted usual use of mesh to patch and/or buttress hernia repair  Risks such as bleeding, infection, abscess, need for further treatment, heart attack, death, and other risks were discussed.  I noted a good likelihood this will help address the problem.   Goals of post-operative recovery were discussed as well.  Possibility that this will not correct all symptoms was explained.  I stressed the importance of low-impact activity, aggressive pain control, avoiding constipation, & not pushing through pain to minimize risk of post-operative chronic pain or injury. Possibility of reherniation was discussed.  We will work to minimize complications.     An educational handout further explaining the pathology & treatment options was given as well.  Questions were answered.  The patient expresses understanding & wishes to proceed with surgery.  OR FINDINGS: Moderately large femoral hernia now reduced.  Incarcerated hernia sac.  No necrosis or ischemia.  No gangrene or perforation.  Left-sided small indirect and femoral hernias.  Because no evidence of any shock, purulence, ischemia, perforation, gangrene; I proceeded to do repair with  polypropylene mesh  DESCRIPTION:  The patient was identified & brought into the operating room. The patient was positioned supine with arms tucked. SCDs were active during the entire case. The patient underwent general anesthesia without any difficulty.  The abdomen was  prepped and draped in a sterile fashion. The patient's bladder was emptied.  A Surgical Timeout confirmed our plan.  I made a transverse incision through the inferior umbilical fold.   I made a small transverse nick through the anterior rectus fascia contralateral to the inguinal hernia side and placed a 0-vicryl stitch through the fascia.  I placed a Hasson trocar into the preperitoneal plane.  Entry was clean.  We induced carbon dioxide insufflation. Camera inspection revealed no injury.  I used a 64mm angled scope to bluntly free the peritoneum off the infraumbilical anterior abdominal wall.  I created enough of a preperitoneal pocket to place 81mm ports into the right & left mid-abdomen into this preperitoneal cavity.  I focused attention on the RIGHT pelvis since that was the dominant hernia side.   I used blunt & focused sharp dissection to free the peritoneum off the flank and down to the pubic rim.  I freed the anteriolateral bladder wall off the anteriolateral pelvic wall, sparing midline attachments.   I located a swath of peritoneum going into a hernia fascial defect at the  femoral foramen consistent with  a femoral hernia..  I gradually freed the peritoneal hernia sac off safely and reduced it into the preperitoneal space.  I freed the peritoneum off the round ligament.  I freed peritoneum off the retroperitoneum along the psoas muscle.  Lipomas were dissected away & removed.  I checked & assured hemostasis.  I did open up the hernia sac more.  I ran the small bowel proximally and distally.  I freed a few adhesions of terminal ileum to the peritoneum.  Confirmed ileocecal valve.  Solid area of transition with some mild edema but no evidence of any ischemia or perforation.  This was reassuring that there were no other surprises.  No other concerning findings in the peritoneal cavity.  As anticipated, surgical dissection was challenged by dense adhesions and poor planes resulting in the need for careful repair of the resulting peritoneal hernia sac defects.  Repair was done with 2-0 vicryl minimally invasive intracorporeal suturing.  I turned attention on the opposite  LEFT pelvis.   I did dissection in a similar, mirror-image fashion. The patient had a femoral hernia..   Small but definite indirect inguinal hernia as well.  Spermatic cord lipoma was dissected away & removed.    I checked & assured hemostasis.  Peritoneal repair and hernia sac ligation done with 2-0 Vicryl minimal invasive intracorporeal suturing  Because there was no evidence of any ischemia, gangrene, perforation, purulence, infection, shock; I decided was safe to proceed with repair with mesh.  I chose 15x15 cm sheets of ultra-lightweight polypropylene mesh (Ultrapro), one for each side.  I cut a single sigmoid-shaped slit ~6cm from a corner of each mesh.  I placed the meshes into the preperitoneal space & laid them as overlapping diamonds such that at the inferior points, a 6x6 cm corner flap rested in the true anterolateral pelvis, covering the obturator & femoral foramina.   I allowed the bladder to return to the pubis, this helping tuck the corners of the mesh in the anteriolateral pelvis.  The medial corners overlapped each other across midline cephalad to the pubic rim.   Given the numerous hernias of moderate size, I placed a third 15x15cm mesh in the center as a vertical diamond.  The lateral wings of the mesh overlap across the direct spaces and internal rings where the dominant hernias were.  This provided good coverage  and reinforcement of the hernia repairs.  Because of the central mesh placement with good overlap, I did not place any tacks.   I held the hernia sacs cephalad & evacuated carbon dioxide.  I closed the fascia with absorbable suture.  I closed the skin using 4-0 monocryl stitch.  Sterile dressings were applied.   The patient was extubated & arrived in the PACU in stable condition..  I had discussed postoperative care with the patient in the holding area.  Instructions are written in the chart. I made an attempt to locate family to discuss patient's status and recommendations.  No one is  available at this time.  Left a VM message on son's phone.  We will try again later   Adin Hector, M.D., F.A.C.S. Gastrointestinal and Minimally Invasive Surgery [facility] [address], [place], [place] [address] [phone] Main / Paging  09/02/2019 12:18 PM

## 2019-09-02 NOTE — Anesthesia Postprocedure Evaluation (Signed)
Anesthesia Post Note  Patient: Megan Molina  Procedure(s) Performed: LAPAROSCOPIC BILATERAL HERNIA REPAIR, BILATERAL FEMORAL AND LEFT INGUINAL (Bilateral )     Patient location during evaluation: PACU Anesthesia Type: General Level of consciousness: awake and alert, oriented and patient cooperative Pain management: pain level controlled Vital Signs Assessment: post-procedure vital signs reviewed and stable Respiratory status: spontaneous breathing, nonlabored ventilation and respiratory function stable Cardiovascular status: blood pressure returned to baseline and stable Postop Assessment: no apparent nausea or vomiting Anesthetic complications: no    Last Vitals:  Vitals:   09/02/19 1429 09/02/19 1513  BP: (!) 155/63 136/68  Pulse: 88 73  Resp: 16 18  Temp:  36.9 C  SpO2: 99% 100%    Last Pain:  Vitals:   09/02/19 1513  TempSrc: Oral  PainSc:                  Balinda Heacock,E. Johnchristopher Sarvis

## 2019-09-03 ENCOUNTER — Encounter: Payer: Self-pay | Admitting: *Deleted

## 2019-09-03 LAB — BASIC METABOLIC PANEL
Anion gap: 8 (ref 5–15)
BUN: 16 mg/dL (ref 8–23)
CO2: 24 mmol/L (ref 22–32)
Calcium: 8.3 mg/dL — ABNORMAL LOW (ref 8.9–10.3)
Chloride: 103 mmol/L (ref 98–111)
Creatinine, Ser: 0.81 mg/dL (ref 0.44–1.00)
GFR calc Af Amer: >60 mL/min (ref >60)
GFR calc non Af Amer: >60 mL/min (ref >60)
Glucose, Bld: 128 mg/dL — ABNORMAL HIGH (ref 70–99)
Potassium: 4.3 mmol/L (ref 3.5–5.1)
Sodium: 135 mmol/L (ref 135–145)

## 2019-09-03 LAB — CBC
HCT: 38.1 % (ref 36.0–46.0)
Hemoglobin: 12.4 g/dL (ref 12.0–15.0)
MCH: 31.2 pg (ref 26.0–34.0)
MCHC: 32.5 g/dL (ref 30.0–36.0)
MCV: 96 fL (ref 80.0–100.0)
Platelets: 190 10*3/uL (ref 150–400)
RBC: 3.97 MIL/uL (ref 3.87–5.11)
RDW: 12.8 % (ref 11.5–15.5)
WBC: 12.5 10*3/uL — ABNORMAL HIGH (ref 4.0–10.5)
nRBC: 0 % (ref 0.0–0.2)

## 2019-09-03 LAB — MAGNESIUM: Magnesium: 1.7 mg/dL (ref 1.7–2.4)

## 2019-09-03 MED ORDER — ACETAMINOPHEN 500 MG PO TABS: 1000.0000 mg | ORAL_TABLET | Freq: Three times a day (TID) | ORAL | 0 refills | Status: DC | PRN

## 2019-09-03 MED ORDER — IBUPROFEN 400 MG PO TABS: 400.0000 mg | ORAL_TABLET | Freq: Four times a day (QID) | ORAL | 0 refills | Status: DC | PRN

## 2019-09-03 MED ORDER — POLYETHYLENE GLYCOL 3350 17 G PO PACK: 17.0000 g | PACK | Freq: Two times a day (BID) | ORAL | 0 refills | Status: DC | PRN

## 2019-09-03 MED ORDER — IBUPROFEN 400 MG PO TABS: 400.0000 mg | ORAL_TABLET | Freq: Four times a day (QID) | ORAL | Status: DC | PRN

## 2019-09-03 MED ORDER — SIMETHICONE 80 MG PO CHEW: 80.0000 mg | CHEWABLE_TABLET | Freq: Four times a day (QID) | ORAL | Status: DC | PRN

## 2019-09-03 MED ORDER — ONDANSETRON 4 MG PO TBDP: 4.0000 mg | ORAL_TABLET | Freq: Four times a day (QID) | ORAL | 0 refills | Status: DC | PRN

## 2019-09-03 MED ORDER — ACETAMINOPHEN 500 MG PO TABS
1000.0000 mg | ORAL_TABLET | Freq: Three times a day (TID) | ORAL | Status: DC
  Administered 2019-09-03 – 2019-09-04 (×3): 1000 mg via ORAL
  Filled 2019-09-03 (×3): qty 2

## 2019-09-03 MED ORDER — IBUPROFEN 400 MG PO TABS
800.0000 mg | ORAL_TABLET | Freq: Once | ORAL | Status: AC
  Administered 2019-09-03: 11:00:00 800 mg via ORAL
  Filled 2019-09-03: qty 2

## 2019-09-03 NOTE — Discharge Summary (Signed)
Physician Discharge Summary    Patient ID: Megan Molina MRN: [id number] DOB/AGE: [birth year]  86 y.o.  Patient Care Team: Audley Hose, MD as PCP - General (Internal Medicine)  Admit date: 09/01/2019  Discharge date: 09/03/2019 Hospital Stay = 2 days    Discharge Diagnoses:  Active Problems:   Femoral hernia of right side with obstruction   Incarcerated femoral hernia   1 Day Post-Op  09/02/2019  POST-OPERATIVE DIAGNOSIS:   RIGHT INCARCERATED FEMORAL HERNIA & LEFT FEMORAL HERNIA AND INGUINAL HERNIA  SURGERY:  09/02/2019  Procedure(s): LAPAROSCOPIC BILATERAL HERNIA REPAIR, BILATERAL FEMORAL AND LEFT INGUINAL  SURGEON:    Surgeon(s): Michael Boston, MD  Consults: None  Hospital Course:   The patient underwent  the surgery above on 09/02/2019.  Postoperatively, the patient gradually mobilized and advanced to a solid diet.  Pain and other symptoms were treated aggressively.   Patient overall feeling well, sitting up in chair and ambulating halls this morning. She denies N/V and is using po tylenol for pain control. She received IV Toradol and tylenol for possible migraine, at time of discharge her symptoms were improved.   By the time of discharge, the patient was walking well the hallways, eating food, having flatus.  Pain was well-controlled on an oral medications.  Based on meeting discharge criteria and continuing to recover, I felt it was safe for the patient to be discharged from the hospital to further recover with close followup. Patient with good support system at home. Family in room at time of discharge discussion. Postoperative recommendations were discussed in detail.  Questions were answered. They are written as well.  Discharged Condition: good  Discharge Exam: Blood pressure (!) 104/55, pulse 91, temperature 99.2 F (37.3 C), temperature source Oral, resp. rate 16, height 5\' 5"  (1.651 m), weight 58.1 kg, SpO2 94 %.  General: Pt awake/alert/oriented x4 in  No acute distress Eyes: PERRL, normal EOM.  Sclera clear.  No icterus Neuro: CN II-XII intact w/o focal sensory/motor deficits. Lymph: No head/neck/groin lymphadenopathy Psych:  No delerium/psychosis/paranoia HENT: Normocephalic, Mucus membranes moist.  No thrush Neck: Supple, No tracheal deviation Chest: RRR. No MRG. No chest wall pain w good excursion CV:  Pulses intact.  Regular rhythm MS: Normal AROM mjr joints.  No obvious deformity Abdomen: Soft.  Mildly distended.  Nontender.  No evidence of peritonitis.  No incarcerated hernias. Incisions clean/dry/intact. Dressings in place.  Ext:  SCDs BLE.  No mjr edema.  No cyanosis Skin: No petechiae / purpura   Disposition:   Follow-up Information    Michael Boston, MD. Call.   Specialty: General Surgery Why: We are working on your appointment, call to confirm Please arrive 30 minutes prior to your appointment to check in and fill out paperwork. Bring photo ID and insurance information. Contact information: [address] [place] [address] [phone]        Audley Hose, MD. Call.   Specialty: Internal Medicine Why: Call to arrange post-hospitalization follow up appointment with your primary care physician Contact information: [place] [phone]               Significant Diagnostic Studies:  Results for orders placed or performed during the hospital encounter of 09/01/19 (from the past 72 hour(s))  CBC with Differential     Status: Abnormal   Collection Time: 09/01/19  4:50 PM  Result Value Ref Range   WBC 10.6 (H) 4.0 - 10.5 K/uL  RBC 4.99 3.87 - 5.11 MIL/uL   Hemoglobin 15.7 (H) 12.0 - 15.0 g/dL   HCT 48.2 (H) 36.0 - 46.0 %   MCV 96.6 80.0 - 100.0 fL   MCH 31.5 26.0 - 34.0 pg   MCHC 32.6 30.0 - 36.0 g/dL   RDW 12.5 11.5 - 15.5 %   Platelets 248 150 - 400 K/uL   nRBC 0.0 0.0 - 0.2 %   Neutrophils Relative % 83 %   Neutro Abs 8.7 (H) 1.7  - 7.7 K/uL   Lymphocytes Relative 14 %   Lymphs Abs 1.5 0.7 - 4.0 K/uL   Monocytes Relative 3 %   Monocytes Absolute 0.4 0.1 - 1.0 K/uL   Eosinophils Relative 0 %   Eosinophils Absolute 0.0 0.0 - 0.5 K/uL   Basophils Relative 0 %   Basophils Absolute 0.0 0.0 - 0.1 K/uL   Immature Granulocytes 0 %   Abs Immature Granulocytes 0.03 0.00 - 0.07 K/uL    Comment: Performed at [facility], [place] [address] [place], [place] [address]  Comprehensive metabolic panel     Status: Abnormal   Collection Time: 09/01/19  4:50 PM  Result Value Ref Range   Sodium 137 135 - 145 mmol/L   Potassium 3.9 3.5 - 5.1 mmol/L   Chloride 101 98 - 111 mmol/L   CO2 23 22 - 32 mmol/L   Glucose, Bld 160 (H) 70 - 99 mg/dL    Comment: Glucose reference range applies only to samples taken after fasting for at least 8 hours.   BUN 17 8 - 23 mg/dL   Creatinine, Ser 0.69 0.44 - 1.00 mg/dL   Calcium 9.7 8.9 - 10.3 mg/dL   Total Protein 7.3 6.5 - 8.1 g/dL   Albumin 4.3 3.5 - 5.0 g/dL   AST 24 15 - 41 U/L   ALT 21 0 - 44 U/L   Alkaline Phosphatase 50 38 - 126 U/L   Total Bilirubin 1.6 (H) 0.3 - 1.2 mg/dL   GFR calc non Af Amer >60 >60 mL/min   GFR calc Af Amer >60 >60 mL/min   Anion gap 13 5 - 15    Comment: Performed at [facility], [place] [address], [place], [place] [address]  Urinalysis, Routine w reflex microscopic     Status: Abnormal   Collection Time: 09/01/19  4:52 PM  Result Value Ref Range   Color, Urine STRAW (A) YELLOW   APPearance CLEAR CLEAR   Specific Gravity, Urine >1.046 (H) 1.005 - 1.030   pH 7.0 5.0 - 8.0   Glucose, UA NEGATIVE NEGATIVE mg/dL   Hgb urine dipstick NEGATIVE NEGATIVE   Bilirubin Urine NEGATIVE NEGATIVE   Ketones, ur 5 (A) NEGATIVE mg/dL   Protein, ur NEGATIVE NEGATIVE mg/dL   Nitrite NEGATIVE NEGATIVE   Leukocytes,Ua NEGATIVE NEGATIVE    Comment: Performed at [place] [address], [place], [place] [address]    Lactic acid, plasma     Status: Abnormal   Collection Time: 09/01/19  7:16 PM  Result Value Ref Range   Lactic Acid, Venous 2.4 (HH) 0.5 - 1.9 mmol/L    Comment: CRITICAL RESULT CALLED TO, READ BACK BY AND VERIFIED WITH: Hal Morales RN 2014 09/01/19 JM Performed at [facility], [place] [address], [place], [place] [address]   Respiratory Panel by RT PCR (Flu A&B, Covid) - Nasopharyngeal Swab     Status: None   Collection Time: 09/01/19  7:16 PM  Specimen: Nasopharyngeal Swab  Result Value Ref Range   SARS Coronavirus 2 by RT PCR NEGATIVE NEGATIVE    Comment: (NOTE) SARS-CoV-2 target nucleic acids are NOT DETECTED. The SARS-CoV-2 RNA is generally detectable in upper respiratoy specimens during the acute phase of infection. The lowest concentration of SARS-CoV-2 viral copies this assay can detect is 131 copies/mL. A negative result does not preclude SARS-Cov-2 infection and should not be used as the sole basis for treatment or other patient management decisions. A negative result may occur with  improper specimen collection/handling, submission of specimen other than nasopharyngeal swab, presence of viral mutation(s) within the areas targeted by this assay, and inadequate number of viral copies (<131 copies/mL). A negative result must be combined with clinical observations, patient history, and epidemiological information. The expected result is Negative. Fact Sheet for Patients:  PinkCheek.be Fact Sheet for Healthcare Providers:  GravelBags.it This test is not yet ap proved or cleared by the [place] FDA and  has been authorized for detection and/or diagnosis of SARS-CoV-2 by FDA under an Emergency Use Authorization (EUA). This EUA will remain  in effect (meaning this test can be used) for the duration of the COVID-19 declaration under Section 564(b)(1) of the Act, 21 U.S.C. section 360bbb-3(b)(1), unless  the authorization is terminated or revoked sooner.    Influenza A by PCR NEGATIVE NEGATIVE   Influenza B by PCR NEGATIVE NEGATIVE    Comment: (NOTE) The Xpert Xpress SARS-CoV-2/FLU/RSV assay is intended as an aid in  the diagnosis of influenza from Nasopharyngeal swab specimens and  should not be used as a sole basis for treatment. Nasal washings and  aspirates are unacceptable for Xpert Xpress SARS-CoV-2/FLU/RSV  testing. Fact Sheet for Patients: PinkCheek.be Fact Sheet for Healthcare Providers: GravelBags.it This test is not yet approved or cleared by the [place] FDA and  has been authorized for detection and/or diagnosis of SARS-CoV-2 by  FDA under an Emergency Use Authorization (EUA). This EUA will remain  in effect (meaning this test can be used) for the duration of the  Covid-19 declaration under Section 564(b)(1) of the Act, 21  U.S.C. section 360bbb-3(b)(1), unless the authorization is  terminated or revoked. Performed at [facility], [place] [address], [place], [place] [address]   Lactic acid, plasma     Status: None   Collection Time: 09/01/19  8:27 PM  Result Value Ref Range   Lactic Acid, Venous 1.2 0.5 - 1.9 mmol/L    Comment: Performed at [facility], [place] [address], [place], [place] [address]  CBC     Status: Abnormal   Collection Time: 09/02/19  8:03 AM  Result Value Ref Range   WBC 13.0 (H) 4.0 - 10.5 K/uL   RBC 4.86 3.87 - 5.11 MIL/uL   Hemoglobin 15.2 (H) 12.0 - 15.0 g/dL   HCT 45.6 36.0 - 46.0 %   MCV 93.8 80.0 - 100.0 fL   MCH 31.3 26.0 - 34.0 pg   MCHC 33.3 30.0 - 36.0 g/dL   RDW 12.9 11.5 - 15.5 %   Platelets 270 150 - 400 K/uL   nRBC 0.0 0.0 - 0.2 %    Comment: Performed at [facility], [place] [address], [place],  [address]  Sample to Blood Bank     Status: None   Collection Time: 09/02/19  9:22 AM  Result Value Ref Range    Blood Bank Specimen SAMPLE AVAILABLE FOR TESTING    Sample Expiration  09/05/2019,2359 Performed at [facility], [place] [address] [place], [place] [address]   CBC     Status: Abnormal   Collection Time: 09/03/19  2:29 AM  Result Value Ref Range   WBC 12.5 (H) 4.0 - 10.5 K/uL   RBC 3.97 3.87 - 5.11 MIL/uL   Hemoglobin 12.4 12.0 - 15.0 g/dL   HCT 38.1 36.0 - 46.0 %   MCV 96.0 80.0 - 100.0 fL   MCH 31.2 26.0 - 34.0 pg   MCHC 32.5 30.0 - 36.0 g/dL   RDW 12.8 11.5 - 15.5 %   Platelets 190 150 - 400 K/uL   nRBC 0.0 0.0 - 0.2 %    Comment: Performed at [facility], [place] [address], [place], [place] [id number]  Basic metabolic panel     Status: Abnormal   Collection Time: 09/03/19  2:29 AM  Result Value Ref Range   Sodium 135 135 - 145 mmol/L   Potassium 4.3 3.5 - 5.1 mmol/L   Chloride 103 98 - 111 mmol/L   CO2 24 22 - 32 mmol/L   Glucose, Bld 128 (H) 70 - 99 mg/dL    Comment: Glucose reference range applies only to samples taken after fasting for at least 8 hours.   BUN 16 8 - 23 mg/dL   Creatinine, Ser 0.81 0.44 - 1.00 mg/dL   Calcium 8.3 (L) 8.9 - 10.3 mg/dL   GFR calc non Af Amer >60 >60 mL/min   GFR calc Af Amer >60 >60 mL/min   Anion gap 8 5 - 15    Comment: Performed at [facility], [place] [address], [place], [place] [address]  Magnesium     Status: None   Collection Time: 09/03/19  2:29 AM  Result Value Ref Range   Magnesium 1.7 1.7 - 2.4 mg/dL    Comment: Performed at [facility], [place] [address], [place], [place] [address]    CT ABDOMEN PELVIS W CONTRAST  Result Date: 09/01/2019 CLINICAL DATA:  Abdominal distension EXAM: CT ABDOMEN AND PELVIS WITH CONTRAST TECHNIQUE: Multidetector CT imaging of the abdomen and pelvis was performed using the standard protocol following bolus administration of intravenous contrast. CONTRAST:  126mL OMNIPAQUE IOHEXOL 300 MG/ML  SOLN COMPARISON:  None. FINDINGS:  Lower chest: Bibasilar atelectasis. The heart is enlarged. Hepatobiliary: No focal liver abnormality is seen. No gallstones, gallbladder wall thickening, or biliary dilatation. Pancreas: Unremarkable. No pancreatic ductal dilatation or surrounding inflammatory changes. Spleen: Normal in size without focal abnormality. Adrenals/Urinary Tract: Adrenal glands are unremarkable. Other than a 9 mm renal cyst on the left, the kidneys are normal, without renal calculi, focal lesion, or hydronephrosis. Bladder is unremarkable. Stomach/Bowel: Stomach is within normal limits. There is a right femoral hernia containing a loop of small bowel which results in a high-grade small bowel obstruction. Fluid within the hernia sac is concerning for bowel ischemia/strangulation. The distal small bowel and the large bowel appear normal. Appendix appears normal. Vascular/Lymphatic: Aortic atherosclerosis. No enlarged abdominal or pelvic lymph nodes. Reproductive: A right adnexal cyst measures 4.7 x 3.2 cm. The uterus appears normal. Other: No abdominopelvic ascites. Musculoskeletal: Degenerative changes are seen in the lumbar spine including grade 1 anterolisthesis of L4 on L5. IMPRESSION: 1. Right femoral hernia containing a loop of small bowel which results in a high-grade small bowel obstruction. Fluid within the hernia sac is concerning for bowel ischemia/strangulation. 2. Right adnexal cyst measuring 4.7 cm. Recommend non emergent pelvic ultrasound for further  evaluation. This recommendation follows ACR consensus guidelines: White Paper of the ACR Incidental Findings Committee II on Adnexal Findings. J Am Coll Radiol [phone]. Aortic Atherosclerosis (ICD10-I70.0). These results were called by telephone at the time of interpretation on 09/01/2019 at 6:47 pm to provider [facility], who verbally acknowledged these results. Electronically Signed   By: Zerita Boers M.D.   On: 09/01/2019 18:52    Past Medical History:    Diagnosis Date  . Glaucoma   . Hypercholesteremia   . Hypothyroid   . Osteopenia 06/2016   T score -2.4 stable from prior DEXA.    Past Surgical History:  Procedure Laterality Date  . ABCESS DRAINAGE     INTESTINAL ABCESS  . APPENDECTOMY    . BREAST BIOPSY Right   . BREAST SURGERY     BIOPSY  . cataract surgery    . COLONOSCOPY  2013    Social History   Socioeconomic History  . Marital status: Widowed    Spouse name: Not on file  . Number of children: 3  . Years of education: Not on file  . Highest education level: Not on file  Occupational History  . Not on file  Tobacco Use  . Smoking status: Former Smoker    Quit date: 04/25/1988    Years since quitting: 31.3  . Smokeless tobacco: Never Used  Substance and Sexual Activity  . Alcohol use: Yes    Alcohol/week: 7.0 standard drinks    Types: 7 Standard drinks or equivalent per week  . Drug use: No  . Sexual activity: Yes    Partners: Male    Birth control/protection: Post-menopausal    Comment: 1st intercourse 86 yo-Fewer than 5 partners  Other Topics Concern  . Not on file  Social History Narrative  . Not on file   Social Determinants of Health   Financial Resource Strain:   . Difficulty of Paying Living Expenses:   Food Insecurity:   . Worried About Charity fundraiser in the Last Year:   . Arboriculturist in the Last Year:   Transportation Needs:   . Film/video editor (Medical):   Marland Kitchen Lack of Transportation (Non-Medical):   Physical Activity:   . Days of Exercise per Week:   . Minutes of Exercise per Session:   Stress:   . Feeling of Stress :   Social Connections:   . Frequency of Communication with Friends and Family:   . Frequency of Social Gatherings with Friends and Family:   . Attends Religious Services:   . Active Member of Clubs or Organizations:   . Attends Archivist Meetings:   Marland Kitchen Marital Status:   Intimate Partner Violence:   . Fear of Current or Ex-Partner:   .  Emotionally Abused:   Marland Kitchen Physically Abused:   . Sexually Abused:     Family History  Problem Relation Age of Onset  . Pancreatic cancer Mother   . Colon cancer Neg Hx   . Stomach cancer Neg Hx     Current Facility-Administered Medications  Medication Dose Route Frequency Provider Last Rate Last Admin  . 0.9 %  sodium chloride infusion  250 mL Intravenous PRN Michael Boston, MD      . acetaminophen (TYLENOL) suppository 650 mg  650 mg Rectal Q6H PRN Michael Boston, MD      . acetaminophen (TYLENOL) tablet 325-650 mg  325-650 mg Oral Q6H PRN Michael Boston, MD   650 mg at 09/02/19 1643  .  albumin human 5 % solution 12.5 g  12.5 g Intravenous Q6H PRN Michael Boston, MD      . albuterol (PROVENTIL) (2.5 MG/3ML) 0.083% nebulizer solution 2.5 mg  2.5 mg Nebulization Q6H PRN Michael Boston, MD      . alum & mag hydroxide-simeth (MAALOX/MYLANTA) 200-200-20 MG/5ML suspension 30 mL  30 mL Oral Q6H PRN Michael Boston, MD      . ascorbic acid (VITAMIN C) tablet 500 mg  500 mg Oral BID Michael Boston, MD   500 mg at 09/03/19 0835  . aspirin EC tablet 81 mg  81 mg Oral Carlis Abbott, MD   81 mg at 09/02/19 1853  . bisacodyl (DULCOLAX) suppository 10 mg  10 mg Rectal Q12H PRN Michael Boston, MD      . bupivacaine liposome (EXPAREL) 1.3 % injection 266 mg  20 mL Infiltration Once Michael Boston, MD      . dextrose 5 % and 0.9 % NaCl with KCl 20 mEq/L infusion   Intravenous Continuous Michael Boston, MD 75 mL/hr at 09/03/19 0600 Rate Verify at 09/03/19 0600  . diphenhydrAMINE (BENADRYL) injection 25 mg  25 mg Intravenous Q6H PRN Michael Boston, MD      . dorzolamide (TRUSOPT) 2 % ophthalmic solution 1 drop  1 drop Both Eyes BID Michael Boston, MD   1 drop at 09/03/19 0836  . enalaprilat (VASOTEC) injection 0.625-1.25 mg  0.625-1.25 mg Intravenous Q6H PRN Michael Boston, MD      . enoxaparin (LOVENOX) injection 30 mg  30 mg Subcutaneous Q24H Michael Boston, MD   30 mg at 09/03/19 0836  . feeding supplement (ENSURE  PRE-SURGERY) liquid 296 mL  296 mL Oral Once Michael Boston, MD      . feeding supplement (ENSURE SURGERY) liquid 237 mL  237 mL Oral BID BM Michael Boston, MD   237 mL at 09/03/19 0837  . fentaNYL (SUBLIMAZE) injection 25 mcg  25 mcg Intravenous Q2H PRN Michael Boston, MD   25 mcg at 09/02/19 0744  . guaiFENesin-dextromethorphan (ROBITUSSIN DM) 100-10 MG/5ML syrup 10 mL  10 mL Oral Q4H PRN Michael Boston, MD      . hydrocortisone (ANUSOL-HC) 2.5 % rectal cream 1 application  1 application Topical QID PRN Michael Boston, MD      . hydrocortisone cream 1 % 1 application  1 application Topical TID PRN Michael Boston, MD      . ketorolac (TORADOL) 15 MG/ML injection 15 mg  15 mg Intravenous Q6H PRN Meuth, Brooke A, PA-C   15 mg at 09/02/19 1643  . lactated ringers bolus 1,000 mL  1,000 mL Intravenous Q8H PRN Michael Boston, MD      . levothyroxine (SYNTHROID) tablet 25 mcg  25 mcg Oral Carlis Abbott, MD   25 mcg at 09/03/19 0515  . [START ON 09/04/2019] levothyroxine (SYNTHROID) tablet 50 mcg  50 mcg Oral Carlis Abbott, MD      . lip balm (CARMEX) ointment 1 application  1 application Topical BID Michael Boston, MD   1 application at [id number] [phone]  . magic mouthwash  15 mL Oral QID PRN Michael Boston, MD      . menthol-cetylpyridinium (CEPACOL) lozenge 3 mg  1 lozenge Oral PRN Michael Boston, MD      . methocarbamol (ROBAXIN) 1,000 mg in dextrose 5 % 100 mL IVPB  1,000 mg Intravenous Q6H PRN Michael Boston, MD      . methocarbamol (ROBAXIN) tablet 1,000 mg  1,000 mg Oral Q6H  PRN Michael Boston, MD   1,000 mg at 09/02/19 2134  . metoprolol tartrate (LOPRESSOR) injection 5 mg  5 mg Intravenous Q6H PRN Michael Boston, MD      . ondansetron (ZOFRAN-ODT) disintegrating tablet 4 mg  4 mg Oral Q6H PRN Michael Boston, MD       Or  . ondansetron (ZOFRAN) injection 4 mg  4 mg Intravenous Q6H PRN Michael Boston, MD   4 mg at 09/02/19 0542  . pantoprazole (PROTONIX) injection 40 mg  40 mg Intravenous Ardeen Fillers, MD   40 mg at 09/02/19 2131  . phenol (CHLORASEPTIC) mouth spray 1-2 spray  1-2 spray Mouth/Throat PRN Michael Boston, MD      . polyethylene glycol (MIRALAX / GLYCOLAX) packet 17 g  17 g Oral Q12H PRN Michael Boston, MD      . prochlorperazine (COMPAZINE) injection 5-10 mg  5-10 mg Intravenous Q4H PRN Michael Boston, MD      . psyllium (HYDROCIL/METAMUCIL) packet 1 packet  1 packet Oral BID Michael Boston, MD   1 packet at 09/03/19 [phone]  . rosuvastatin (CRESTOR) tablet 5 mg  5 mg Oral Daily Michael Boston, MD   5 mg at 09/03/19 0835  . simethicone (MYLICON) 40 [phone] suspension 40 mg  40 mg Oral QID PRN Michael Boston, MD      . sodium chloride flush (NS) 0.9 % injection 3 mL  3 mL Intravenous Gorden Harms, MD   3 mL at 09/02/19 2140  . sodium chloride flush (NS) 0.9 % injection 3 mL  3 mL Intravenous PRN Michael Boston, MD         Allergies  Allergen Reactions  . Sulfa Antibiotics Rash    Signed: Wilhemena Durie, PA-S Patient was seen and evaluated in coordination with Kandis Ban, MD   09/03/2019, 8:53 AM

## 2019-09-03 NOTE — Progress Notes (Signed)
Paged Megan Molina. And notified her of patient nausea and abdominal tightness. IV prn meds given for nausea. Patient vomit x1 today. Patient and patient daughter questions and concerns addressed.

## 2019-09-04 LAB — BASIC METABOLIC PANEL
Anion gap: 9 (ref 5–15)
BUN: 17 mg/dL (ref 8–23)
CO2: 27 mmol/L (ref 22–32)
Calcium: 8 mg/dL — ABNORMAL LOW (ref 8.9–10.3)
Chloride: 100 mmol/L (ref 98–111)
Creatinine, Ser: 0.87 mg/dL (ref 0.44–1.00)
GFR calc Af Amer: >60 mL/min (ref >60)
GFR calc non Af Amer: >60 mL/min (ref >60)
Glucose, Bld: 109 mg/dL — ABNORMAL HIGH (ref 70–99)
Potassium: 3.9 mmol/L (ref 3.5–5.1)
Sodium: 136 mmol/L (ref 135–145)

## 2019-09-04 LAB — MAGNESIUM: Magnesium: 2 mg/dL (ref 1.7–2.4)

## 2019-09-04 LAB — CBC
HCT: 37.2 % (ref 36.0–46.0)
Hemoglobin: 12.4 g/dL (ref 12.0–15.0)
MCH: 31.9 pg (ref 26.0–34.0)
MCHC: 33.3 g/dL (ref 30.0–36.0)
MCV: 95.6 fL (ref 80.0–100.0)
Platelets: 187 10*3/uL (ref 150–400)
RBC: 3.89 MIL/uL (ref 3.87–5.11)
RDW: 12.9 % (ref 11.5–15.5)
WBC: 8.6 10*3/uL (ref 4.0–10.5)
nRBC: 0 % (ref 0.0–0.2)

## 2019-09-04 NOTE — Discharge Summary (Signed)
[facility] Discharge Summary   Patient ID: Megan Molina MRN: [id number] DOB/AGE: 26-Aug-1932 86 y.o.  Admit date: 09/01/2019 Discharge date: 09/04/2019  Admitting Diagnosis: Incarcerated right femoral hernia   Discharge Diagnosis RIGHT INCARCERATED FEMORAL HERNIA LEFT FEMORAL HERNIA LEFT INGUINAL HERNIA  Consultants None  Imaging: No results found.  Procedures Dr. Johney Maine (09/02/2019) - LAPAROSCOPIC BILATERAL FEMORAL HERNIA REPAIR, LEFT INGUINAL HERNIA, TAP BLOCK - BILATERAL  Hospital Course:  Megan Molina is an 86yo female who presented to [facility] 5/9 with acute onset abdominal pain, nausea, vomiting, and noticed a lump in her right groin.  CT scan was done that showed an incarcerated right femoral hernia causing a small bowel obstruction.  Hernia was reduced in the ED. Patient was admitted to the surgical service and she underwent procedure listed above on 5/10.  Tolerated procedure well and was transferred to the floor. She was treated for a migraine with tylenol and NSAID. She did initially have some nausea postoperatively, but once this improved her diet was advanced as tolerated.  On POD#2 the patient was voiding well, tolerating diet, ambulating well, pain well controlled, vital signs stable, incisions c/d/i and felt stable for discharge home.  Patient will follow up as below and knows to call with questions or concerns.     Physical Exam: General:  Alert, NAD, pleasant, comfortable Pulm: rate and effort normal Abd:  Soft, ND, NT, +BS, multiple lap incisions C/D/I Skin: warm and dry  Allergies as of 09/04/2019      Reactions   Sulfa Antibiotics Rash      Medication List    TAKE these medications   acetaminophen 500 MG tablet Commonly known as: TYLENOL Take 2 tablets (1,000 mg total) by mouth every 8 (eight) hours as needed for mild pain.   aspirin 81 MG tablet Take 81 mg by mouth every other day.   CALTRATE 600 PO Take by mouth.   dorzolamide 2 % ophthalmic  solution Commonly known as: TRUSOPT Place 1 drop into both eyes 2 (two) times daily.   fish oil-omega-3 fatty acids 1000 MG capsule Take by mouth daily.   ibuprofen 400 MG tablet Commonly known as: ADVIL Take 1-2 tablets (400-800 mg total) by mouth every 6 (six) hours as needed for fever, headache, mild pain or moderate pain.   levothyroxine 50 MCG tablet Commonly known as: SYNTHROID Take 50 mcg by mouth every other day.   levothyroxine 25 MCG tablet Commonly known as: SYNTHROID Take 25 mcg by mouth every other day.   ondansetron 4 MG disintegrating tablet Commonly known as: ZOFRAN-ODT Take 1 tablet (4 mg total) by mouth every 6 (six) hours as needed for nausea.   polyethylene glycol 17 g packet Commonly known as: MIRALAX / GLYCOLAX Take 17 g by mouth every 12 (twelve) hours as needed for mild constipation, moderate constipation or severe constipation.   rosuvastatin 5 MG tablet Commonly known as: CRESTOR Take 5 mg by mouth daily.   VITAMIN B-12 PO Take by mouth.   VITAMIN C PO Take by mouth.   VITAMIN D PO Take by mouth.        Follow-up Information    Michael Boston, MD. Go on 09/25/2019.   Specialty: General Surgery Why: Your appointment is 09/25/19 at 1:30pm Please arrive 30 minutes prior to your appointment to check in and fill out paperwork. Bring photo ID and insurance information. Contact information: [address] [place] [address] [phone]  Audley Hose, MD. Call.   Specialty: Internal Medicine Why: Call to arrange post-hospitalization follow up appointment with your primary care physician Contact information: [place] [address] [phone]           Signed: Wellington Hampshire, [facility] 09/04/2019, 9:40 AM Please see Amion for pager number during day hours 7:00am-4:30pm

## 2019-09-04 NOTE — TOC Progression Note (Signed)
Transition of Care [facility]) - Progression Note    Patient Details  Name: Megan Molina MRN: [id number] Date of Birth: 04/19/1933  Transition of Care [facility]) CM/SW [place], LCSW Phone Number: 09/04/2019, 9:47 AM  Clinical Narrative:    Therapy Plan: [facility] Patient confirm he has a RW and bedside commode.     Barriers to Discharge: No Barriers Identified  Expected Discharge Plan and Services           Expected Discharge Date: 09/04/19               DME Arranged: N/A DME Agency: NA       HH Arranged: PT HH Agency: [facility] (formerly [employer]) Date [place]: 09/04/19 Time [place]: [phone] Representative spoke with at [place]: [place] ([place]) Interventions    Readmission Risk Interventions No flowsheet data found.

## 2019-09-04 NOTE — Care Management Important Message (Signed)
Important Message  Patient Details IM Letter given to [place] Case Manager to present to the Patient Name: Megan Molina MRN: [id number] Date of Birth: 10/12/1932   Medicare Important Message Given:  Yes     Kerin Salen 09/04/2019, 11:02 AM

## 2019-09-10 ENCOUNTER — Ambulatory Visit
Admission: RE | Admit: 2019-09-10 | Discharge: 2019-09-10 | Disposition: A | Discharge status: Home / Self Care | Payer: Medicare PPO | Source: Ambulatory Visit | Attending: Internal Medicine | Admitting: Internal Medicine

## 2019-09-10 ENCOUNTER — Other Ambulatory Visit: Payer: Self-pay

## 2019-09-10 ENCOUNTER — Other Ambulatory Visit: Payer: Self-pay | Admitting: Internal Medicine

## 2019-09-10 DIAGNOSIS — R0600 Dyspnea, unspecified: Secondary | ICD-10-CM

## 2019-11-04 ENCOUNTER — Other Ambulatory Visit: Payer: Self-pay | Admitting: Internal Medicine

## 2019-11-04 DIAGNOSIS — Z1231 Encounter for screening mammogram for malignant neoplasm of breast: Secondary | ICD-10-CM

## 2019-11-12 ENCOUNTER — Telehealth: Payer: Self-pay

## 2019-11-12 NOTE — Telephone Encounter (Signed)
Patient said her Dr. Maia Petties has recommended a pelvic u/s based on CT findings.  CT is in her chart from 09/01/19. "2. Right adnexal cyst measuring 4.7 cm. Recommend non emergent pelvic ultrasound for further evaluation. This recommendation follows ACR consensus guidelines: White Paper of the ACR Incidental Findings Committee II on Adnexal Findings. J Am Coll Radiol [phone]."  Patient said CT showed adnexal mass and she would like to schedule an appointment for u/s and visit with you here.  Ok to place order?

## 2019-11-13 ENCOUNTER — Other Ambulatory Visit: Payer: Self-pay

## 2019-11-13 DIAGNOSIS — N9489 Other specified conditions associated with female genital organs and menstrual cycle: Secondary | ICD-10-CM

## 2019-11-13 NOTE — Telephone Encounter (Signed)
Order placed and staff message sent to [facility] to call and schedule appt.

## 2019-11-13 NOTE — Telephone Encounter (Signed)
Agree with Pelvic US here and visit with me.

## 2019-12-05 ENCOUNTER — Encounter: Payer: Self-pay | Admitting: Obstetrics & Gynecology

## 2019-12-05 ENCOUNTER — Ambulatory Visit (INDEPENDENT_AMBULATORY_CARE_PROVIDER_SITE_OTHER): Payer: Medicare PPO

## 2019-12-05 ENCOUNTER — Ambulatory Visit: Payer: Medicare PPO | Admitting: Obstetrics & Gynecology

## 2019-12-05 ENCOUNTER — Other Ambulatory Visit: Payer: Self-pay

## 2019-12-05 ENCOUNTER — Other Ambulatory Visit: Payer: Self-pay | Admitting: Obstetrics & Gynecology

## 2019-12-05 DIAGNOSIS — N9489 Other specified conditions associated with female genital organs and menstrual cycle: Secondary | ICD-10-CM

## 2019-12-05 DIAGNOSIS — N958 Other specified menopausal and perimenopausal disorders: Secondary | ICD-10-CM

## 2019-12-05 DIAGNOSIS — N8301 Follicular cyst of right ovary: Secondary | ICD-10-CM | POA: Diagnosis not present

## 2019-12-05 DIAGNOSIS — Z780 Asymptomatic menopausal state: Secondary | ICD-10-CM | POA: Diagnosis not present

## 2019-12-05 DIAGNOSIS — N854 Malposition of uterus: Secondary | ICD-10-CM | POA: Diagnosis not present

## 2019-12-05 DIAGNOSIS — N858 Other specified noninflammatory disorders of uterus: Secondary | ICD-10-CM

## 2019-12-05 NOTE — Progress Notes (Signed)
    Megan Molina 1932/07/08 [id number]        86 y.o.  G3P3L3  RP: Right Adnexal Cyst on CT scan for Pelvic US  HPI: Incidental finding of a Rt pelvic cyst on CT scan 09/01/2019 done for abdominal distention.  No pelvic pain.  No PMB.     OB History  Gravida Para Term Preterm AB Living  3 3       3   SAB TAB Ectopic Multiple Live Births               # Outcome Date GA Lbr Len/2nd Weight Sex Delivery Anes PTL Lv  3 Para           2 Para           1 Para             Past medical history,surgical history, problem list, medications, allergies, family history and social history were all reviewed and documented in the EPIC chart.   Directed ROS with pertinent positives and negatives documented in the history of present illness/assessment and plan.  Exam:  There were no vitals filed for this visit. General appearance:  Normal  Pelvic US today: Comparison made with CT scan Sep 01, 2019 when the right adnexal cyst was measured at 4.7 x 3.2 cm.  T/A images.  Patient unable to tolerate vaginal probe.  Small anteverted uterus with peripheral calcifications.  No obvious mass.  The uterus is measured at 4.19 x 4.1 x 2.67 cm.  Small amount of fluid in the endometrial cavity shows regular walls with no intracavitary mass.  Combined wall thickness is measured at 2.68 mm.  Cystic avascular thin-walled cyst noted in the right adnexa.  The cyst is measured at 4.7 x 3.0 cm.  No change in size since the CT scan. The right ovary is questionably identified separate from the mass.  Left ovary is not identified.  No free fluid in the posterior cul-de-sac.  CT Scan 09/01/2019: Reproductive: A right adnexal cyst measures 4.7 x 3.2 cm. The uterus appears normal.   Assessment/Plan:  86 y.o. G3P3   1. Simple adnexal cyst greater than 1 cm in diameter in postmenopausal patient Simple stable avascular thin-walled cyst in the right adnexa measured at 4.7 x 3.0 cm.  Patient reassured that this cyst has all  characteristics of a benign lesion.  After counseling, decision to repeat a pelvic ultrasound in 3 months for reassurance on stability. - US Transvaginal Non-OB; Future  Princess Bruins MD, 10:33 AM 12/05/2019

## 2019-12-10 ENCOUNTER — Other Ambulatory Visit: Payer: Self-pay | Admitting: Obstetrics & Gynecology

## 2019-12-10 ENCOUNTER — Other Ambulatory Visit: Payer: Self-pay

## 2019-12-10 ENCOUNTER — Ambulatory Visit (INDEPENDENT_AMBULATORY_CARE_PROVIDER_SITE_OTHER): Payer: Medicare PPO

## 2019-12-10 DIAGNOSIS — Z780 Asymptomatic menopausal state: Secondary | ICD-10-CM | POA: Diagnosis not present

## 2019-12-10 DIAGNOSIS — M810 Age-related osteoporosis without current pathological fracture: Secondary | ICD-10-CM

## 2019-12-11 ENCOUNTER — Ambulatory Visit
Admission: RE | Admit: 2019-12-11 | Discharge: 2019-12-11 | Disposition: A | Discharge status: Home / Self Care | Payer: Medicare PPO | Source: Ambulatory Visit | Attending: Internal Medicine | Admitting: Internal Medicine

## 2019-12-11 DIAGNOSIS — Z1231 Encounter for screening mammogram for malignant neoplasm of breast: Secondary | ICD-10-CM

## 2019-12-12 ENCOUNTER — Other Ambulatory Visit: Payer: Medicare PPO

## 2019-12-12 ENCOUNTER — Ambulatory Visit: Payer: Medicare PPO | Admitting: Obstetrics & Gynecology

## 2020-01-01 ENCOUNTER — Encounter: Payer: Self-pay | Admitting: Obstetrics & Gynecology

## 2020-01-01 ENCOUNTER — Ambulatory Visit: Payer: Medicare PPO | Admitting: Obstetrics & Gynecology

## 2020-01-01 ENCOUNTER — Other Ambulatory Visit: Payer: Self-pay

## 2020-01-01 DIAGNOSIS — M810 Age-related osteoporosis without current pathological fracture: Secondary | ICD-10-CM

## 2020-01-01 MED ORDER — IBANDRONATE SODIUM 150 MG PO TABS: 150.0000 mg | ORAL_TABLET | ORAL | 4 refills | Status: DC

## 2020-01-02 LAB — VITAMIN D 25 HYDROXY (VIT D DEFICIENCY, FRACTURES): Vit D, 25-Hydroxy: 27 ng/mL — ABNORMAL LOW (ref 30–100)

## 2020-01-02 LAB — CALCIUM: Calcium: 9.6 mg/dL (ref 8.6–10.4)

## 2020-01-06 ENCOUNTER — Encounter: Payer: Self-pay | Admitting: Obstetrics & Gynecology

## 2020-01-06 NOTE — Progress Notes (Signed)
    Megan Molina 1932/07/08 [id number]        86 y.o.  G3P3L3  RP: Counseling on Osteoporosis  HPI: Bone Density 12/10/2019 showed Osteoporosis at the Rt Femoral Neck with a T-Score of -3.0.  Patient is taking Vit D supplements and Ca++.  Physically active.   OB History  Gravida Para Term Preterm AB Living  3 3       3   SAB TAB Ectopic Multiple Live Births               # Outcome Date GA Lbr Len/2nd Weight Sex Delivery Anes PTL Lv  3 Para           2 Para           1 Para             Past medical history,surgical history, problem list, medications, allergies, family history and social history were all reviewed and documented in the EPIC chart.   Directed ROS with pertinent positives and negatives documented in the history of present illness/assessment and plan.  Exam:  There were no vitals filed for this visit. General appearance:  Normal  Bone Density 12/10/2019:  Osteoporosis T-Score -3.0 at the Rt Femoral Neck   Assessment/Plan:  86 y.o. G3P3   1. Age-related osteoporosis without current pathological fracture Osteoporosis on bone density December 10, 2019 with a T score of -3.0 at the right femoral neck.  Patient is on vitamin D supplements and calcium.  Will verify her vitamin D level and her calcium level today.  Recommend increasing weightbearing physical activities every day.  Counseling on osteoporosis and the risks of fracture with minor falls.  Counseling on medications for osteoporosis.  Decision to try Boniva 150 mg tablet per mouth monthly.  Risks, including the risks of necrosis of the jaw especially around major dental procedures, and benefits thoroughly reviewed.  Usage discussed.  Patient voiced understanding and agreement with plan. - VITAMIN D 25 Hydroxy (Vit-D Deficiency, Fractures) - Calcium  Other orders - ibandronate (BONIVA) 150 MG tablet; Take 1 tablet (150 mg total) by mouth every 30 (thirty) days. Take in the morning with a full glass of water, on an  empty stomach, and do not take anything else by mouth or lie down for the next 30 min.  Princess Bruins MD, 10:12 AM 01/01/2020

## 2020-01-21 NOTE — Telephone Encounter (Signed)
Per DPR access note on file I left message on patient voice mail and read her the My Chart result note. I did tell her it returned unread and it is in her My Chart if she wants to review it.

## 2020-04-30 ENCOUNTER — Other Ambulatory Visit: Payer: Self-pay

## 2020-04-30 ENCOUNTER — Ambulatory Visit (INDEPENDENT_AMBULATORY_CARE_PROVIDER_SITE_OTHER): Payer: Medicare PPO

## 2020-04-30 ENCOUNTER — Other Ambulatory Visit: Payer: Self-pay | Admitting: Obstetrics & Gynecology

## 2020-04-30 ENCOUNTER — Ambulatory Visit: Payer: Medicare PPO | Admitting: Obstetrics & Gynecology

## 2020-04-30 ENCOUNTER — Encounter: Payer: Self-pay | Admitting: Obstetrics & Gynecology

## 2020-04-30 VITALS — BP 138/76

## 2020-04-30 DIAGNOSIS — N9489 Other specified conditions associated with female genital organs and menstrual cycle: Secondary | ICD-10-CM

## 2020-04-30 DIAGNOSIS — N958 Other specified menopausal and perimenopausal disorders: Secondary | ICD-10-CM

## 2020-04-30 NOTE — Progress Notes (Signed)
    Megan Molina 01/18/1933 [id number]        87 y.o.  G3P3L3  RP: Rt adnexal Cyst for f/u Pelvic US  HPI: Patient is completely asymptomatic with no pelvic pain.  No PMB.     OB History  Gravida Para Term Preterm AB Living  3 3       3   SAB IAB Ectopic Multiple Live Births               # Outcome Date GA Lbr Len/2nd Weight Sex Delivery Anes PTL Lv  3 Para           2 Para           1 Para             Past medical history,surgical history, problem list, medications, allergies, family history and social history were all reviewed and documented in the EPIC chart.   Directed ROS with pertinent positives and negatives documented in the history of present illness/assessment and plan.  Exam:  Vitals:   04/30/20 0928  BP: 138/76   General appearance:  Normal  Pelvic 06/28/20 today: T/A images (T/V not tolerated).  Anteverted uterus atrophic in size measured at 4.74 x 3.23 x 3.36 cm.  Peripheral calcifications are noted.  No myometrial mass.  The endometrial lining is thin and symmetrical with a small amount of fluid stable since last ultrasound.  No endometrial mass or thickening seen.  The endometrial lining is measured at 1.7 mm.  Normal atrophic ovaries are noted.  A 4.7 x 4.8 x 4.2 cm simple cyst is seen in the right adnexa separate from the right ovary and stable since the previous ultrasound.  No free fluid in the posterior cul-de-sac.   Assessment/Plan:  87 y.o. G3P3   1. Simple adnexal cyst greater than 1 cm in diameter in postmenopausal patient Patient is asymptomatic with no pelvic pain.  Pelvic ultrasound findings thoroughly reviewed with patient.  Patient reassured that the right adnexal cyst is stable and simple.  No free fluid in the pelvis.  Given the benign appearance, will observe.  83 MD, 9:28 AM 04/30/2020

## 2020-05-14 ENCOUNTER — Other Ambulatory Visit: Payer: Self-pay

## 2020-05-14 ENCOUNTER — Encounter (HOSPITAL_COMMUNITY): Payer: Self-pay | Admitting: Emergency Medicine

## 2020-05-14 DIAGNOSIS — Z87891 Personal history of nicotine dependence: Secondary | ICD-10-CM | POA: Insufficient documentation

## 2020-05-14 DIAGNOSIS — Z79899 Other long term (current) drug therapy: Secondary | ICD-10-CM | POA: Diagnosis not present

## 2020-05-14 DIAGNOSIS — E039 Hypothyroidism, unspecified: Secondary | ICD-10-CM | POA: Diagnosis not present

## 2020-05-14 DIAGNOSIS — R1030 Lower abdominal pain, unspecified: Secondary | ICD-10-CM | POA: Insufficient documentation

## 2020-05-14 DIAGNOSIS — R1084 Generalized abdominal pain: Secondary | ICD-10-CM | POA: Diagnosis present

## 2020-05-14 LAB — COMPREHENSIVE METABOLIC PANEL
ALT: 22 U/L (ref 0–44)
AST: 24 U/L (ref 15–41)
Albumin: 4.5 g/dL (ref 3.5–5.0)
Alkaline Phosphatase: 47 U/L (ref 38–126)
Anion gap: 12 (ref 5–15)
BUN: 11 mg/dL (ref 8–23)
CO2: 23 mmol/L (ref 22–32)
Calcium: 9.3 mg/dL (ref 8.9–10.3)
Chloride: 102 mmol/L (ref 98–111)
Creatinine, Ser: 0.75 mg/dL (ref 0.44–1.00)
GFR, Estimated: >60 mL/min (ref >60)
Glucose, Bld: 144 mg/dL — ABNORMAL HIGH (ref 70–99)
Potassium: 3.6 mmol/L (ref 3.5–5.1)
Sodium: 137 mmol/L (ref 135–145)
Total Bilirubin: 1 mg/dL (ref 0.3–1.2)
Total Protein: 7.5 g/dL (ref 6.5–8.1)

## 2020-05-14 LAB — CBC
HCT: 44.7 % (ref 36.0–46.0)
Hemoglobin: 15 g/dL (ref 12.0–15.0)
MCH: 31.3 pg (ref 26.0–34.0)
MCHC: 33.6 g/dL (ref 30.0–36.0)
MCV: 93.1 fL (ref 80.0–100.0)
Platelets: 274 10*3/uL (ref 150–400)
RBC: 4.8 MIL/uL (ref 3.87–5.11)
RDW: 12.6 % (ref 11.5–15.5)
WBC: 10.5 10*3/uL (ref 4.0–10.5)
nRBC: 0 % (ref 0.0–0.2)

## 2020-05-14 LAB — LIPASE, BLOOD: Lipase: 27 U/L (ref 11–51)

## 2020-05-14 NOTE — ED Triage Notes (Signed)
Patient is complaining of abdominal pain that is moving around. This started 05/13/2020. Patient has no other complaints.

## 2020-05-15 ENCOUNTER — Emergency Department (HOSPITAL_COMMUNITY)
Admission: EM | Admit: 2020-05-15 | Discharge: 2020-05-15 | Disposition: A | Discharge status: Home / Self Care | Payer: Medicare PPO | Attending: Emergency Medicine | Admitting: Emergency Medicine

## 2020-05-15 ENCOUNTER — Encounter (HOSPITAL_COMMUNITY): Payer: Self-pay

## 2020-05-15 ENCOUNTER — Emergency Department (HOSPITAL_COMMUNITY): Payer: Medicare PPO

## 2020-05-15 DIAGNOSIS — R1030 Lower abdominal pain, unspecified: Secondary | ICD-10-CM

## 2020-05-15 LAB — URINALYSIS, ROUTINE W REFLEX MICROSCOPIC
Bilirubin Urine: NEGATIVE
Glucose, UA: NEGATIVE mg/dL
Hgb urine dipstick: NEGATIVE
Ketones, ur: NEGATIVE mg/dL
Leukocytes,Ua: NEGATIVE
Nitrite: NEGATIVE
Protein, ur: NEGATIVE mg/dL
Specific Gravity, Urine: 1.017 (ref 1.005–1.030)
pH: 6 (ref 5.0–8.0)

## 2020-05-15 MED ORDER — IOHEXOL 300 MG/ML  SOLN
100.0000 mL | Freq: Once | INTRAMUSCULAR | Status: AC | PRN
  Administered 2020-05-15: 100 mL via INTRAVENOUS

## 2020-05-15 NOTE — ED Provider Notes (Signed)
[facility] DEPT Provider Note   CSN: [id number] Arrival date & time: 05/14/20  1941     History Chief Complaint  Patient presents with  . Abdominal Pain    Megan Molina is a 87 y.o. female.  87 y.o female with a PMH of incarcerated femoral hernia repair in May of last year presents to the ED with a chief complaint of generalized abdominal pain x2 days.  Describes this pain as fullness nonfocal, does report pain was usually alleviated with bowel movements.  States pain worsened yesterday, was not relieved after having a bowel movement.  She called her PCP who recommended milk of magnesia, after taking this she did feel slight improvement.  Reports pain returned 3 hours later, worse before.  He was advised by PCP to be seen in the emergency department for further evaluation via CT imaging.  Last bowel movement was 5 minutes prior to eval, no blood in her stool.  She denies any nausea, vomiting, fever, diarrhea.  No urinary symptoms, no COVID exposures. COVID-19 vaccinations on file.    The history is provided by the patient.  Abdominal Pain Pain location:  Generalized Pain quality: fullness   Pain radiates to:  Does not radiate Pain severity:  Moderate Onset quality:  Gradual Duration:  2 days Timing:  Intermittent Progression:  Waxing and waning Chronicity:  New Context: not alcohol use, not laxative use, not recent illness, not recent travel and not suspicious food intake   Relieved by:  Nothing Worsened by:  Nothing Ineffective treatments:  Acetaminophen and position changes Associated symptoms: no chest pain, no chills, no constipation, no diarrhea, no dysuria, no fever, no hematuria, no nausea, no shortness of breath and no vomiting   Risk factors: being elderly   Risk factors: no recent hospitalization        Past Medical History:  Diagnosis Date  . Glaucoma   . Hypercholesteremia   . Hypothyroid   . Osteopenia 06/2016   T score -2.4  stable from prior DEXA.    Patient Active Problem List   Diagnosis Date Noted  . Incarcerated femoral hernia 09/02/2019  . Femoral hernia of right side with obstruction 09/01/2019    Past Surgical History:  Procedure Laterality Date  . ABCESS DRAINAGE     INTESTINAL ABCESS  . APPENDECTOMY    . BREAST BIOPSY Right   . BREAST SURGERY     BIOPSY  . cataract surgery    . COLONOSCOPY  2013  . INGUINAL HERNIA REPAIR Bilateral 09/02/2019   Procedure: LAPAROSCOPIC BILATERAL HERNIA REPAIR, BILATERAL FEMORAL AND LEFT INGUINAL;  Surgeon: Michael Boston, MD;  Location: [facility] ORS;  Service: General;  Laterality: Bilateral;     OB History    Gravida  3   Para  3   Term      Preterm      AB      Living  3     SAB      IAB      Ectopic      Multiple      Live Births              Family History  Problem Relation Age of Onset  . Pancreatic cancer Mother   . Colon cancer Neg Hx   . Stomach cancer Neg Hx     Social History   Tobacco Use  . Smoking status: Former Smoker    Quit date: 04/25/1988    Years since quitting:  32.0  . Smokeless tobacco: Never Used  Vaping Use  . Vaping Use: Never used  Substance Use Topics  . Alcohol use: Yes    Alcohol/week: 7.0 standard drinks    Types: 7 Standard drinks or equivalent per week  . Drug use: No    Home Medications Prior to Admission medications   Medication Sig Start Date End Date Taking? Authorizing Provider  Ascorbic Acid (VITAMIN C PO) Take by mouth.      [provider]  aspirin 81 MG tablet Take 81 mg by mouth every other day.     [provider]  Calcium Carb-Cholecalciferol (CALCIUM 500 + D3 PO) Take 500 mg by mouth.    [provider]  Cholecalciferol (VITAMIN D PO) Take 2,000 Units by mouth.     [provider]  Cyanocobalamin (VITAMIN B-12 PO) Take by mouth.      [provider]  dorzolamide (TRUSOPT) 2 % ophthalmic solution Place 1 drop into both eyes 2 (two) times  daily.     [provider]  fish oil-omega-3 fatty acids 1000 MG capsule Take by mouth daily.    [provider]  ibandronate (BONIVA) 150 MG tablet Take 1 tablet (150 mg total) by mouth every 30 (thirty) days. Take in the morning with a full glass of water, on an empty stomach, and do not take anything else by mouth or lie down for the next 30 min. 01/01/20   Princess Bruins, MD  levothyroxine (SYNTHROID) 25 MCG tablet Take 25 mcg by mouth every other day.  06/07/19   [provider]  levothyroxine (SYNTHROID) 50 MCG tablet Take 50 mcg by mouth every other day.     [provider]  ondansetron (ZOFRAN-ODT) 4 MG disintegrating tablet Take 1 tablet (4 mg total) by mouth every 6 (six) hours as needed for nausea. 09/03/19   Meuth, Brooke A, PA-C  polyethylene glycol (MIRALAX / GLYCOLAX) 17 g packet Take 17 g by mouth every 12 (twelve) hours as needed for mild constipation, moderate constipation or severe constipation. 09/03/19   Meuth, Brooke A, PA-C  rosuvastatin (CRESTOR) 5 MG tablet Take 5 mg by mouth daily.     [provider]    Allergies    Sulfa antibiotics  Review of Systems   Review of Systems  Constitutional: Negative for chills and fever.  Respiratory: Negative for shortness of breath.   Cardiovascular: Negative for chest pain.  Gastrointestinal: Positive for abdominal pain. Negative for constipation, diarrhea, nausea and vomiting.  Genitourinary: Negative for dysuria and hematuria.  Neurological: Negative for light-headedness and headaches.  All other systems reviewed and are negative.   Physical Exam Updated Vital Signs BP (!) 163/64 (BP Location: Right Arm)   Pulse 82   Temp 97.7 F (36.5 C) (Oral)   Resp 16   Ht 5\' 6"  (1.676 m)   Wt 54.4 kg   SpO2 100%   BMI 19.37 kg/m   Physical Exam Vitals and nursing note reviewed.  Constitutional:      General: She is not in acute distress.    Appearance: She is well-developed and  well-nourished.  HENT:     Head: Normocephalic and atraumatic.     Mouth/Throat:     Mouth: Oropharynx is clear and moist.     Pharynx: No oropharyngeal exudate.  Eyes:     Pupils: Pupils are equal, round, and reactive to light.  Cardiovascular:     Rate and Rhythm: Regular rhythm.  Heart sounds: Normal heart sounds.     Comments: No bilateral pitting edema. Pulmonary:     Effort: Pulmonary effort is normal. No respiratory distress.     Breath sounds: Normal breath sounds.  Abdominal:     General: Bowel sounds are normal. There is no distension.     Palpations: Abdomen is soft.     Tenderness: There is abdominal tenderness in the right lower quadrant, suprapubic area and left lower quadrant. There is no right CVA tenderness or left CVA tenderness.     Comments: Bowel sounds are present, mild tenderness noted with palpation of the suprapubic region.  Musculoskeletal:        General: No tenderness or deformity.     Cervical back: Normal range of motion.     Right lower leg: No edema.     Left lower leg: No edema.  Skin:    General: Skin is warm and dry.  Neurological:     Mental Status: She is alert and oriented to person, place, and time.  Psychiatric:        Mood and Affect: Mood and affect normal.     ED Results / Procedures / Treatments   Labs (all labs ordered are listed, but only abnormal results are displayed) Labs Reviewed  COMPREHENSIVE METABOLIC PANEL - Abnormal; Notable for the following components:      Result Value   Glucose, Bld 144 (*)    All other components within normal limits  LIPASE, BLOOD  CBC  URINALYSIS, ROUTINE W REFLEX MICROSCOPIC    EKG None  Radiology CT ABDOMEN PELVIS W CONTRAST  Result Date: 05/15/2020 CLINICAL DATA:  Abdominal pain EXAM: CT ABDOMEN AND PELVIS WITH CONTRAST TECHNIQUE: Multidetector CT imaging of the abdomen and pelvis was performed using the standard protocol following bolus administration of intravenous contrast.  CONTRAST:  160mL OMNIPAQUE IOHEXOL 300 MG/ML  SOLN COMPARISON:  Sep 01, 2019 FINDINGS: Lower chest: There is atelectatic change in the left base. No lung base edema or consolidation. Hepatobiliary: There is fatty infiltration near the fissure for the ligamentum teres. Beyond fatty infiltration, no focal liver lesions are evident. Gallbladder wall is not appreciably thickened. There is no biliary duct dilatation. Pancreas: There is no pancreatic mass or inflammatory focus. Spleen: No splenic lesions are evident. Adrenals/Urinary Tract: Adrenals bilaterally appear normal. There is a cyst in the anterior left mid kidney measuring 0.9 x 0.8 cm. There is no evident hydronephrosis on either side. There is no demonstrable renal or ureteral calculus on either side. Urinary bladder is midline with wall thickness within normal limits. Stomach/Bowel: There is no appreciable bowel wall or mesenteric thickening. No evident bowel obstruction. Terminal ileum appears normal. Apparent interval repair of right femoral hernia since prior study. Vascular/Lymphatic: No abdominal aortic aneurysm. There is aortic and iliac artery atherosclerosis. Major venous structures appear patent. There is no adenopathy in the abdomen or pelvis. Reproductive: The uterus is anteverted. There is again noted a right adnexal cyst measuring 5.0 x 3.7 x 4.2 cm, similar to prior study. No new adnexal mass. Other: Appendix absent. No periappendiceal region inflammation. No abscess or ascites evident in the abdomen or pelvis. Musculoskeletal: There is degenerative change in the lumbar spine. No blastic or lytic bone lesions. No intramuscular lesions are evident. IMPRESSION: 1. Stable right adnexal cyst measuring 5.0 x 3.7 x 4.2 cm. Recommend follow-up US in 6-12 months per consensus guidelines. Note: This recommendation does not apply to premenarchal patients and to those with increased risk (genetic,  family history, elevated tumor markers or other high-risk  factors) of ovarian cancer. Reference: JACR 2020 Feb; 17(2):248-254. Note that a mass of this nature potentially places patient's right ovary increased risk for torsion. If there are findings suggesting potential torsion, pelvic ultrasound at this time including Doppler assessment would be reasonable. 2. No evident bowel obstruction. No bowel containing hernia. No abscess in the abdomen or pelvis. Appendix absent. 3. No evident renal or ureteral calculus. No hydronephrosis. Urinary bladder wall thickness normal. 4.  Aortic Atherosclerosis (ICD10-I70.0). Electronically Signed   By: Lowella Grip III M.D.   On: 05/15/2020 08:20    Procedures Procedures (including critical care time)  Medications Ordered in ED Medications  iohexol (OMNIPAQUE) 300 MG/ML solution 100 mL (100 mLs Intravenous Contrast Given 05/15/20 0744)    ED Course  I have reviewed the triage vital signs and the nursing notes.  Pertinent labs & imaging results that were available during my care of the patient were reviewed by me and considered in my medical decision making (see chart for details).    MDM Rules/Calculators/A&P  Patient with a past medical history of incarcerated femoral hernia last year presents to the ED with a chief complaint of suprapubic abdominal pain for the past 2 days.  Has taken milk of magnesia without improvement in symptoms.  No fever, nausea, vomiting, blood in her stool, urinary symptoms.  Reports calling PCP, taking some milk of magnesia without improvement in her symptoms.  Had similar episode in the past which is usually relieved with bowel movements.  Had a bowel movement 5 minutes prior to evaluation without any blood in her stool.  No concern for constipation at this time, no factious symptoms, no fevers.  Evaluation she is not ill, nontoxic-appearing, abdomen is soft to palpation, bowel sounds are present to auscultation, mild tenderness along the suprapubic region.  Afebrile during her visit.   No bilateral pitting edema.  We discussed further imaging.  Labs such as CBC without any leukocytosis, hemoglobin stable.  Lipase level is within normal limits.  CMP without any electrolyte abnormality, glucose is slightly elevated, no prior history of diabetes.  Creatinine function within normal limits.  LFTs are unremarkable.  UA without any nitrites, or leukocytes.  CT Abdomen/Pelvis showed:  1. Stable right adnexal cyst measuring 5.0 x 3.7 x 4.2 cm. Recommend  follow-up US in 6-12 months per consensus guidelines. Note: This  recommendation does not apply to premenarchal patients and to those  with increased risk (genetic, family history, elevated tumor markers  or other high-risk factors) of ovarian cancer. Reference: JACR 2020  Feb; 17(2):248-254. Note that a mass of this nature potentially  places patient's right ovary increased risk for torsion. If there  are findings suggesting potential torsion, pelvic ultrasound at this  time including Doppler assessment would be reasonable.   Results not consistent with any obstruction, diverticulitis, constipation, other acute pathology.  Due to the size of cyst, further recommendations were obtained from gynecology specialist.    10:13 AM Spoke to Dr. Ilda Basset, gynecology on-call, who reviewed prior ultrasound which showed a similar cyst.  Commendations were made for outpatient gynecology follow-up, likely will need removal of cyst that she is not symptomatic at this time.  Further chart review from patient, was evaluated by gynecologist Dr. Princess Bruins on April 30, 2018, for a pelvic ultrasound to further evaluate the right adnexal cyst.  This had similar findings as with today's imaging.  Will provide patient with CT results at this time,  she will need to further follow-up with gynecology as an outpatient basis.  She was not provided with any pain medication while in the ED, she reports pain had subsided.  Vitals remained stable during her  visit.  She is stable for discharge.  I have discussed case with Dr. Marsh Dolly who has seen patient and agrees with plan and management.   Portions of this note were generated with [employer]. Dictation errors may occur despite best attempts at proofreading.  Final Clinical Impression(s) / ED Diagnoses Final diagnoses:  Lower abdominal pain    Rx / DC Orders ED Discharge Orders    None       Janeece Fitting, PA-C 05/15/20 [place], DO 05/15/20 1046

## 2020-05-15 NOTE — Discharge Instructions (Addendum)
We discussed the results of your CT on todays visit. The results are attached to your chart.   You will need to follow up with GYN for further management of your right ovarian cyst.

## 2020-07-07 ENCOUNTER — Encounter: Payer: Self-pay | Admitting: Podiatry

## 2020-07-07 ENCOUNTER — Other Ambulatory Visit: Payer: Self-pay

## 2020-07-07 ENCOUNTER — Ambulatory Visit: Payer: Medicare PPO | Admitting: Podiatry

## 2020-07-07 DIAGNOSIS — B351 Tinea unguium: Secondary | ICD-10-CM

## 2020-07-07 DIAGNOSIS — L600 Ingrowing nail: Secondary | ICD-10-CM

## 2020-07-07 DIAGNOSIS — L603 Nail dystrophy: Secondary | ICD-10-CM | POA: Diagnosis not present

## 2020-07-07 NOTE — Patient Instructions (Signed)

## 2020-07-07 NOTE — Progress Notes (Signed)
  Subjective:  Patient ID: Megan Molina, female    DOB: 09/24/1932,  MRN: [id number]  Chief Complaint  Patient presents with  . Ingrown Toenail    Right hallux     87 y.o. female presents with the above complaint. History confirmed with patient.  Has had some changes in the nail which thickening and a ridge growing across both the nail plate.  Is not sure if she ever injured this.  Objective:  Physical Exam: warm, good capillary refill, no trophic changes or ulcerative lesions, normal DP and PT pulses and normal sensory exam.   Right Foot: Right hallux nail thickened elongated dystrophic with a central ridge and what appears to be a separate nail growing from the nail plate  Assessment:   1. Onychodystrophy   2. Onychomycosis   3. Ingrowing right great toenail      Plan:  Patient was evaluated and treated and all questions answered.    Ingrown Nail, right -Patient elects to proceed with minor surgery to remove toenail today. Consent reviewed and signed by patient. -Nail excised. See procedure note. -Educated on post-procedure care including soaking. Written instructions provided and reviewed. -Patient to follow up in 6 months for nail check.  If still bothersome we will consider partial permanent avulsion  Procedure: Excision of Ingrown Toenail Location: Right 1st toe nail . Anesthesia: Lidocaine 1% plain; 1.5 mL and Marcaine 0.5% plain; 1.5 mL, digital block. Skin Prep: Betadine. Dressing: Silvadene; telfa; dry, sterile, compression dressing. Technique: Following skin prep, the toe was exsanguinated and a tourniquet was secured at the base of the toe. The affected nail was freed and excised. The tourniquet was then removed and sterile dressing applied. Disposition: Patient tolerated procedure well.    Return in about 6 months (around 01/07/2021) for nail re-check.

## 2020-07-08 ENCOUNTER — Telehealth: Payer: Self-pay | Admitting: Podiatry

## 2020-07-08 NOTE — Telephone Encounter (Signed)
Left voice message to inform patient to soak and bandage for 10 days and if anything arises to give our office a call.

## 2020-08-21 ENCOUNTER — Encounter: Payer: Medicare PPO | Admitting: Obstetrics & Gynecology

## 2020-08-21 ENCOUNTER — Encounter: Payer: Medicare PPO | Admitting: Obstetrics and Gynecology

## 2020-08-24 ENCOUNTER — Other Ambulatory Visit: Payer: Self-pay

## 2020-08-24 ENCOUNTER — Ambulatory Visit (INDEPENDENT_AMBULATORY_CARE_PROVIDER_SITE_OTHER): Payer: Medicare PPO | Admitting: Obstetrics & Gynecology

## 2020-08-24 ENCOUNTER — Encounter: Payer: Self-pay | Admitting: Obstetrics & Gynecology

## 2020-08-24 VITALS — BP 122/80 | Ht 65.0 in | Wt 128.0 lb

## 2020-08-24 DIAGNOSIS — Z01419 Encounter for gynecological examination (general) (routine) without abnormal findings: Secondary | ICD-10-CM | POA: Diagnosis not present

## 2020-08-24 DIAGNOSIS — M810 Age-related osteoporosis without current pathological fracture: Secondary | ICD-10-CM

## 2020-08-24 DIAGNOSIS — N958 Other specified menopausal and perimenopausal disorders: Secondary | ICD-10-CM | POA: Diagnosis not present

## 2020-08-24 DIAGNOSIS — Z780 Asymptomatic menopausal state: Secondary | ICD-10-CM | POA: Diagnosis not present

## 2020-08-24 DIAGNOSIS — N9489 Other specified conditions associated with female genital organs and menstrual cycle: Secondary | ICD-10-CM

## 2020-08-24 MED ORDER — IBANDRONATE SODIUM 150 MG PO TABS: 150.0000 mg | ORAL_TABLET | ORAL | 4 refills | Status: DC

## 2020-08-24 NOTE — Progress Notes (Signed)
Megan Molina [birth year] [id number]   History:    87 y.o. G3P3L3 Widowed x 03/2019.  English as a second language teacher.  RP:  Established patient presenting for annual gyn exam   HPI: Postmenopause, well on no HRT.  No PMB.  No pelvic pain.  Rt simple ovarian cyst 4.8 cm 04/2020.  Abstinent.  Urine/BMs normal.  Breasts normal.  Osteoporosis, last BD 11/2019.  Well on Boniva.  BMI 21.3.  Health Labs with Fam MD.  Colono 2013.    Past medical history,surgical history, family history and social history were all reviewed and documented in the EPIC chart.  Gynecologic History No LMP recorded. Patient is postmenopausal.  Obstetric History OB History  Gravida Para Term Preterm AB Living  3 3       3   SAB IAB Ectopic Multiple Live Births               # Outcome Date GA Lbr Len/2nd Weight Sex Delivery Anes PTL Lv  3 Para           2 Para           1 Para              ROS: A ROS was performed and pertinent positives and negatives are included in the history.  GENERAL: No fevers or chills. HEENT: No change in vision, no earache, sore throat or sinus congestion. NECK: No pain or stiffness. CARDIOVASCULAR: No chest pain or pressure. No palpitations. PULMONARY: No shortness of breath, cough or wheeze. GASTROINTESTINAL: No abdominal pain, nausea, vomiting or diarrhea, melena or bright red blood per rectum. GENITOURINARY: No urinary frequency, urgency, hesitancy or dysuria. MUSCULOSKELETAL: No joint or muscle pain, no back pain, no recent trauma. DERMATOLOGIC: No rash, no itching, no lesions. ENDOCRINE: No polyuria, polydipsia, no heat or cold intolerance. No recent change in weight. HEMATOLOGICAL: No anemia or easy bruising or bleeding. NEUROLOGIC: No headache, seizures, numbness, tingling or weakness. PSYCHIATRIC: No depression, no loss of interest in normal activity or change in sleep pattern.     Exam:   BP 122/80 (BP Location: Right Arm, Patient Position: Sitting, Cuff Size: Normal)   Ht 5\' 5"  (1.651 m)   Wt  128 lb (58.1 kg)   BMI 21.30 kg/m   Body mass index is 21.3 kg/m.  General appearance : Well developed well nourished female. No acute distress HEENT: Eyes: no retinal hemorrhage or exudates,  Neck supple, trachea midline, no carotid bruits, no thyroidmegaly Lungs: Clear to auscultation, no rhonchi or wheezes, or rib retractions  Heart: Regular rate and rhythm, no murmurs or gallops Breast:Examined in sitting and supine position were symmetrical in appearance, no palpable masses or tenderness,  no skin retraction, no nipple inversion, no nipple discharge, no skin discoloration, no axillary or supraclavicular lymphadenopathy Abdomen: no palpable masses or tenderness, no rebound or guarding Extremities: no edema or skin discoloration or tenderness  Pelvic: Vulva: Normal             Vagina: No gross lesions or discharge  Cervix: No gross lesions or discharge  Uterus  AV, normal size, shape and consistency, non-tender and mobile  Adnexa  Without masses or tenderness  Anus: Normal  Pelvic US 04/2020:  Rt ovarian simple cyst 4.8 cm.   Assessment/Plan:  87 y.o. female for annual exam   1. Well female exam with routine gynecological exam Normal gynecologic exam in menopause.  Right ovarian cyst not felt on exam.  We will repeat a pelvic ultrasound  January 2023.  Breast exam normal.  Screening mammogram August 2021.  Colonoscopy 2013.  Health labs with family physician.  Body mass index 21.3.  Continue with fitness and healthy nutrition.  2. Postmenopause Well on no hormone replacement therapy.  No postmenopausal bleeding.  3. Simple adnexal cyst greater than 1 cm in diameter in postmenopausal patient Simple right ovarian cyst measured at 4.8 cm January 2022 ultrasound.  Patient is asymptomatic and the cyst is not felt on exam.  We will repeat a pelvic ultrasound January 2023. - US Transvaginal Non-OB; Future in 04/2021  4. Age-related osteoporosis without current pathological fracture BD  Osteoporosis 11/2019.  Well on Boniva.  Represcribed.  Continue with vitamin D supplements, calcium intake of 1.5 g/day total and regular weightbearing physical activities.  Princess Bruins MD, 1:56 PM 08/24/2020

## 2020-09-02 ENCOUNTER — Other Ambulatory Visit: Payer: Self-pay | Admitting: Internal Medicine

## 2020-09-02 DIAGNOSIS — R0989 Other specified symptoms and signs involving the circulatory and respiratory systems: Secondary | ICD-10-CM

## 2020-09-28 ENCOUNTER — Ambulatory Visit
Admission: RE | Admit: 2020-09-28 | Discharge: 2020-09-28 | Disposition: A | Discharge status: Home / Self Care | Payer: Medicare PPO | Source: Ambulatory Visit | Attending: Internal Medicine | Admitting: Internal Medicine

## 2020-09-28 DIAGNOSIS — R0989 Other specified symptoms and signs involving the circulatory and respiratory systems: Secondary | ICD-10-CM

## 2020-10-20 ENCOUNTER — Other Ambulatory Visit: Payer: Self-pay | Admitting: Internal Medicine

## 2020-10-20 DIAGNOSIS — Z1231 Encounter for screening mammogram for malignant neoplasm of breast: Secondary | ICD-10-CM

## 2020-12-14 ENCOUNTER — Other Ambulatory Visit: Payer: Self-pay

## 2020-12-14 ENCOUNTER — Ambulatory Visit
Admission: RE | Admit: 2020-12-14 | Discharge: 2020-12-14 | Disposition: A | Discharge status: Home / Self Care | Payer: Medicare PPO | Source: Ambulatory Visit | Attending: Internal Medicine | Admitting: Internal Medicine

## 2020-12-14 DIAGNOSIS — Z1231 Encounter for screening mammogram for malignant neoplasm of breast: Secondary | ICD-10-CM

## 2020-12-29 ENCOUNTER — Ambulatory Visit: Payer: Medicare PPO | Admitting: Podiatry

## 2021-01-07 ENCOUNTER — Ambulatory Visit: Payer: Medicare PPO | Admitting: Podiatry

## 2021-01-20 ENCOUNTER — Other Ambulatory Visit: Payer: Self-pay

## 2021-01-20 ENCOUNTER — Ambulatory Visit: Payer: Medicare PPO | Admitting: Podiatry

## 2021-01-20 DIAGNOSIS — L603 Nail dystrophy: Secondary | ICD-10-CM | POA: Diagnosis not present

## 2021-01-20 NOTE — Progress Notes (Signed)
  Subjective:  Patient ID: Megan Molina, female    DOB: [birth year],  MRN: [id number]  Chief Complaint  Patient presents with   Nail Problem     for nail re-check    87 y.o. female presents with the above complaint. History confirmed with patient.  Nail seems to be growing back, she dropped a suitcase on the L hallux nail   Objective:  Physical Exam: warm, good capillary refill, no trophic changes or ulcerative lesions, normal DP and PT pulses and normal sensory exam.   Right Foot: Right hallux nail  growing back Left hallux nail with small area of bruising   Assessment:   1. Onychodystrophy      Plan:  Patient was evaluated and treated and all questions answered.    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

## 2021-01-20 NOTE — Patient Instructions (Signed)
Look for double action nail nippers on [employer]

## 2021-03-24 IMAGING — CR DG CHEST 2V
2 series · 2 of 2 positions shown · non-contrast
Comparison: None.

CLINICAL DATA: Shortness of breath times 3-4 days following surgery
last week.

EXAM:
CHEST - 2 VIEW

[w chest pa]
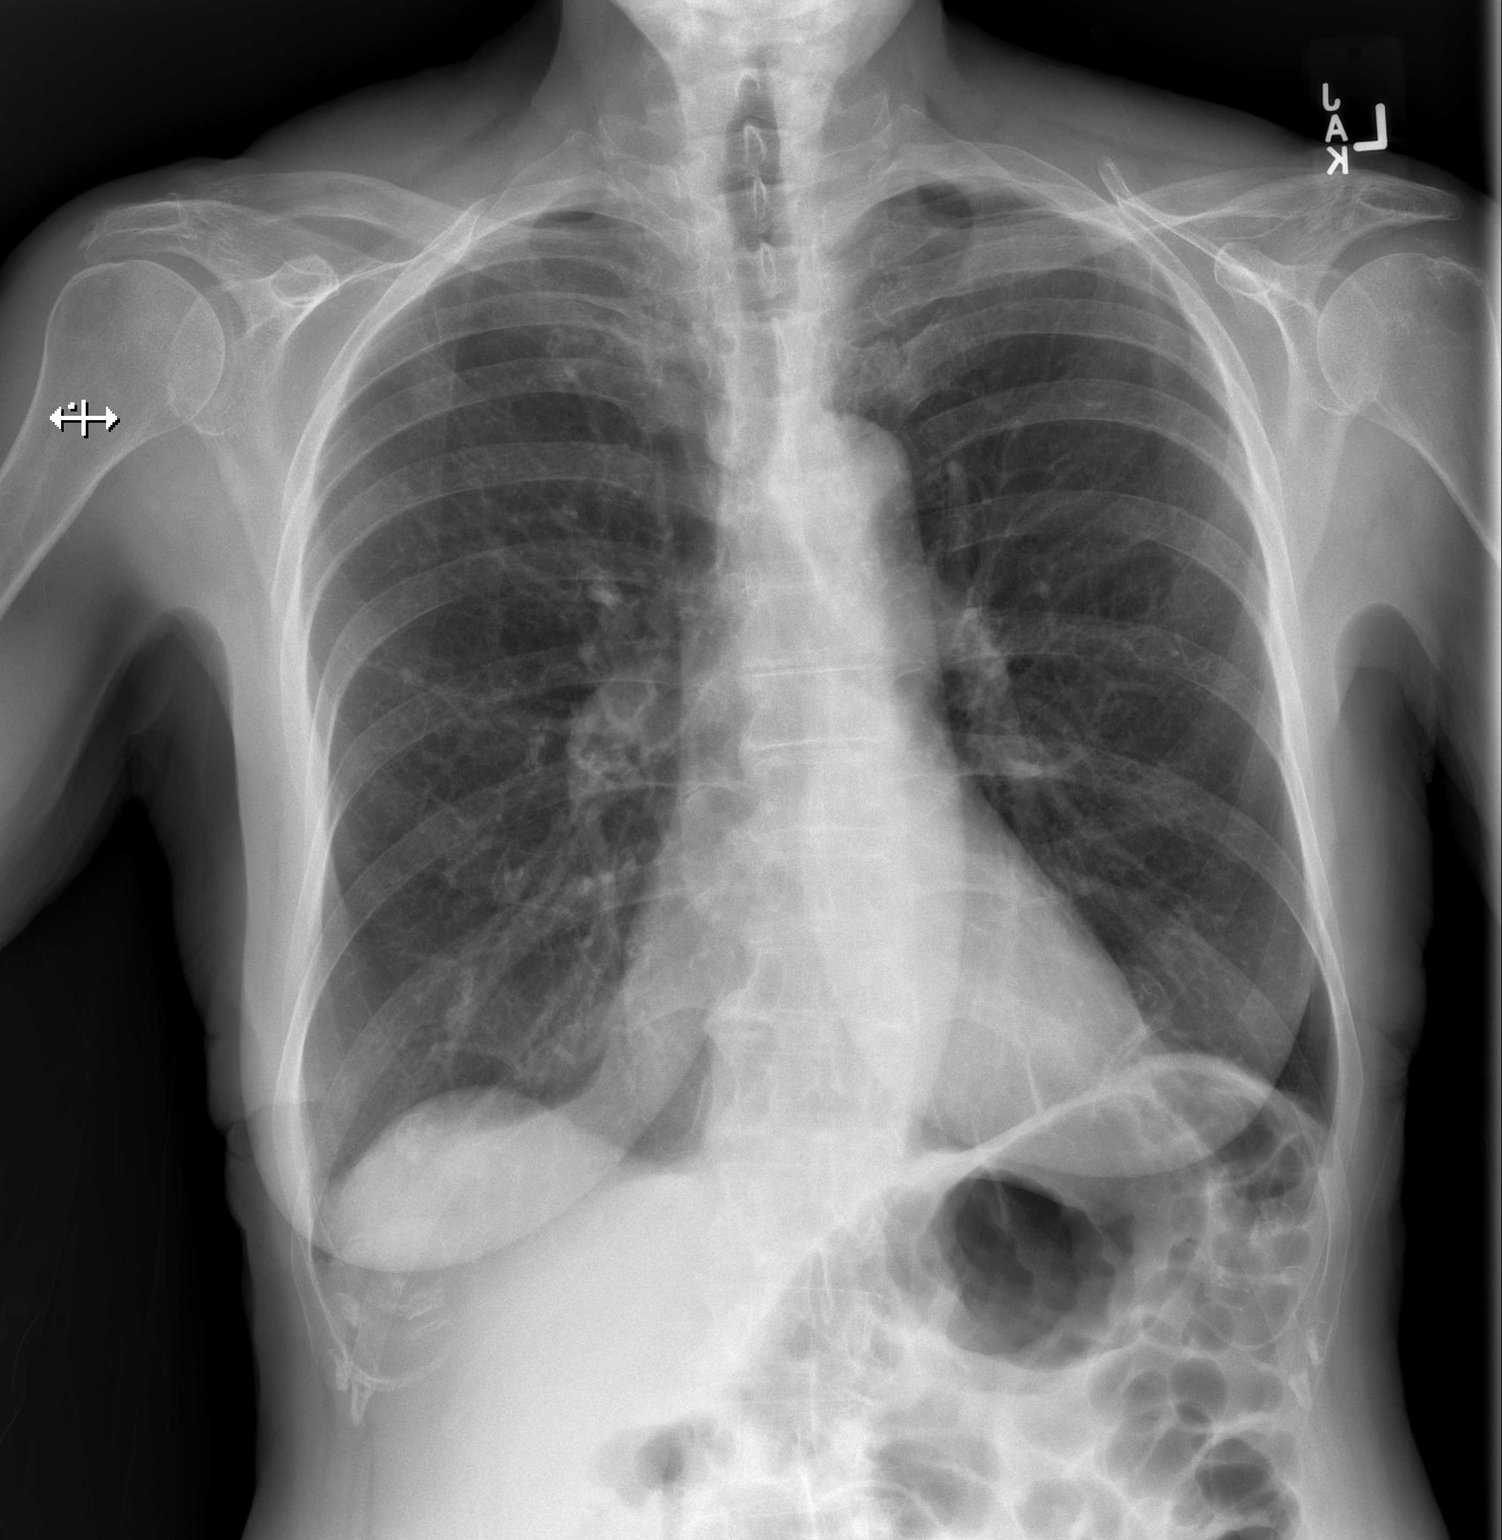

[w chest lat]
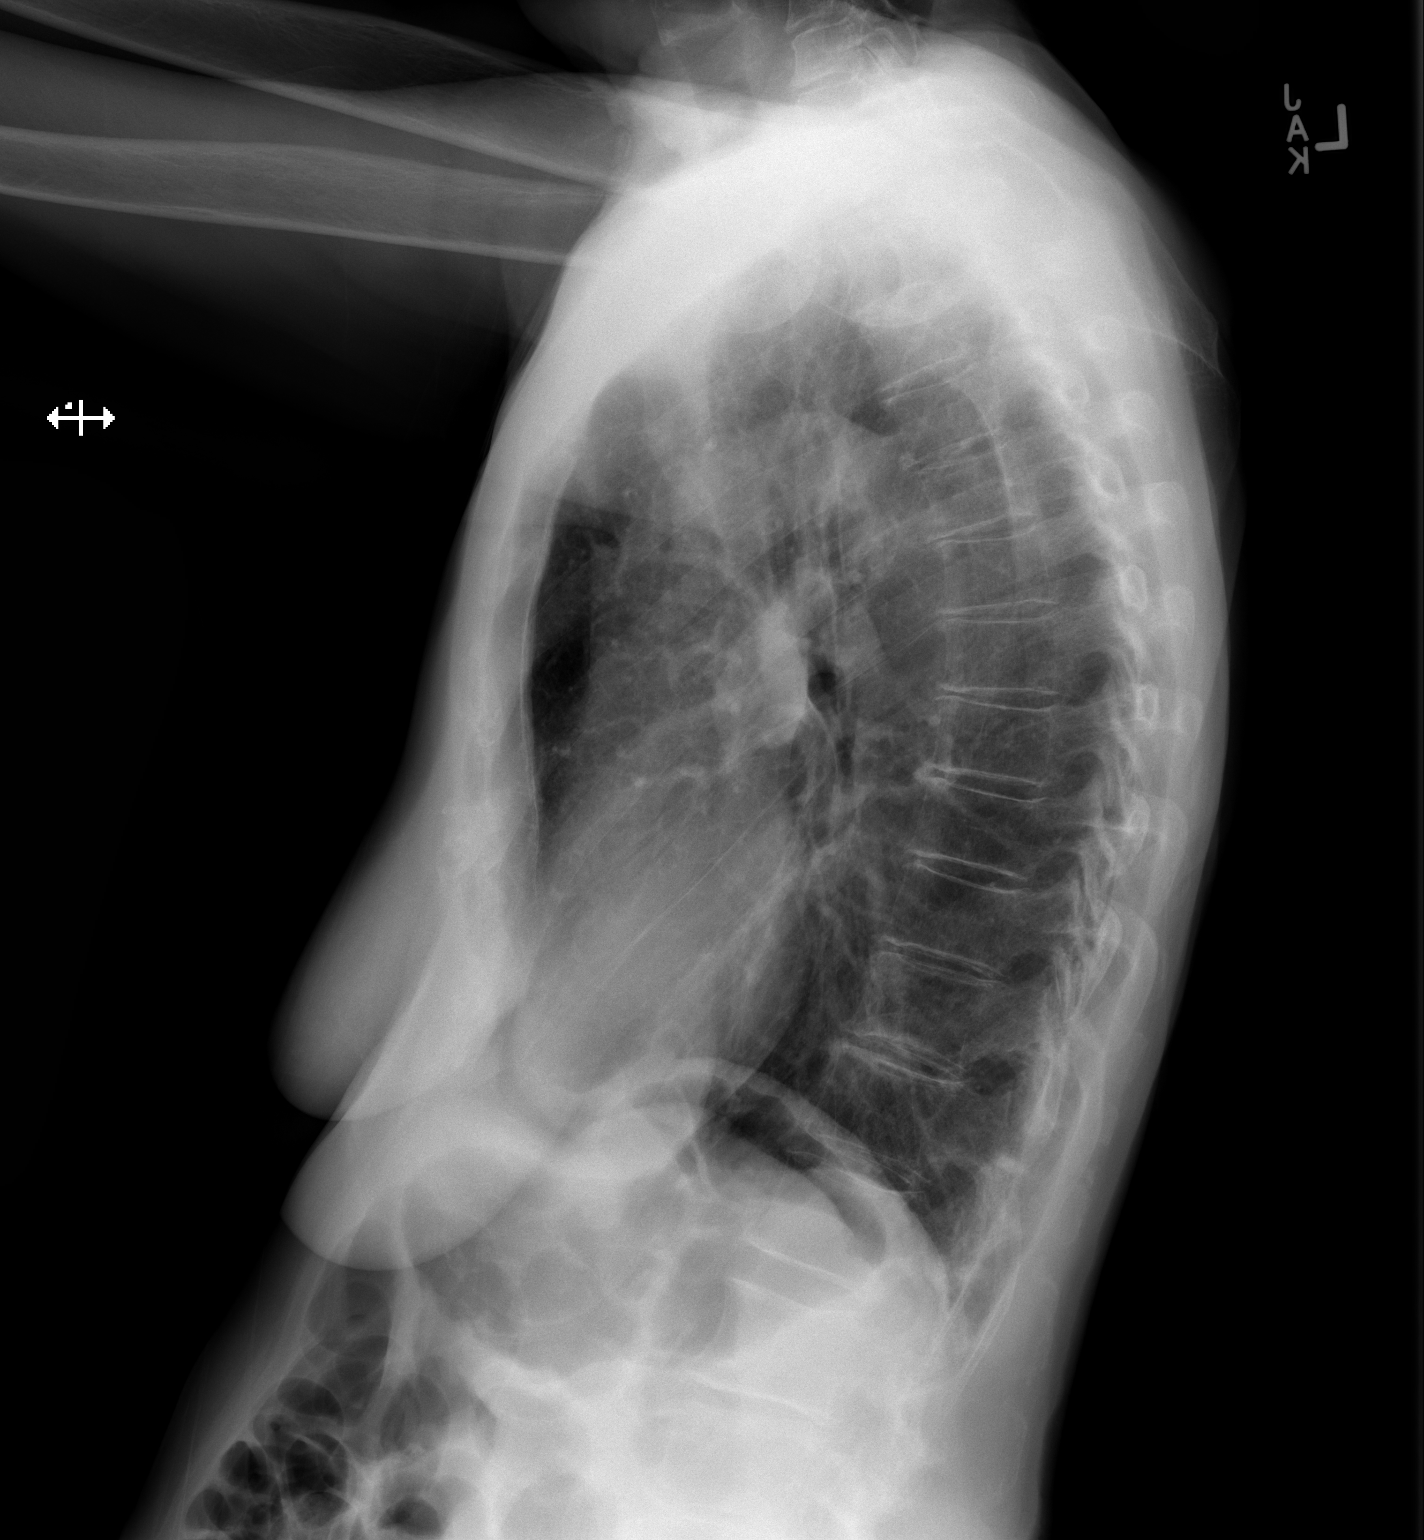

[2 of 2 positions shown; findings below may reference images not displayed]

FINDINGS: Mild, chronic appearing increased lung markings are seen without
evidence of acute infiltrate, pleural effusion or pneumothorax. The
heart size and mediastinal contours are within normal limits. Mild
degenerative changes seen within the lower thoracic spine.
IMPRESSION: No evidence of acute or active cardiopulmonary disease.

## 2021-05-03 ENCOUNTER — Encounter: Payer: Self-pay | Admitting: Physical Therapy

## 2021-05-03 ENCOUNTER — Ambulatory Visit: Payer: Medicare PPO | Attending: Internal Medicine | Admitting: Physical Therapy

## 2021-05-03 ENCOUNTER — Other Ambulatory Visit: Payer: Self-pay

## 2021-05-03 DIAGNOSIS — R2689 Other abnormalities of gait and mobility: Secondary | ICD-10-CM | POA: Insufficient documentation

## 2021-05-03 DIAGNOSIS — R2681 Unsteadiness on feet: Secondary | ICD-10-CM | POA: Insufficient documentation

## 2021-05-03 NOTE — Therapy (Signed)
[facility] [address] [place], [place], [address] Phone: [phone]   Fax:  [phone]  Physical Therapy Evaluation  Patient Details  Name: Megan Molina MRN: [id number] Date of Birth: June 06, 1932 Referring Provider (PT): Dr. Maia Petties   Encounter Date: 05/03/2021   PT End of Session - 05/03/21 1002     Visit Number 1    Number of Visits 9    Date for PT Re-Evaluation 07/02/21    Authorization Type [employer] - Will need auth    PT Start Time 0918    PT Stop Time 0957    PT Time Calculation (min) 39 min    Equipment Utilized During Treatment Gait belt    Activity Tolerance Patient tolerated treatment well    Behavior During Therapy [facility] for tasks assessed/performed             Past Medical History:  Diagnosis Date   Glaucoma    Hypercholesteremia    Hypothyroid    Osteopenia 06/2016   T score -2.4 stable from prior DEXA.    Past Surgical History:  Procedure Laterality Date   ABCESS DRAINAGE     INTESTINAL ABCESS   APPENDECTOMY     BREAST BIOPSY Right    BREAST SURGERY     BIOPSY   cataract surgery     COLONOSCOPY  2013   INGUINAL HERNIA REPAIR Bilateral 09/02/2019   Procedure: LAPAROSCOPIC BILATERAL HERNIA REPAIR, BILATERAL FEMORAL AND LEFT INGUINAL;  Surgeon: Michael Boston, MD;  Location: [facility];  Service: General;  Laterality: Bilateral;    There were no vitals filed for this visit.    Subjective Assessment - 05/03/21 0921     Subjective Does not think that she walks very well. Feels a little bit off balance and a little wobbly. Started about a year or 2 ago. Had a fall a couple months ago when trying to take out the trash, otherwise no falls. Tries to do some yoga at home.    Pertinent History PMH: Glaucoma, osteoporosis, hypothyroidism.    Limitations Walking    Patient Stated Goals Wants to see if she can walk better.    Currently in Pain? No/denies                [facility] PT  Assessment - 05/03/21 0925       Assessment   Medical Diagnosis Gait abnormality    Referring Provider (PT) Dr. Maia Petties    Onset Date/Surgical Date 04/05/21   date of referral   Hand Dominance Right    Prior Therapy Only PT for a previous shoulder injury      Precautions   Precautions Fall      Balance Screen   Has the patient fallen in the past 6 months Yes    How many times? 1    Has the patient had a decrease in activity level because of a fear of falling?  No    Is the patient reluctant to leave their home because of a fear of falling?  No      Home Environment   Living Environment Private residence    Living Arrangements Alone    Type of [place] Access Stairs to enter    Entrance Stairs-Number of Steps 5    Entrance Stairs-Rails Can reach both    Home Layout Two level    Alternate Level Stairs-Number of Steps 12    Alternate Level Stairs-Rails Can reach both  Home Equipment None      Prior Function   Level of Independence Independent    Leisure Plays the piano, teaches piano lessions, house and yard work      Sensation   Light Touch Appears Intact      Coordination   Gross Motor Movements are Fluid and Coordinated Yes      ROM / Strength   AROM / PROM / Strength Strength      Strength   Strength Assessment Site Hip;Knee;Ankle    Right/Left Hip Right;Left    Right Hip Flexion 5/5    Right Hip ABduction 4+/5    Left Hip Flexion 5/5    Left Hip ABduction 4+/5    Right/Left Knee Right;Left    Right Knee Flexion 5/5    Right Knee Extension 5/5    Left Knee Flexion 5/5    Left Knee Extension 5/5    Right/Left Ankle Right;Left    Right Ankle Dorsiflexion 5/5    Left Ankle Dorsiflexion 5/5      Transfers   Transfers Sit to Stand;Stand to Sit    Sit to Stand 6: Modified independent (Device/Increase time)    Stand to Sit 6: Modified independent (Device/Increase time)    Comments 30 second chair stand: 8 sit <> stands, no UE support   below normal  for age is <8     Ambulation/Gait   Ambulation/Gait Yes    Ambulation/Gait Assistance 5: Supervision    Assistive device None    Gait Pattern Step-through pattern;Decreased arm swing - right;Decreased arm swing - left;Narrow base of support;Decreased trunk rotation   decr RLE foot clearance at times.   Ambulation Surface Level;Indoor    Gait velocity 11.47 seconds = 2.86 ft/sec    Stairs Yes    Stairs Assistance 5: Supervision    Stair Management Technique One rail Right;Alternating pattern;Forwards    Number of Stairs 4    Height of Stairs 6      High Level Balance   High Level Balance Comments mCTSIB: conditons 1-3 = 30 seconds, mild postural sway with condition 2. condition 4 = 2.19 seconds, needing min A from therapist for balance. SLS: ~1 second each side      Functional Gait  Assessment   Gait assessed  Yes    Gait Level Surface Walks 20 ft, slow speed, abnormal gait pattern, evidence for imbalance or deviates 10-15 in outside of the 12 in walkway width. Requires more than 7 sec to ambulate 20 ft.   7.06 seconds   Change in Gait Speed Able to smoothly change walking speed without loss of balance or gait deviation. Deviate no more than 6 in outside of the 12 in walkway width.    Gait with Horizontal Head Turns Performs head turns smoothly with slight change in gait velocity (eg, minor disruption to smooth gait path), deviates 6-10 in outside 12 in walkway width, or uses an assistive device.    Gait with Vertical Head Turns Performs head turns with no change in gait. Deviates no more than 6 in outside 12 in walkway width.    Gait and Pivot Turn Pivot turns safely within 3 sec and stops quickly with no loss of balance.    Step Over Obstacle Is able to step over 2 stacked shoe boxes taped together (9 in total height) without changing gait speed. No evidence of imbalance.    Gait with Narrow Base of Support Ambulates less than 4 steps heel to toe or cannot  perform without assistance.     Gait with Eyes Closed Walks 20 ft, slow speed, abnormal gait pattern, evidence for imbalance, deviates 10-15 in outside 12 in walkway width. Requires more than 9 sec to ambulate 20 ft.   22.09 seconds   Ambulating Backwards Walks 20 ft, uses assistive device, slower speed, mild gait deviations, deviates 6-10 in outside 12 in walkway width.   15 seconds   Steps Alternating feet, must use rail.    Total Score 20    FGA comment: 20/30 = moderate fall risk                        Objective measurements completed on examination: See above findings.                PT Education - 05/03/21 1002     Education Details Clinical findings, POC, purpose of 3 balance systems.    Person(s) Educated Patient    Methods Explanation    Comprehension Verbalized understanding              PT Short Term Goals - 05/03/21 1005       PT SHORT TERM GOAL #1   Title ALL STGS = LTGS               PT Long Term Goals - 05/03/21 1005       PT LONG TERM GOAL #1   Title Pt will be independent with final HEP in order to build upon functional gains made in therapy. ALL LTGS DUE 05/31/21    Time 4    Period Weeks    Status New    Target Date 05/31/21      PT LONG TERM GOAL #2   Title Pt will improve FGA to at least a 24/30 in order to demo decr fall risk.    Baseline 20/30    Time 4    Period Weeks    Status New      PT LONG TERM GOAL #3   Title Pt will improve 30 second chair stand to at least 10 sit <> stands with no UE support in order to demo improved functional strength/endurance.    Baseline 8 sit <> stands    Time 4    Period Weeks    Status New      PT LONG TERM GOAL #4   Title Pt will improve condition 4 of mCTSIB to at least 12 seconds in order to demo improved vestibular input for balance.    Baseline 2.19 seconds on 05/03/21    Time 4    Period Weeks    Status New      PT LONG TERM GOAL #5   Title Pt will improve gait speed to at least 3.2 ft/sec for  improved gait efficiency for community distances.    Baseline 2.86 ft/sec    Time 4    Period Weeks    Status New                    Plan - 05/03/21 1159     Clinical Impression Statement Patient is a 88 year old female referred to Neuro OPPT for gait abnormality/imbalance.   Pt's PMH is significant for: Glaucoma, osteoporosis, hypothyroidism. The following deficits were present during the exam: decr functional strength, impaired balance, gait abnormality. Based on FGA, pt is a moderate risk for falls. Pt challenged by SLS tasks and only able to hold condition  4 of mCTSIB for only 2.19 seconds, indicating decr vestibular input for balance.  Pt would benefit from skilled PT to address these impairments and functional limitations to maximize functional mobility independence and decr fall risk.    Personal Factors and Comorbidities Comorbidity 3+;Time since onset of injury/illness/exacerbation    Comorbidities PMH: Glaucoma, osteoporosis, hypothyroidism.    Examination-Activity Limitations Locomotion Level;Stairs    Examination-Participation Restrictions Community Activity    Stability/Clinical Decision Making Stable/Uncomplicated    Clinical Decision Making Low    Rehab Potential Good    PT Frequency 2x / week    PT Duration 8 weeks    PT Treatment/Interventions ADLs/Self Care Home Management;Gait training;Stair training;Therapeutic activities;Functional mobility training;Therapeutic exercise;Balance training;Neuromuscular re-education;Patient/family education;Vestibular    PT Next Visit Plan initial HEP - SLS, narrow BOS, balance on compliant surfaces, eyes closed and head motions. Work on Estate manager/land agent and Agree with Plan of Care Patient             Patient will benefit from skilled therapeutic intervention in order to improve the following deficits and impairments:  Abnormal gait, Decreased activity tolerance, Decreased balance, Decreased strength  Visit  Diagnosis: Unsteadiness on feet  Other abnormalities of gait and mobility     Problem List Patient Active Problem List   Diagnosis Date Noted   Incarcerated femoral hernia 09/02/2019   Femoral hernia of right side with obstruction 09/01/2019    Arliss Journey, PT, DPT  05/03/2021, 12:03 PM  [facility] [address] [place], [place], [address] Phone: [phone]   Fax:  [phone]  Name: CHARMANE PROTZMAN MRN: [id number] Date of Birth: April 14, 1933

## 2021-05-10 ENCOUNTER — Other Ambulatory Visit: Payer: Self-pay

## 2021-05-10 ENCOUNTER — Ambulatory Visit: Payer: Medicare PPO

## 2021-05-10 DIAGNOSIS — R2681 Unsteadiness on feet: Secondary | ICD-10-CM

## 2021-05-10 DIAGNOSIS — R2689 Other abnormalities of gait and mobility: Secondary | ICD-10-CM

## 2021-05-10 NOTE — Patient Instructions (Signed)
Access Code: [email or web address] URL: [email or web address] Date: 05/10/2021 Prepared by: Baldomero Lamy  Exercises Standing Balance with Eyes Closed on Foam - 1 x daily - 5 x weekly - 1 sets - 3 reps - 30 seconds hold Standing with Head Rotation on Pillow - 1 x daily - 5 x weekly - 2 sets - 10 reps Standing with Head Nod on Pillow - 1 x daily - 5 x weekly - 2 sets - 10 reps Tandem Stance in Corner - 1 x daily - 5 x weekly - 1 sets - 6 reps - 20-25 seconds hold Single Leg Stance with Support - 1 x daily - 5 x weekly - 1 sets - 3 reps - 10 seconds hold

## 2021-05-10 NOTE — Therapy (Signed)
[facility] [address] [place], [place], [address] Phone: [phone]   Fax:  [phone]  Physical Therapy Treatment  Patient Details  Name: Megan Molina MRN: [id number] Date of Birth: April 23, 1933 Referring Provider (PT): Dr. Maia Petties   Encounter Date: 05/10/2021   PT End of Session - 05/10/21 0801     Visit Number 2    Number of Visits 9    Date for PT Re-Evaluation 07/02/21    Authorization Type [employer] - Will need auth    PT Start Time 0801    PT Stop Time 0841    PT Time Calculation (min) 40 min    Equipment Utilized During Treatment Gait belt    Activity Tolerance Patient tolerated treatment well    Behavior During Therapy [facility] for tasks assessed/performed             Past Medical History:  Diagnosis Date   Glaucoma    Hypercholesteremia    Hypothyroid    Osteopenia 06/2016   T score -2.4 stable from prior DEXA.    Past Surgical History:  Procedure Laterality Date   ABCESS DRAINAGE     INTESTINAL ABCESS   APPENDECTOMY     BREAST BIOPSY Right    BREAST SURGERY     BIOPSY   cataract surgery     COLONOSCOPY  2013   INGUINAL HERNIA REPAIR Bilateral 09/02/2019   Procedure: LAPAROSCOPIC BILATERAL HERNIA REPAIR, BILATERAL FEMORAL AND LEFT INGUINAL;  Surgeon: Michael Boston, MD;  Location: [facility];  Service: General;  Laterality: Bilateral;    There were no vitals filed for this visit.   Subjective Assessment - 05/10/21 0803     Subjective Patient denies new changes/complaints. No falls to report. No pain to report.    Pertinent History PMH: Glaucoma, osteoporosis, hypothyroidism.    Limitations Walking    Patient Stated Goals Wants to see if she can walk better.    Currently in Pain? No/denies               [facility] Adult PT Treatment/Exercise - 05/10/21 0001       Ambulation/Gait   Ambulation/Gait Yes    Ambulation/Gait Assistance 5: Supervision    Assistive device None    Gait  Pattern Step-through pattern;Decreased arm swing - right;Decreased arm swing - left;Narrow base of support;Decreased trunk rotation    Ambulation Surface Level;Indoor               Balance Exercises - 05/10/21 0001       Balance Exercises: Standing   SLS with Vectors Foam/compliant surface;Solid surface;Limitations    SLS with Vectors Limitations standing on firm surface, completed alteranting toe taps to 1st step x 10 reps bilat w/o UE support, then progressed to toe taps on airex x 10 reps bilat, CGA and cues for control to promote SLS.    Tandem Gait Forward;4 reps;Limitations    Tandem Gait Limitations at countertop, completed tandem gait x 4 laps down and back. CGA, intermittent UE support    Other Standing Exercises completed forward ambulation with eyes closed x 4 laps, CGA. shortened step length noted.            Completed all of the following exercises during session and established HEP. Reviewed handout and addressed any questions/concerns. Also educated on purchase options for airex pad per patient request.    Access Code: [email or web address] URL: [email or web address] Date: 05/10/2021 Prepared by: Baldomero Lamy   Exercises Standing Balance with Eyes  Closed on Foam - 1 x daily - 5 x weekly - 1 sets - 3 reps - 30 seconds hold Standing with Head Rotation on Pillow - 1 x daily - 5 x weekly - 2 sets - 10 reps Standing with Head Nod on Pillow - 1 x daily - 5 x weekly - 2 sets - 10 reps Tandem Stance in Corner - 1 x daily - 5 x weekly - 1 sets - 6 reps - 20-25 seconds hold Single Leg Stance with Support - 1 x daily - 5 x weekly - 1 sets - 3 reps - 10 seconds hold    PT Education - 05/10/21 0804     Education Details initial HEP; purchase options for balance pad    Person(s) Educated Patient    Methods Explanation;Demonstration;Handout    Comprehension Verbalized understanding;Returned demonstration              PT Short Term Goals - 05/03/21 1005        PT SHORT TERM GOAL #1   Title ALL STGS = LTGS               PT Long Term Goals - 05/03/21 1005       PT LONG TERM GOAL #1   Title Pt will be independent with final HEP in order to build upon functional gains made in therapy. ALL LTGS DUE 05/31/21    Time 4    Period Weeks    Status New    Target Date 05/31/21      PT LONG TERM GOAL #2   Title Pt will improve FGA to at least a 24/30 in order to demo decr fall risk.    Baseline 20/30    Time 4    Period Weeks    Status New      PT LONG TERM GOAL #3   Title Pt will improve 30 second chair stand to at least 10 sit <> stands with no UE support in order to demo improved functional strength/endurance.    Baseline 8 sit <> stands    Time 4    Period Weeks    Status New      PT LONG TERM GOAL #4   Title Pt will improve condition 4 of mCTSIB to at least 12 seconds in order to demo improved vestibular input for balance.    Baseline 2.19 seconds on 05/03/21    Time 4    Period Weeks    Status New      PT LONG TERM GOAL #5   Title Pt will improve gait speed to at least 3.2 ft/sec for improved gait efficiency for community distances.    Baseline 2.86 ft/sec    Time 4    Period Weeks    Status New                   Plan - 05/10/21 1025     Clinical Impression Statement Today's session focused on establishing initial HEP, with focus on head turns, vision removed, and narrow BOS/SLS activities. Increased challenge with SLS on RLE > LLE noted. Intermittent CGA and UE support for SLS activities. Will continue to progress toward all LTGs.    Personal Factors and Comorbidities Comorbidity 3+;Time since onset of injury/illness/exacerbation    Comorbidities PMH: Glaucoma, osteoporosis, hypothyroidism.    Examination-Activity Limitations Locomotion Level;Stairs    Examination-Participation Restrictions Community Activity    Stability/Clinical Decision Making Stable/Uncomplicated    Rehab Potential Good  PT Frequency 2x /  week    PT Duration 8 weeks    PT Treatment/Interventions ADLs/Self Care Home Management;Gait training;Stair training;Therapeutic activities;Functional mobility training;Therapeutic exercise;Balance training;Neuromuscular re-education;Patient/family education;Vestibular    PT Next Visit Plan how was HEP? continue SLS, narrow BOS, balance on compliant surfaces, eyes closed and head motions. Work on Estate manager/land agent and Agree with Plan of Care Patient             Patient will benefit from skilled therapeutic intervention in order to improve the following deficits and impairments:  Abnormal gait, Decreased activity tolerance, Decreased balance, Decreased strength  Visit Diagnosis: Unsteadiness on feet  Other abnormalities of gait and mobility     Problem List Patient Active Problem List   Diagnosis Date Noted   Incarcerated femoral hernia 09/02/2019   Femoral hernia of right side with obstruction 09/01/2019    Jones Bales, PT, DPT 05/10/2021, 8:55 AM  [facility] [address] [place], [place], [address] Phone: [phone]   Fax:  [phone]  Name: Megan Molina MRN: [id number] Date of Birth: Nov 27, 1932

## 2021-05-14 ENCOUNTER — Other Ambulatory Visit: Payer: Self-pay

## 2021-05-14 ENCOUNTER — Ambulatory Visit: Payer: Medicare PPO

## 2021-05-14 DIAGNOSIS — R2689 Other abnormalities of gait and mobility: Secondary | ICD-10-CM

## 2021-05-14 DIAGNOSIS — R2681 Unsteadiness on feet: Secondary | ICD-10-CM

## 2021-05-14 NOTE — Therapy (Signed)
[facility] [address] [place], [place], [address] Phone: [phone]   Fax:  [phone]  Physical Therapy Treatment  Patient Details  Name: Megan Molina MRN: [id number] Date of Birth: 1933/03/18 Referring Provider (PT): Dr. Maia Petties   Encounter Date: 05/14/2021   PT End of Session - 05/14/21 0845     Visit Number 3    Number of Visits 9    Date for PT Re-Evaluation 07/02/21    Authorization Type [employer] - Will need auth    PT Start Time 0845    PT Stop Time 0926    PT Time Calculation (min) 41 min    Equipment Utilized During Treatment Gait belt    Activity Tolerance Patient tolerated treatment well    Behavior During Therapy [facility] for tasks assessed/performed             Past Medical History:  Diagnosis Date   Glaucoma    Hypercholesteremia    Hypothyroid    Osteopenia 06/2016   T score -2.4 stable from prior DEXA.    Past Surgical History:  Procedure Laterality Date   ABCESS DRAINAGE     INTESTINAL ABCESS   APPENDECTOMY     BREAST BIOPSY Right    BREAST SURGERY     BIOPSY   cataract surgery     COLONOSCOPY  2013   INGUINAL HERNIA REPAIR Bilateral 09/02/2019   Procedure: LAPAROSCOPIC BILATERAL HERNIA REPAIR, BILATERAL FEMORAL AND LEFT INGUINAL;  Surgeon: Michael Boston, MD;  Location: [facility];  Service: General;  Laterality: Bilateral;    There were no vitals filed for this visit.   Subjective Assessment - 05/14/21 0846     Subjective Patient reports feeling well today. Tried the exercises at home, reports no issues. No falls.    Pertinent History PMH: Glaucoma, osteoporosis, hypothyroidism.    Limitations Walking    Patient Stated Goals Wants to see if she can walk better.    Currently in Pain? No/denies                Balance Exercises - 05/14/21 0001       Balance Exercises: Standing   Standing Eyes Opened Narrow base of support (BOS);Head turns;Foam/compliant  surface;Limitations    Standing Eyes Opened Limitations narrow BOS with eyes open, horizontal/vertical head turns x 10 reps each.    Standing Eyes Closed Foam/compliant surface;3 reps;30 secs;Limitations    Standing Eyes Closed Limitations EC 3 x 30 seconds, bil stance    Tandem Stance Eyes open;Intermittent upper extremity support;3 reps;Time    Tandem Stance Limitations 20-30 seconds, alternating foot position.    SLS with Vectors Foam/compliant surface;Solid surface;Limitations    SLS with Vectors Limitations standing on foam surface, completed alteranting toe taps to cones x 10 reps bilat w/ intermittent UE support, then progressed to crossover toe taps on airex x 10 reps bilat, CGA and single UE support required.    Stepping Strategy Anterior;Posterior;Foam/compliant surface;Limitations    Stepping Strategy Limitations standing across blue balance: completed anterior/posterior stepping strategies x 10 reps bilat, intermittnet UE support. cues for step length, especially with LLE.    Rockerboard Anterior/posterior;Head turns;EO;Limitations    Rockerboard Limitations standing on rockerboard positioned A/P: completed standing EO holding board steady 2 x 30 seconds, then added in horizontal/vertical head turns x 10 reps. more challenge with vertical > horizonta, with intermittent increased posterior lean.    Tandem Gait Forward;Limitations;3 reps;Foam/compliant surface    Tandem Gait Limitations on blue balance  beam, use of light UE support, CGA x 3 laps.    Sidestepping Foam/compliant support;3 reps;Limitations    Sidestepping Limitations on blue balance beam, use of light UE support, CGA x 3 laps.                  PT Short Term Goals - 05/03/21 1005       PT SHORT TERM GOAL #1   Title ALL STGS = LTGS               PT Long Term Goals - 05/03/21 1005       PT LONG TERM GOAL #1   Title Pt will be independent with final HEP in order to build upon functional gains made in  therapy. ALL LTGS DUE 05/31/21    Time 4    Period Weeks    Status New    Target Date 05/31/21      PT LONG TERM GOAL #2   Title Pt will improve FGA to at least a 24/30 in order to demo decr fall risk.    Baseline 20/30    Time 4    Period Weeks    Status New      PT LONG TERM GOAL #3   Title Pt will improve 30 second chair stand to at least 10 sit <> stands with no UE support in order to demo improved functional strength/endurance.    Baseline 8 sit <> stands    Time 4    Period Weeks    Status New      PT LONG TERM GOAL #4   Title Pt will improve condition 4 of mCTSIB to at least 12 seconds in order to demo improved vestibular input for balance.    Baseline 2.19 seconds on 05/03/21    Time 4    Period Weeks    Status New      PT LONG TERM GOAL #5   Title Pt will improve gait speed to at least 3.2 ft/sec for improved gait efficiency for community distances.    Baseline 2.86 ft/sec    Time 4    Period Weeks    Status New                   Plan - 05/14/21 0845     Clinical Impression Statement Today's skilled PT session focused on continued review of HEP and progression of balance activities during session. Patient tolerating well, most notable challenge with vision removed on complaint surfaces, and vertical > horizontal head turns. Will continue per POC.    Personal Factors and Comorbidities Comorbidity 3+;Time since onset of injury/illness/exacerbation    Comorbidities PMH: Glaucoma, osteoporosis, hypothyroidism.    Examination-Activity Limitations Locomotion Level;Stairs    Examination-Participation Restrictions Community Activity    Stability/Clinical Decision Making Stable/Uncomplicated    Rehab Potential Good    PT Frequency 2x / week    PT Duration 8 weeks    PT Treatment/Interventions ADLs/Self Care Home Management;Gait training;Stair training;Therapeutic activities;Functional mobility training;Therapeutic exercise;Balance training;Neuromuscular  re-education;Patient/family education;Vestibular    PT Next Visit Plan continue SLS, narrow BOS, balance on compliant surfaces, eyes closed and head motions. Work on TEFL teacher with Plan of Care Patient             Patient will benefit from skilled therapeutic intervention in order to improve the following deficits and impairments:  Abnormal gait, Decreased activity tolerance, Decreased balance, Decreased strength  Visit Diagnosis:  Unsteadiness on feet  Other abnormalities of gait and mobility     Problem List Patient Active Problem List   Diagnosis Date Noted   Incarcerated femoral hernia 09/02/2019   Femoral hernia of right side with obstruction 09/01/2019    Jones Bales, PT, DPT 05/14/2021, 9:27 AM  [facility] [address] [place], [place], [address] Phone: [phone]   Fax:  [phone]  Name: Megan Molina MRN: [id number] Date of Birth: 06/11/1932

## 2021-05-17 ENCOUNTER — Ambulatory Visit: Payer: Medicare PPO

## 2021-05-20 ENCOUNTER — Ambulatory Visit: Payer: Medicare PPO

## 2021-05-20 ENCOUNTER — Other Ambulatory Visit: Payer: Self-pay

## 2021-05-20 DIAGNOSIS — R2689 Other abnormalities of gait and mobility: Secondary | ICD-10-CM

## 2021-05-20 DIAGNOSIS — R2681 Unsteadiness on feet: Secondary | ICD-10-CM | POA: Diagnosis not present

## 2021-05-20 NOTE — Therapy (Signed)
[facility] [address] [place], [place], [address] Phone: [phone]   Fax:  [phone]  Physical Therapy Treatment  Patient Details  Name: Megan Molina MRN: [id number] Date of Birth: April 11, 1933 Referring Provider (PT): Dr. Maia Petties   Encounter Date: 05/20/2021   PT End of Session - 05/20/21 0936     Visit Number 4    Number of Visits 9    Date for PT Re-Evaluation 07/02/21    Authorization Type [employer] - Will need auth    PT Start Time 0932    PT Stop Time 1012    PT Time Calculation (min) 40 min    Equipment Utilized During Treatment Gait belt    Activity Tolerance Patient tolerated treatment well    Behavior During Therapy [facility] for tasks assessed/performed             Past Medical History:  Diagnosis Date   Glaucoma    Hypercholesteremia    Hypothyroid    Osteopenia 06/2016   T score -2.4 stable from prior DEXA.    Past Surgical History:  Procedure Laterality Date   ABCESS DRAINAGE     INTESTINAL ABCESS   APPENDECTOMY     BREAST BIOPSY Right    BREAST SURGERY     BIOPSY   cataract surgery     COLONOSCOPY  2013   INGUINAL HERNIA REPAIR Bilateral 09/02/2019   Procedure: LAPAROSCOPIC BILATERAL HERNIA REPAIR, BILATERAL FEMORAL AND LEFT INGUINAL;  Surgeon: Michael Boston, MD;  Location: [facility];  Service: General;  Laterality: Bilateral;    There were no vitals filed for this visit.   Subjective Assessment - 05/20/21 0935     Subjective Missed earlier this week due to HA. No other new changes/complaints. No falls.    Pertinent History PMH: Glaucoma, osteoporosis, hypothyroidism.    Limitations Walking    Patient Stated Goals Wants to see if she can walk better.    Currently in Pain? No/denies                 [facility] Adult PT Treatment/Exercise - 05/20/21 0001       Transfers   Transfers Sit to Stand;Stand to Sit    Sit to Stand 6: Modified independent (Device/Increase time)     Stand to Sit 6: Modified independent (Device/Increase time)    Number of Reps 10 reps;1 set   completed with BLE placed on airex, x 10 reps without UE support     Ambulation/Gait   Ambulation/Gait Yes    Ambulation/Gait Assistance 5: Supervision    Assistive device None    Gait Pattern Step-through pattern;Decreased arm swing - right;Decreased arm swing - left;Narrow base of support;Decreased trunk rotation    Ambulation Surface Level;Indoor      High Level Balance   High Level Balance Activities Head turns;Backward walking;Marching forwards;Other (comment)   Eyes Closed   High Level Balance Comments Completed x 3 laps down and back in hallway of each of the following: head turns/nods, gait with eyes closed, marching forwards, and backwards walking. Most challenge noted with backwards walking and eyes closed. CGA for balance.                Balance Exercises - 05/20/21 0001       Balance Exercises: Standing   Standing Eyes Closed Foam/compliant surface;3 reps;30 secs;Limitations    Standing Eyes Closed Limitations EC 3 x 30 seconds, narrow BOS. Then added in 2x 10 reps of horizontal/vertical head  turns.    Tandem Stance Eyes open;Intermittent upper extremity support;2 reps;30 secs;Limitations    Tandem Stance Time 25-30 seconds    Tandem Stance Limitations alteranting BLE. increased challenge with LLE posterior.    SLS Eyes open;Solid surface;3 reps;Time    SLS Time 10-15 seconds, with increased challenge on LLE > RLE.    Tandem Gait Forward;Limitations;3 reps;Foam/compliant surface    Tandem Gait Limitations without UE support on firm surface 2 x 10-15'           Updated HEP:  Access Code: [email or web address] URL: [email or web address] Date: 05/20/2021 Prepared by: Baldomero Lamy  Exercises Standing Balance with Eyes Closed on Foam - 1 x daily - 5 x weekly - 1 sets - 3 reps - 30 seconds hold Standing with Head Rotation on Pillow - 1 x daily - 5 x weekly - 2 sets -  10 reps Standing with Head Nod on Pillow - 1 x daily - 5 x weekly - 2 sets - 10 reps Tandem Stance in Corner - 1 x daily - 5 x weekly - 1 sets - 6 reps - 20-25 seconds hold Single Leg Stance with Support - 1 x daily - 5 x weekly - 1 sets - 3 reps - 10 seconds hold Walking with Head Rotation with Countertop Support - 1 x daily - 5 x weekly - 1 sets - 3-4 reps Walking March with Countertop Support and Resistance - 1 x daily - 5 x weekly - 1 sets - 3-4 reps      PT Education - 05/20/21 1010     Education Details HEP Update    Person(s) Educated Patient    Methods Explanation    Comprehension Verbalized understanding              PT Short Term Goals - 05/03/21 1005       PT SHORT TERM GOAL #1   Title ALL STGS = LTGS               PT Long Term Goals - 05/03/21 1005       PT LONG TERM GOAL #1   Title Pt will be independent with final HEP in order to build upon functional gains made in therapy. ALL LTGS DUE 05/31/21    Time 4    Period Weeks    Status New    Target Date 05/31/21      PT LONG TERM GOAL #2   Title Pt will improve FGA to at least a 24/30 in order to demo decr fall risk.    Baseline 20/30    Time 4    Period Weeks    Status New      PT LONG TERM GOAL #3   Title Pt will improve 30 second chair stand to at least 10 sit <> stands with no UE support in order to demo improved functional strength/endurance.    Baseline 8 sit <> stands    Time 4    Period Weeks    Status New      PT LONG TERM GOAL #4   Title Pt will improve condition 4 of mCTSIB to at least 12 seconds in order to demo improved vestibular input for balance.    Baseline 2.19 seconds on 05/03/21    Time 4    Period Weeks    Status New      PT LONG TERM GOAL #5   Title Pt will improve gait speed to at least 3.2 ft/sec for improved  gait efficiency for community distances.    Baseline 2.86 ft/sec    Time 4    Period Weeks    Status New                   Plan - 05/20/21 1011      Clinical Impression Statement Continued high level balance and activities on complaint surfaces. Continue to demo increased challenge with SLS activities especially on LLE. Updated HEP and progressed to patient's tolerance. Will continue to progress toward all LTGs.    Personal Factors and Comorbidities Comorbidity 3+;Time since onset of injury/illness/exacerbation    Comorbidities PMH: Glaucoma, osteoporosis, hypothyroidism.    Examination-Activity Limitations Locomotion Level;Stairs    Examination-Participation Restrictions Community Activity    Stability/Clinical Decision Making Stable/Uncomplicated    Rehab Potential Good    PT Frequency 2x / week    PT Duration 8 weeks    PT Treatment/Interventions ADLs/Self Care Home Management;Gait training;Stair training;Therapeutic activities;Functional mobility training;Therapeutic exercise;Balance training;Neuromuscular re-education;Patient/family education;Vestibular    PT Next Visit Plan How was new additions to HEP? continue SLS, narrow BOS, balance on compliant surfaces, eyes closed and head motions. Work on Estate manager/land agent and Agree with Plan of Care Patient             Patient will benefit from skilled therapeutic intervention in order to improve the following deficits and impairments:  Abnormal gait, Decreased activity tolerance, Decreased balance, Decreased strength  Visit Diagnosis: Unsteadiness on feet  Other abnormalities of gait and mobility     Problem List Patient Active Problem List   Diagnosis Date Noted   Incarcerated femoral hernia 09/02/2019   Femoral hernia of right side with obstruction 09/01/2019    Jones Bales, PT, DPT 05/20/2021, 10:12 AM  [facility] [address] [place], [place], [address] Phone: [phone]   Fax:  [phone]  Name: KIALEE KHAM MRN: [id number] Date of Birth: Sep 23, 1932

## 2021-05-20 NOTE — Patient Instructions (Signed)
Access Code: [email or web address] URL: [email or web address] Date: 05/20/2021 Prepared by: Baldomero Lamy  Exercises Standing Balance with Eyes Closed on Foam - 1 x daily - 5 x weekly - 1 sets - 3 reps - 30 seconds hold Standing with Head Rotation on Pillow - 1 x daily - 5 x weekly - 2 sets - 10 reps Standing with Head Nod on Pillow - 1 x daily - 5 x weekly - 2 sets - 10 reps Tandem Stance in Corner - 1 x daily - 5 x weekly - 1 sets - 6 reps - 20-25 seconds hold Single Leg Stance with Support - 1 x daily - 5 x weekly - 1 sets - 3 reps - 10 seconds hold Walking with Head Rotation with Countertop Support - 1 x daily - 5 x weekly - 1 sets - 3-4 reps Walking March with Countertop Support and Resistance - 1 x daily - 5 x weekly - 1 sets - 3-4 reps

## 2021-05-24 ENCOUNTER — Ambulatory Visit: Payer: Medicare PPO | Admitting: Physical Therapy

## 2021-05-24 ENCOUNTER — Other Ambulatory Visit: Payer: Self-pay

## 2021-05-24 ENCOUNTER — Encounter: Payer: Self-pay | Admitting: Physical Therapy

## 2021-05-24 DIAGNOSIS — R2681 Unsteadiness on feet: Secondary | ICD-10-CM | POA: Diagnosis not present

## 2021-05-24 DIAGNOSIS — R2689 Other abnormalities of gait and mobility: Secondary | ICD-10-CM

## 2021-05-24 NOTE — Therapy (Addendum)
[facility] [address] [place], [place], [address] Phone: [phone]   Fax:  [phone]  Physical Therapy Treatment  Patient Details  Name: Megan Molina MRN: [id number] Date of Birth: 12/24/1932 Referring Provider (PT): Dr. Maia Petties   Encounter Date: 05/24/2021   PT End of Session - 05/24/21 0849     Visit Number 5    Number of Visits 9    Date for PT Re-Evaluation 07/02/21    Authorization Type [employer] - Will need auth    PT Start Time 0848    PT Stop Time 0928    PT Time Calculation (min) 40 min    Equipment Utilized During Treatment Gait belt    Activity Tolerance Patient tolerated treatment well    Behavior During Therapy [facility] for tasks assessed/performed             Past Medical History:  Diagnosis Date   Glaucoma    Hypercholesteremia    Hypothyroid    Osteopenia 06/2016   T score -2.4 stable from prior DEXA.    Past Surgical History:  Procedure Laterality Date   ABCESS DRAINAGE     INTESTINAL ABCESS   APPENDECTOMY     BREAST BIOPSY Right    BREAST SURGERY     BIOPSY   cataract surgery     COLONOSCOPY  2013   INGUINAL HERNIA REPAIR Bilateral 09/02/2019   Procedure: LAPAROSCOPIC BILATERAL HERNIA REPAIR, BILATERAL FEMORAL AND LEFT INGUINAL;  Surgeon: Michael Boston, MD;  Location: [facility];  Service: General;  Laterality: Bilateral;    There were no vitals filed for this visit.   Subjective Assessment - 05/24/21 0850     Subjective New exercises are going well. Standing on one leg is still difficult.    Pertinent History PMH: Glaucoma, osteoporosis, hypothyroidism.    Limitations Walking    Patient Stated Goals Wants to see if she can walk better.    Currently in Pain? No/denies                                    Balance Exercises - 05/24/21 0850       Balance Exercises: Standing   Standing Eyes Closed Foam/compliant surface;3 reps;30 secs;Limitations    intermittent UE support   Standing Eyes Closed Limitations Wider BOS 2 x 30 seconds, feet hip width distance 2 x 30 seconds - intermittent taps to bars for balance.    SLS Eyes open;Solid surface;Limitations    SLS Time Semi circle taps to colorful floor bubbles x10 reps each leg, min guard, needs to step back intermittently to middle for balance.    Stepping Strategy Anterior;Posterior;10 reps;Limitations    Stepping Strategy Limitations x10 reps alternating each side, performed on rockerboard in A/P direction    Rockerboard Lateral;Limitations;EO;Intermittent UE support    Rockerboard Limitations M/L direction: x15 reps weight shifting each side, 2 x 10 reps head turns, 2 x 10 reps head nods    Tandem Gait Forward;Intermittent upper extremity support    Tandem Gait Limitations Down and back x4 reps with blue mat    Step Over Hurdles / Cones On blue mat, stepping over 3 smaller orange obstacles with step to pattern, x3 reps with RLE as stance leg and then repeating with LLE as stance leg.    Marching Intermittent upper extremity assist;Forwards;Dynamic;Limitations    Marching Limitations On blue mat, forwards marching down and  back x3 reps, cues for 3 second hold for SLS                  PT Short Term Goals - 05/03/21 1005       PT SHORT TERM GOAL #1   Title ALL STGS = LTGS               PT Long Term Goals - 05/03/21 1005       PT LONG TERM GOAL #1   Title Pt will be independent with final HEP in order to build upon functional gains made in therapy. ALL LTGS DUE 05/31/21    Time 4    Period Weeks    Status New    Target Date 05/31/21      PT LONG TERM GOAL #2   Title Pt will improve FGA to at least a 24/30 in order to demo decr fall risk.    Baseline 20/30    Time 4    Period Weeks    Status New      PT LONG TERM GOAL #3   Title Pt will improve 30 second chair stand to at least 10 sit <> stands with no UE support in order to demo improved functional  strength/endurance.    Baseline 8 sit <> stands    Time 4    Period Weeks    Status New      PT LONG TERM GOAL #4   Title Pt will improve condition 4 of mCTSIB to at least 12 seconds in order to demo improved vestibular input for balance.    Baseline 2.19 seconds on 05/03/21    Time 4    Period Weeks    Status New      PT LONG TERM GOAL #5   Title Pt will improve gait speed to at least 3.2 ft/sec for improved gait efficiency for community distances.    Baseline 2.86 ft/sec    Time 4    Period Weeks    Status New                   Plan - 05/24/21 1040     Clinical Impression Statement Today's skilled session continued to focus on high level balance with compliant surfaces and SLS tasks. Pt needing intermittent UE support with balance with eyes closed on foam. Pt continues to be challenged with SLS tasks with LLE>RLE. Will continue to progress towards LTGs.    Personal Factors and Comorbidities Comorbidity 3+;Time since onset of injury/illness/exacerbation    Comorbidities PMH: Glaucoma, osteoporosis, hypothyroidism.    Examination-Activity Limitations Locomotion Level;Stairs    Examination-Participation Restrictions Community Activity    Stability/Clinical Decision Making Stable/Uncomplicated    Rehab Potential Good    PT Frequency 2x / week    PT Duration 8 weeks    PT Treatment/Interventions ADLs/Self Care Home Management;Gait training;Stair training;Therapeutic activities;Functional mobility training;Therapeutic exercise;Balance training;Neuromuscular re-education;Patient/family education;Vestibular    PT Next Visit Plan continue SLS, narrow BOS, balance on compliant surfaces, eyes closed and head motions. Work on high level balance. goals due next week.    Consulted and Agree with Plan of Care Patient             Patient will benefit from skilled therapeutic intervention in order to improve the following deficits and impairments:  Abnormal gait, Decreased activity  tolerance, Decreased balance, Decreased strength  Visit Diagnosis: Unsteadiness on feet  Other abnormalities of gait and mobility     Problem List  Patient Active Problem List   Diagnosis Date Noted   Incarcerated femoral hernia 09/02/2019   Femoral hernia of right side with obstruction 09/01/2019    Arliss Journey, PT, DPT  05/24/2021, 10:41 AM  [facility] [address] [place], [place], [address] Phone: [phone]   Fax:  [phone]  Name: CHANA LINDSTROM MRN: [id number] Date of Birth: 1932/06/09

## 2021-05-27 ENCOUNTER — Encounter: Payer: Self-pay | Admitting: Physical Therapy

## 2021-05-27 ENCOUNTER — Ambulatory Visit: Payer: Medicare PPO | Attending: Internal Medicine | Admitting: Physical Therapy

## 2021-05-27 ENCOUNTER — Other Ambulatory Visit: Payer: Self-pay

## 2021-05-27 DIAGNOSIS — R2689 Other abnormalities of gait and mobility: Secondary | ICD-10-CM | POA: Diagnosis present

## 2021-05-27 DIAGNOSIS — R2681 Unsteadiness on feet: Secondary | ICD-10-CM | POA: Diagnosis not present

## 2021-05-27 NOTE — Therapy (Addendum)
[facility] [address] [place], [place], [address] Phone: [phone]   Fax:  [phone]  Physical Therapy Treatment  Patient Details  Name: Megan Molina MRN: [id number] Date of Birth: 24-Apr-1933 Referring Provider (PT): Dr. Maia Petties   Encounter Date: 05/27/2021   PT End of Session - 05/27/21 0936     Visit Number 6    Number of Visits 9    Date for PT Re-Evaluation 07/02/21    Authorization Type [employer] - Will need auth    PT Start Time 0934    PT Stop Time 1013    PT Time Calculation (min) 39 min    Equipment Utilized During Treatment Gait belt    Activity Tolerance Patient tolerated treatment well    Behavior During Therapy [facility] for tasks assessed/performed             Past Medical History:  Diagnosis Date   Glaucoma    Hypercholesteremia    Hypothyroid    Osteopenia 06/2016   T score -2.4 stable from prior DEXA.    Past Surgical History:  Procedure Laterality Date   ABCESS DRAINAGE     INTESTINAL ABCESS   APPENDECTOMY     BREAST BIOPSY Right    BREAST SURGERY     BIOPSY   cataract surgery     COLONOSCOPY  2013   INGUINAL HERNIA REPAIR Bilateral 09/02/2019   Procedure: LAPAROSCOPIC BILATERAL HERNIA REPAIR, BILATERAL FEMORAL AND LEFT INGUINAL;  Surgeon: Michael Boston, MD;  Location: [facility];  Service: General;  Laterality: Bilateral;    There were no vitals filed for this visit.   Subjective Assessment - 05/27/21 0936     Subjective Standing on one leg is still difficult.    Pertinent History PMH: Glaucoma, osteoporosis, hypothyroidism.    Limitations Walking    Patient Stated Goals Wants to see if she can walk better.    Currently in Pain? No/denies                               [facility] Adult PT Treatment/Exercise - 05/27/21 0948       High Level Balance   High Level Balance Activities Negotiating over obstacles    High Level Balance Comments Over approx 30' -  ambulating over Over 2 smaller and 2 larger orange obstacles and 3 cones (performing alternating SLS taps and then stepping over), down and back x2 reps, with min guard at times for balance with SLS on LLE. Cued to maintain gait speed between stepping over obstacles vs. Slowing down.                 Balance Exercises - 05/27/21 0936       Balance Exercises: Standing   Standing Eyes Opened Narrow base of support (BOS);Head turns;Foam/compliant surface;Limitations    Standing Eyes Opened Limitations on air ex; 2 x 10 reps head turns, 2 x 10 reps head nods    Standing Eyes Closed Foam/compliant surface    Standing Eyes Closed Limitations Feet hip width distance 2 x 30 seconds, slightly narrower x30 seconds    Tandem Stance Eyes open;Intermittent upper extremity support;Upper extremity support 1    Tandem Stance Time Holding tandem stance on blue foam beam - stepping non stance leg forwards and back x10 reps each leg    SLS with Vectors Foam/compliant surface;Limitations    SLS with Vectors Limitations At bottom of staircase -alternating  SLS taps to 6" step, then to 12" step x10 reps each leg, performed with no UE support    Stepping Strategy Anterior    Stepping Strategy Limitations alternating off red beam, initially min guard for balance    Step Ups 6 inch;Intermittent UE support;Limitations    Step Ups Limitations x10 reps total, alternating legs, trying to float non-stance leg, incr difficulty with LLE    Sit to Stand Without upper extremity support;Foam/compliant surface;Limitations    Sit to Stand Limitations x10 reps on red beam, initial min guard for balance.    Other Standing Exercises Wide BOS on blue foam beam lateral weight shifting and adding in reaching to countertop and looking up at hand, x10 reps each side                  PT Short Term Goals - 05/03/21 1005       PT SHORT TERM GOAL #1   Title ALL STGS = LTGS               PT Long Term Goals -  05/03/21 1005       PT LONG TERM GOAL #1   Title Pt will be independent with final HEP in order to build upon functional gains made in therapy. ALL LTGS DUE 05/31/21    Time 4    Period Weeks    Status New    Target Date 05/31/21      PT LONG TERM GOAL #2   Title Pt will improve FGA to at least a 24/30 in order to demo decr fall risk.    Baseline 20/30    Time 4    Period Weeks    Status New      PT LONG TERM GOAL #3   Title Pt will improve 30 second chair stand to at least 10 sit <> stands with no UE support in order to demo improved functional strength/endurance.    Baseline 8 sit <> stands    Time 4    Period Weeks    Status New      PT LONG TERM GOAL #4   Title Pt will improve condition 4 of mCTSIB to at least 12 seconds in order to demo improved vestibular input for balance.    Baseline 2.19 seconds on 05/03/21    Time 4    Period Weeks    Status New      PT LONG TERM GOAL #5   Title Pt will improve gait speed to at least 3.2 ft/sec for improved gait efficiency for community distances.    Baseline 2.86 ft/sec    Time 4    Period Weeks    Status New                   Plan - 05/27/21 1449     Clinical Impression Statement Today's skilled session focused on dynamic gait with obstacle negotiation, SLS tasks, and balance on compliant surfaces with incr vestibular input for balance. Pt continues to be challenged with balance with eyes closed with incr postural sway with feet hip width distance. Will continue to progress towards LTGs.    Personal Factors and Comorbidities Comorbidity 3+;Time since onset of injury/illness/exacerbation    Comorbidities PMH: Glaucoma, osteoporosis, hypothyroidism.    Examination-Activity Limitations Locomotion Level;Stairs    Examination-Participation Restrictions Community Activity    Stability/Clinical Decision Making Stable/Uncomplicated    Rehab Potential Good    PT Frequency 2x / week  PT Duration 8 weeks    PT  Treatment/Interventions ADLs/Self Care Home Management;Gait training;Stair training;Therapeutic activities;Functional mobility training;Therapeutic exercise;Balance training;Neuromuscular re-education;Patient/family education;Vestibular    PT Next Visit Plan check LTGs -deterine D/C or continue with POC (can extend/revise goals instead of recert as needed, POC written for 8 weeks) continue SLS, narrow BOS, balance on compliant surfaces, eyes closed and head motions. Work on high level balance.    Consulted and Agree with Plan of Care Patient             Patient will benefit from skilled therapeutic intervention in order to improve the following deficits and impairments:  Abnormal gait, Decreased activity tolerance, Decreased balance, Decreased strength  Visit Diagnosis: Unsteadiness on feet  Other abnormalities of gait and mobility     Problem List Patient Active Problem List   Diagnosis Date Noted   Incarcerated femoral hernia 09/02/2019   Femoral hernia of right side with obstruction 09/01/2019    Arliss Journey, PT, DPT  05/27/2021, 2:52 PM  [facility] [address] [place], [place], [address] Phone: [phone]   Fax:  [phone]  Name: AIJA SCARFO MRN: [id number] Date of Birth: Jan 16, 1933

## 2021-05-31 ENCOUNTER — Ambulatory Visit: Payer: Medicare PPO

## 2021-05-31 ENCOUNTER — Other Ambulatory Visit: Payer: Self-pay

## 2021-05-31 DIAGNOSIS — R2689 Other abnormalities of gait and mobility: Secondary | ICD-10-CM

## 2021-05-31 DIAGNOSIS — R2681 Unsteadiness on feet: Secondary | ICD-10-CM | POA: Diagnosis not present

## 2021-05-31 NOTE — Patient Instructions (Signed)
Access Code: [email or web address] URL: [email or web address] Date: 05/31/2021 Prepared by: Baldomero Lamy  Program Notes Walking with Eyes Closed - At countertop with light support, walk forwards with eyes closed approx 10 ft. Completed 3-4 reps. Progression: reduce amount of support needed from countertop.    Exercises Tandem Stance in Corner - 1 x daily - 5 x weekly - 1 sets - 6 reps - 20-25 seconds hold Single Leg Stance with Support - 1 x daily - 5 x weekly - 1 sets - 3 reps - 10 seconds hold Walking with Head Rotation with Countertop Support - 1 x daily - 5 x weekly - 1 sets - 3-4 reps Walking March with Countertop Support and Resistance - 1 x daily - 5 x weekly - 1 sets - 3-4 reps Romberg Stance Eyes Closed on Foam Pad - 1 x daily - 5 x weekly - 1 sets - 3 reps - 30 seconds hold Romberg Stance on Foam Pad with Head Rotation - 1 x daily - 5 x weekly - 2 sets - 10 reps Romberg Stance with Head Nods on Foam Pad - 1 x daily - 5 x weekly - 2 sets - 10 reps Tandem Walking with Counter Support - 1 x daily - 5 x weekly - 1 sets - 3-4 reps

## 2021-05-31 NOTE — Therapy (Signed)
[facility] [address] [place], [place], [address] Phone: [phone]   Fax:  [phone]  Physical Therapy Treatment/Discharge Summary  Patient Details  Name: Megan Molina MRN: [id number] Date of Birth: [birth year] Referring Provider (PT): Dr. Maia Petties  PHYSICAL THERAPY DISCHARGE SUMMARY  Visits from Start of Care: 7  Current functional level related to goals / functional outcomes: See Clinical Impression Statement   Remaining deficits: Mild Imbalance   Education / Equipment: HEP   Patient agrees to discharge. Patient goals were met. Patient is being discharged due to meeting the stated rehab goals.  Encounter Date: 05/31/2021   PT End of Session - 05/31/21 0848     Visit Number 7    Number of Visits 9    Date for PT Re-Evaluation 07/02/21    Authorization Type [employer] - Will need auth    PT Start Time 0846    PT Stop Time 0925    PT Time Calculation (min) 39 min    Equipment Utilized During Treatment Gait belt    Activity Tolerance Patient tolerated treatment well    Behavior During Therapy WFL for tasks assessed/performed             Past Medical History:  Diagnosis Date   Glaucoma    Hypercholesteremia    Hypothyroid    Osteopenia 06/2016   T score -2.4 stable from prior DEXA.    Past Surgical History:  Procedure Laterality Date   ABCESS DRAINAGE     INTESTINAL ABCESS   APPENDECTOMY     BREAST BIOPSY Right    BREAST SURGERY     BIOPSY   cataract surgery     COLONOSCOPY  2013   INGUINAL HERNIA REPAIR Bilateral 09/02/2019   Procedure: LAPAROSCOPIC BILATERAL HERNIA REPAIR, BILATERAL FEMORAL AND LEFT INGUINAL;  Surgeon: Michael Boston, MD;  Location: [facility];  Service: General;  Laterality: Bilateral;    There were no vitals filed for this visit.   Subjective Assessment - 05/31/21 0847     Subjective No new changes/complaints. No falls. Reports exercises are going well, feel like  they are getting better.    Pertinent History PMH: Glaucoma, osteoporosis, hypothyroidism.    Limitations Walking    Patient Stated Goals Wants to see if she can walk better.    Currently in Pain? No/denies                [facility] PT Assessment - 05/31/21 0001       High Level Balance   High Level Balance Comments mCTSIB: conditons 1-3 = 30 seconds, condition 4 = 12 seconds      Functional Gait  Assessment   Gait assessed  Yes    Gait Level Surface Walks 20 ft in less than 7 sec but greater than 5.5 sec, uses assistive device, slower speed, mild gait deviations, or deviates 6-10 in outside of the 12 in walkway width.    Change in Gait Speed Able to smoothly change walking speed without loss of balance or gait deviation. Deviate no more than 6 in outside of the 12 in walkway width.    Gait with Horizontal Head Turns Performs head turns smoothly with no change in gait. Deviates no more than 6 in outside 12 in walkway width    Gait with Vertical Head Turns Performs head turns with no change in gait. Deviates no more than 6 in outside 12 in walkway width.    Gait and Pivot Turn Pivot  turns safely within 3 sec and stops quickly with no loss of balance.    Step Over Obstacle Is able to step over 2 stacked shoe boxes taped together (9 in total height) without changing gait speed. No evidence of imbalance.    Gait with Narrow Base of Support Ambulates 4-7 steps.    Gait with Eyes Closed Walks 20 ft, slow speed, abnormal gait pattern, evidence for imbalance, deviates 10-15 in outside 12 in walkway width. Requires more than 9 sec to ambulate 20 ft.    Ambulating Backwards Walks 20 ft, uses assistive device, slower speed, mild gait deviations, deviates 6-10 in outside 12 in walkway width.    Steps Alternating feet, no rail.    Total Score 24    FGA comment: 24/30               [facility] Adult PT Treatment/Exercise - 05/31/21 0001       Transfers   Transfers Sit to Stand;Stand to Sit    Sit  to Stand 6: Modified independent (Device/Increase time)    Stand to Sit 6: Modified independent (Device/Increase time)    Comments 30 second chair stand: 11 sit <> stands, no UE support      Ambulation/Gait   Ambulation/Gait Yes    Ambulation/Gait Assistance 5: Supervision    Assistive device None    Gait Pattern Step-through pattern;Decreased arm swing - right;Decreased arm swing - left;Narrow base of support;Decreased trunk rotation    Ambulation Surface Level;Indoor    Gait velocity 9.90 seconds = 3.31 ft/sec             Completed entire review and update of HEP and progressed to patient's tolerance:  Access Code: [email or web address] URL: [email or web address] Date: 05/31/2021 Prepared by: Baldomero Lamy  Program Notes Walking with Eyes Closed - At countertop with light support, walk forwards with eyes closed approx 10 ft. Completed 3-4 reps. Progression: reduce amount of support needed from countertop.    Exercises Tandem Stance in Corner - 1 x daily - 5 x weekly - 1 sets - 6 reps - 20-25 seconds hold Single Leg Stance with Support - 1 x daily - 5 x weekly - 1 sets - 3 reps - 10 seconds hold Walking with Head Rotation with Countertop Support - 1 x daily - 5 x weekly - 1 sets - 3-4 reps Walking March with Countertop Support and Resistance - 1 x daily - 5 x weekly - 1 sets - 3-4 reps Romberg Stance Eyes Closed on Foam Pad - 1 x daily - 5 x weekly - 1 sets - 3 reps - 30 seconds hold Romberg Stance on Foam Pad with Head Rotation - 1 x daily - 5 x weekly - 2 sets - 10 reps Romberg Stance with Head Nods on Foam Pad - 1 x daily - 5 x weekly - 2 sets - 10 reps Tandem Walking with Counter Support - 1 x daily - 5 x weekly - 1 sets - 3-4 reps          PT Education - 05/31/21 0843     Education Details Progress toward LTGs; Updated HEP    Person(s) Educated Patient    Methods Explanation;Demonstration;Handout    Comprehension Verbalized understanding;Returned  demonstration              PT Short Term Goals - 05/03/21 1005       PT SHORT TERM GOAL #1   Title ALL STGS = LTGS  PT Long Term Goals - 05/31/21 0849       PT LONG TERM GOAL #1   Title Pt will be independent with final HEP in order to build upon functional gains made in therapy. ALL LTGS DUE 05/31/21    Baseline reports independence with HEP    Time 4    Period Weeks    Status New    Target Date 05/31/21      PT LONG TERM GOAL #2   Title Pt will improve FGA to at least a 24/30 in order to demo decr fall risk.    Baseline 20/30; 24/30    Time 4    Period Weeks    Status Achieved      PT LONG TERM GOAL #3   Title Pt will improve 30 second chair stand to at least 10 sit <> stands with no UE support in order to demo improved functional strength/endurance.    Baseline 8 sit <> stands; 11 sit <> stands, no UE support    Time 4    Period Weeks    Status Achieved      PT LONG TERM GOAL #4   Title Pt will improve condition 4 of mCTSIB to at least 13 seconds in order to demo improved vestibular input for balance.    Baseline 2.19 seconds on 05/03/21; 12 seconds    Time 4    Period Weeks    Status Partially Met      PT LONG TERM GOAL #5   Title Pt will improve gait speed to at least 3.2 ft/sec for improved gait efficiency for community distances.    Baseline 2.86 ft/sec; 9.90 seconds = 3.31 ft/sec    Time 4    Period Weeks    Status Achieved                Plan - 05/31/21 0849     Clinical Impression Statement Today's skilled PT session focused on assesment of patient's progress toward LTGs. Patient able to meet/partially meet all LTGs at this time demonstrating improved balance, improved activity tolernace and reduced fall risk. Patient improve 30 sec chair stand test to 11 reps without UE support, FGA to 24/30, and gait speed to 3.31 ft/second. With these scores paitent demo reduced fall risk and improved safety. Reviewed and progressed HEP to  patient's tolernace. Patient demo readiness to d/c from PT services at this time.    Personal Factors and Comorbidities Comorbidity 3+;Time since onset of injury/illness/exacerbation    Comorbidities PMH: Glaucoma, osteoporosis, hypothyroidism.    Examination-Activity Limitations Locomotion Level;Stairs    Examination-Participation Restrictions Community Activity    Stability/Clinical Decision Making Stable/Uncomplicated    Rehab Potential Good    PT Frequency 2x / week    PT Duration 8 weeks    PT Treatment/Interventions ADLs/Self Care Home Management;Gait training;Stair training;Therapeutic activities;Functional mobility training;Therapeutic exercise;Balance training;Neuromuscular re-education;Patient/family education;Vestibular    PT Next Visit Plan d/c this visit    Consulted and Agree with Plan of Care Patient             Patient will benefit from skilled therapeutic intervention in order to improve the following deficits and impairments:  Abnormal gait, Decreased activity tolerance, Decreased balance, Decreased strength  Visit Diagnosis: Unsteadiness on feet  Other abnormalities of gait and mobility     Problem List Patient Active Problem List   Diagnosis Date Noted   Incarcerated femoral hernia 09/02/2019   Femoral hernia of right side with obstruction 09/01/2019  Jones Bales, PT, DPT 05/31/2021, 9:28 AM  [facility] [address] [place], [place], [address] Phone: [phone]   Fax:  [phone]  Name: Megan Molina MRN: [id number] Date of Birth: 04/19/1933

## 2021-06-03 ENCOUNTER — Ambulatory Visit: Payer: Medicare PPO | Admitting: Physical Therapy

## 2021-06-08 ENCOUNTER — Telehealth: Payer: Self-pay

## 2021-06-08 NOTE — Telephone Encounter (Signed)
Patient called inquiring if she needs full bladder for her upcoming u/s appt on Thursday.  I advised her full bladder not necessary.

## 2021-06-10 ENCOUNTER — Other Ambulatory Visit: Payer: Self-pay | Admitting: Obstetrics & Gynecology

## 2021-06-10 ENCOUNTER — Other Ambulatory Visit: Payer: Self-pay

## 2021-06-10 ENCOUNTER — Encounter: Payer: Self-pay | Admitting: Obstetrics & Gynecology

## 2021-06-10 ENCOUNTER — Ambulatory Visit (INDEPENDENT_AMBULATORY_CARE_PROVIDER_SITE_OTHER): Payer: Medicare PPO

## 2021-06-10 ENCOUNTER — Ambulatory Visit: Payer: Medicare PPO | Admitting: Obstetrics & Gynecology

## 2021-06-10 VITALS — BP 110/74

## 2021-06-10 DIAGNOSIS — N854 Malposition of uterus: Secondary | ICD-10-CM

## 2021-06-10 DIAGNOSIS — N83291 Other ovarian cyst, right side: Secondary | ICD-10-CM

## 2021-06-10 DIAGNOSIS — N958 Other specified menopausal and perimenopausal disorders: Secondary | ICD-10-CM

## 2021-06-10 DIAGNOSIS — N9489 Other specified conditions associated with female genital organs and menstrual cycle: Secondary | ICD-10-CM

## 2021-06-10 NOTE — Progress Notes (Signed)
° ° °  Megan Molina [birth year] [id number]        89 y.o.  G3P3L3  RP: F/U Rt adnexal cyst for Pelvic US  HPI: Continues to be asymptomatic with no abdominopelvic pain.  No PMB.     OB History  Gravida Para Term Preterm AB Living  3 3       3   SAB IAB Ectopic Multiple Live Births               # Outcome Date GA Lbr Len/2nd Weight Sex Delivery Anes PTL Lv  3 Para           2 Para           1 Para             Past medical history,surgical history, problem list, medications, allergies, family history and social history were all reviewed and documented in the EPIC chart.   Directed ROS with pertinent positives and negatives documented in the history of present illness/assessment and plan.  Exam:  Vitals:   06/10/21 0938  BP: 110/74   General appearance:  Normal  Pelvic US today: T/A images (historically, patient unable to tolerate vaginal exam).  Comparison is made with previous scan April 30, 2020 when the right adnexal cyst was measured at 4.8 x 4.7 x 4.2 cm.  Small anteverted uterus with peripheral calcification are noted.  The uterus is measured at 4.57 x 4.22 x 2.72 cm.  Small amount of fluid within the endometrial cavity which is unchanged compared to the previous scan.  The endometrial lining is measured at 2.45 mm.  No mass seen at the endometrium.  Neither ovary is positively identified today.  Right adnexal avascular simple cyst slightly increased in size since previous scan measured today at 5.7 x 4.7 x 4.2 cm.  Nontender.  No free fluid in the pelvis.   Assessment/Plan:  88 y.o. G3P3   1. Right Simple adnexal cyst in postmenopausal patient Continues to be asymptomatic with no abdominopelvic pain.  No PMB.  Pelvic ultrasound findings thoroughly reviewed with patient.  Simple and avascular right adnexal cyst.  No free fluid in the pelvis.  Small increase in size compared to the pelvic ultrasound more than a year ago.  Patient is reassured that the right adnexal cyst appears  benign in nature.  Decision to continue to observe.  Will repeat pelvic ultrasounds annually.  Other orders - rosuvastatin (CRESTOR) 10 MG tablet; Take by mouth.   Princess Bruins MD, 9:53 AM 06/10/2021

## 2021-06-24 IMAGING — MG DIGITAL SCREENING BILAT W/ TOMO W/ CAD
6 of 10 series · 6 of 30 positions shown · non-contrast
Comparison: Previous exam(s).

CLINICAL DATA: Screening.

EXAM:
DIGITAL SCREENING BILATERAL MAMMOGRAM WITH TOMO AND CAD

[L CC synth-2D]
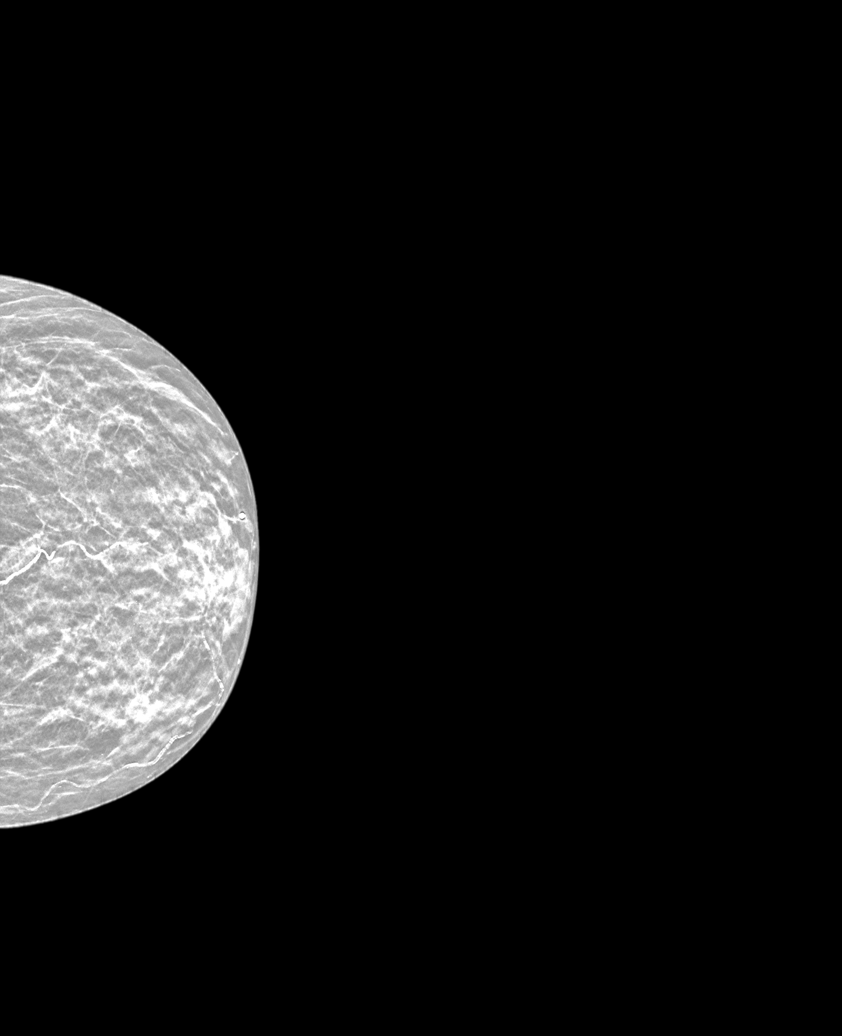

[R CC synth-2D]
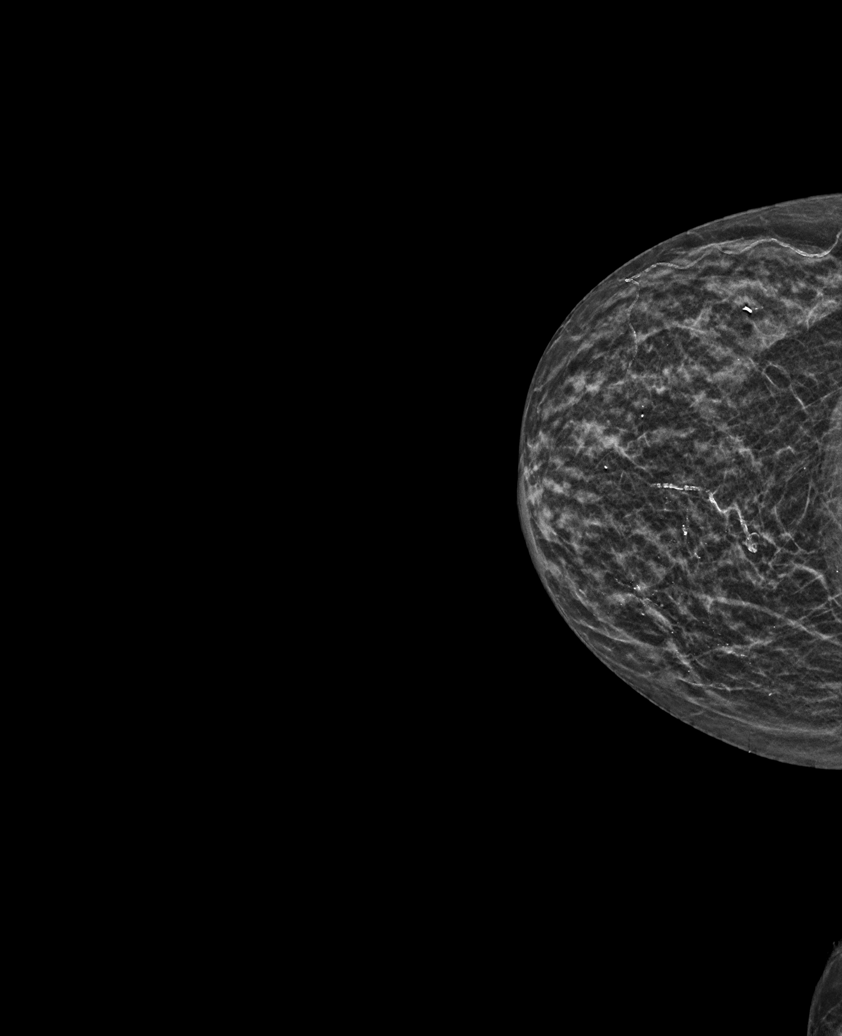

[L MLO synth-2D (1 of 2)]
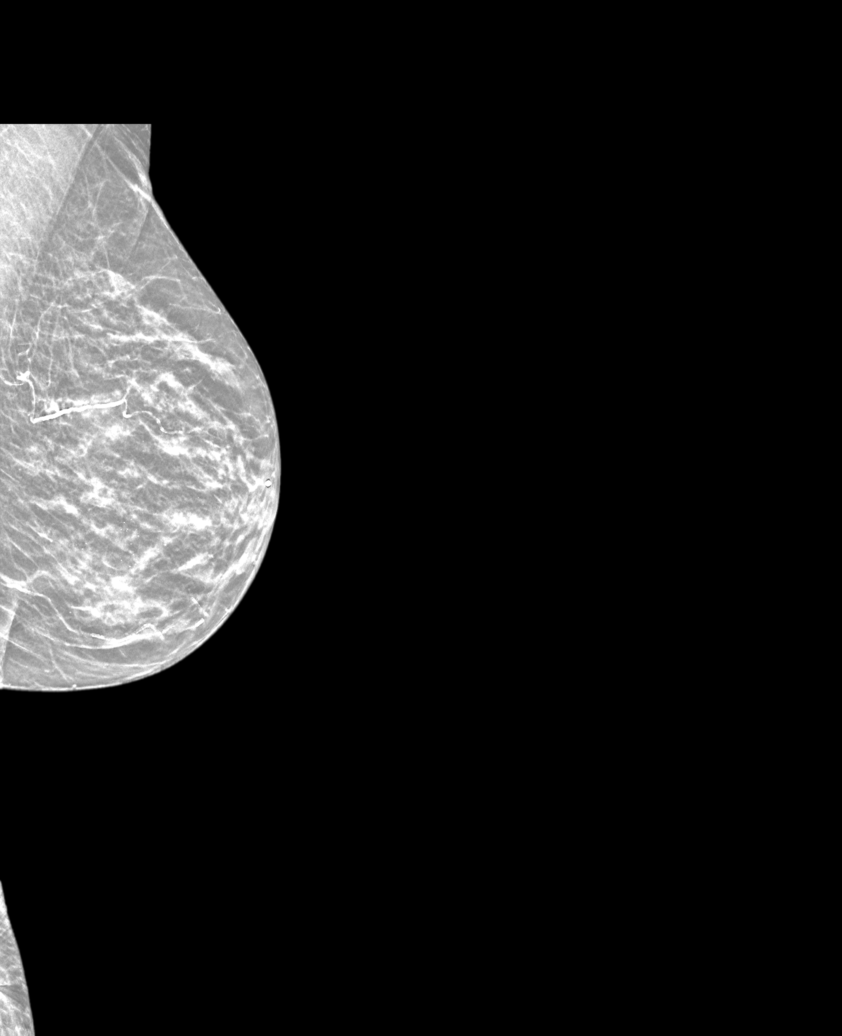

[R MLO synth-2D]
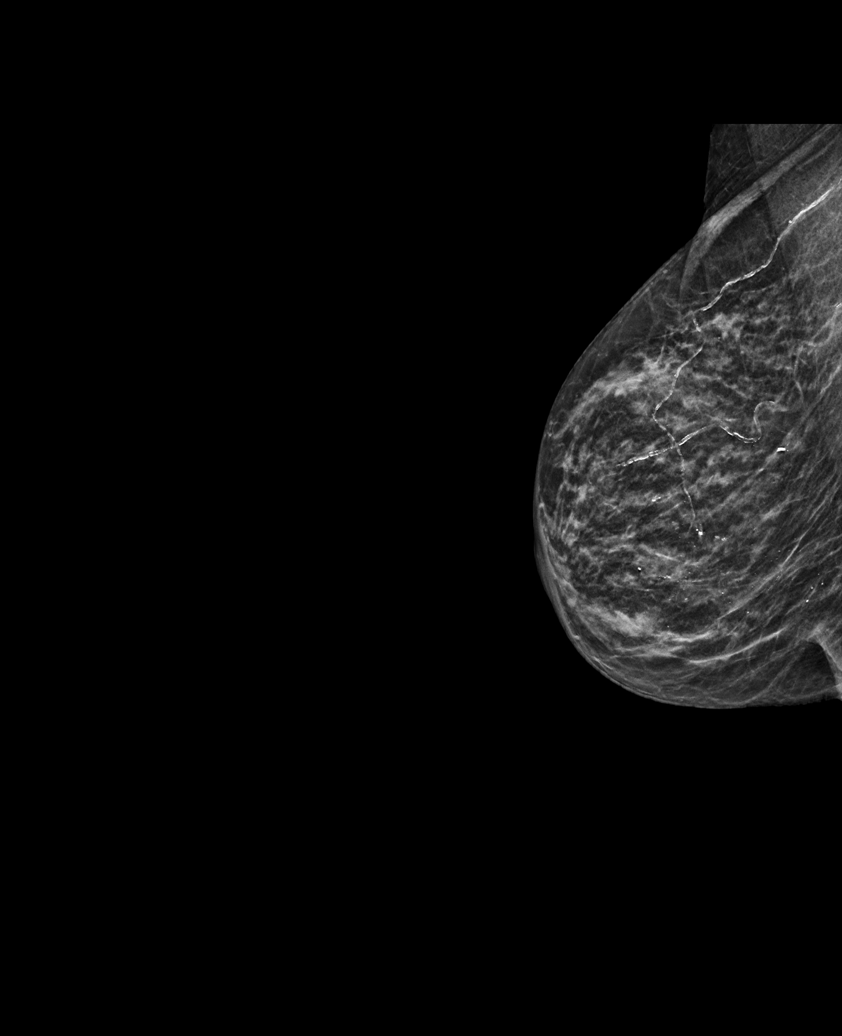

[L MLO synth-2D (2 of 2)]
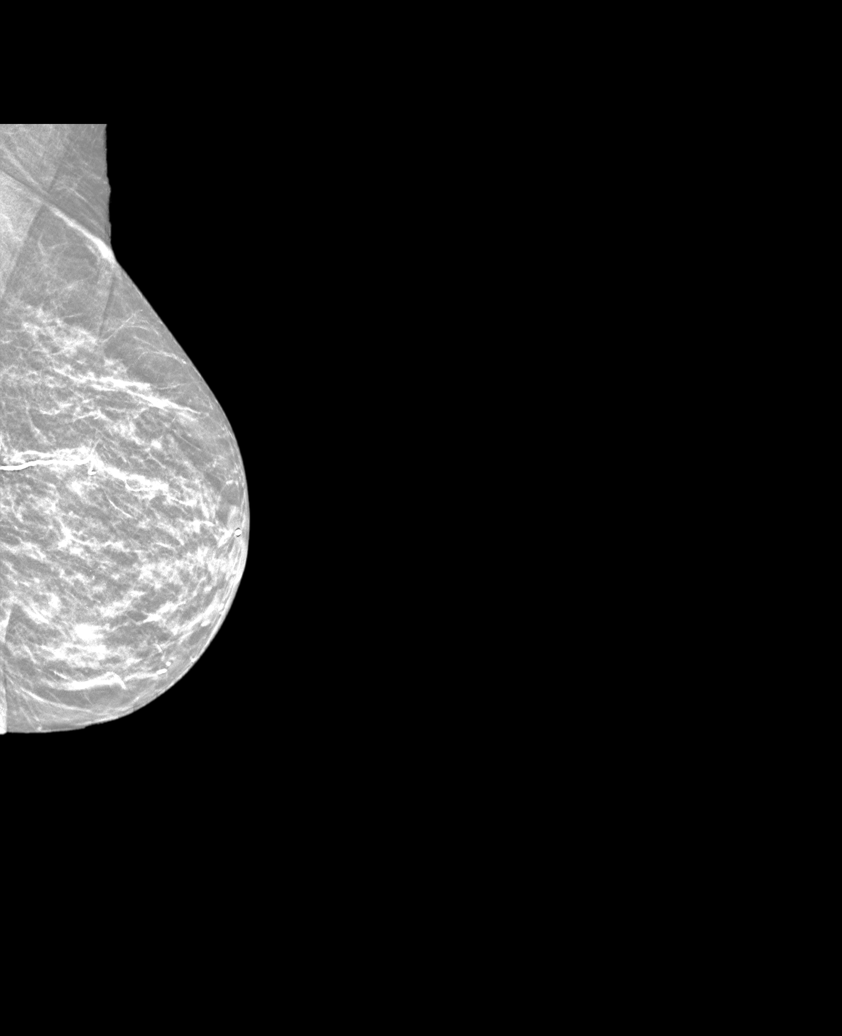

[L MLO tomo · tomo slice 19/36.0]
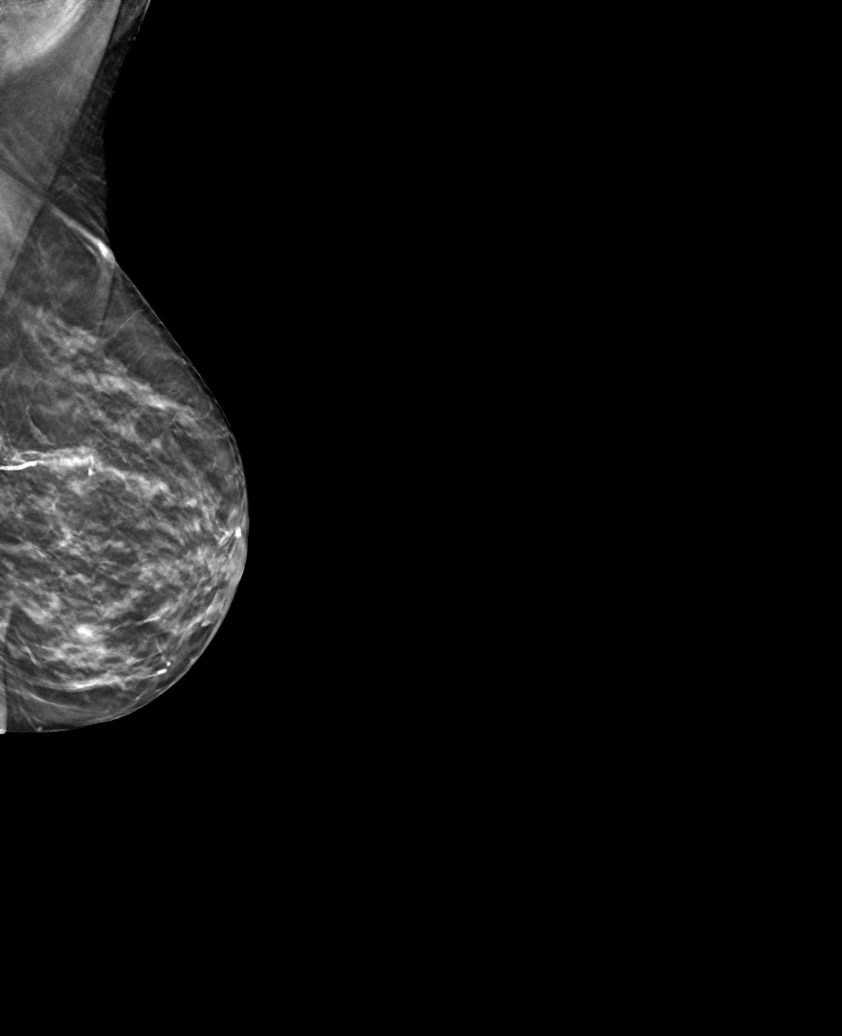

[6 of 30 positions shown; findings below may reference images not displayed]

ACR Breast Density Category c: The breast tissue is heterogeneously
dense, which may obscure small masses.
FINDINGS: There are no findings suspicious for malignancy. Images were
processed with CAD.
IMPRESSION: No mammographic evidence of malignancy. A result letter of this
screening mammogram will be mailed directly to the patient.

RECOMMENDATION:
Screening mammogram in one year. (Code:FT-U-LHB)

BI-RADS CATEGORY  1: Negative.

## 2021-09-07 ENCOUNTER — Other Ambulatory Visit: Payer: Self-pay | Admitting: Obstetrics & Gynecology

## 2021-09-07 NOTE — Telephone Encounter (Signed)
Last AEX 08/24/20--scheduled for 09/15/21. ?Last DEXA 12/10/19. ?

## 2021-09-15 ENCOUNTER — Ambulatory Visit: Payer: Medicare PPO | Admitting: Obstetrics & Gynecology

## 2021-09-28 ENCOUNTER — Other Ambulatory Visit: Payer: Self-pay | Admitting: Internal Medicine

## 2021-09-28 DIAGNOSIS — Z1231 Encounter for screening mammogram for malignant neoplasm of breast: Secondary | ICD-10-CM

## 2021-10-04 ENCOUNTER — Encounter: Payer: Self-pay | Admitting: Obstetrics & Gynecology

## 2021-10-04 ENCOUNTER — Ambulatory Visit (INDEPENDENT_AMBULATORY_CARE_PROVIDER_SITE_OTHER): Payer: Medicare PPO | Admitting: Obstetrics & Gynecology

## 2021-10-04 VITALS — BP 120/70 | HR 54 | Resp 12 | Ht 64.5 in | Wt 128.0 lb

## 2021-10-04 DIAGNOSIS — N958 Other specified menopausal and perimenopausal disorders: Secondary | ICD-10-CM

## 2021-10-04 DIAGNOSIS — N9489 Other specified conditions associated with female genital organs and menstrual cycle: Secondary | ICD-10-CM

## 2021-10-04 DIAGNOSIS — M810 Age-related osteoporosis without current pathological fracture: Secondary | ICD-10-CM

## 2021-10-04 DIAGNOSIS — Z01419 Encounter for gynecological examination (general) (routine) without abnormal findings: Secondary | ICD-10-CM | POA: Diagnosis not present

## 2021-10-04 DIAGNOSIS — Z780 Asymptomatic menopausal state: Secondary | ICD-10-CM | POA: Diagnosis not present

## 2021-10-04 MED ORDER — IBANDRONATE SODIUM 150 MG PO TABS: ORAL_TABLET | ORAL | 4 refills | Status: DC

## 2021-10-04 NOTE — Progress Notes (Signed)
Megan Molina [birth year] [id number]   History:    88 y.o. G3P3L3 Widowed x 03/2019.  English as a second language teacher.   RP:  Established patient presenting for annual gyn exam    HPI: Postmenopause, well on no HRT.  No PMB.  No pelvic pain.  Rt simple ovarian cyst 4.8 cm 04/2020.  Repeat Pelvic US here.  Abstinent.  Pap Neg in 2016.  No indication for a repeat Pap at this time. Urine/BMs normal.  Breasts normal.  Mammo Neg 11/2020.  Osteoporosis, last BD 11/2019.  Well on Boniva.  Will repeat a BD here in 11/2021.  BMI 21.63.  Health Labs with Fam MD. Colono 2013.  Bone spur on the Rt shoulder?  Will see Ortho for opinion.    Past medical history,surgical history, family history and social history were all reviewed and documented in the EPIC chart.  Gynecologic History No LMP recorded. Patient is postmenopausal.  Obstetric History OB History  Gravida Para Term Preterm AB Living  '3 3       3  '$ SAB IAB Ectopic Multiple Live Births               # Outcome Date GA Lbr Len/2nd Weight Sex Delivery Anes PTL Lv  3 Para           2 Para           1 Para              ROS: A ROS was performed and pertinent positives and negatives are included in the history.  GENERAL: No fevers or chills. HEENT: No change in vision, no earache, sore throat or sinus congestion. NECK: No pain or stiffness. CARDIOVASCULAR: No chest pain or pressure. No palpitations. PULMONARY: No shortness of breath, cough or wheeze. GASTROINTESTINAL: No abdominal pain, nausea, vomiting or diarrhea, melena or bright red blood per rectum. GENITOURINARY: No urinary frequency, urgency, hesitancy or dysuria. MUSCULOSKELETAL: No joint or muscle pain, no back pain, no recent trauma. DERMATOLOGIC: No rash, no itching, no lesions. ENDOCRINE: No polyuria, polydipsia, no heat or cold intolerance. No recent change in weight. HEMATOLOGICAL: No anemia or easy bruising or bleeding. NEUROLOGIC: No headache, seizures, numbness, tingling or weakness. PSYCHIATRIC: No  depression, no loss of interest in normal activity or change in sleep pattern.     Exam:   BP 120/70   Pulse (!) 54   Resp 12   Ht 5' 4.5" (1.638 m)   Wt 128 lb (58.1 kg)   BMI 21.63 kg/m   Body mass index is 21.63 kg/m.  General appearance : Well developed well nourished female. No acute distress HEENT: Eyes: no retinal hemorrhage or exudates,  Neck supple, trachea midline, no carotid bruits, no thyroidmegaly Lungs: Clear to auscultation, no rhonchi or wheezes, or rib retractions  Heart: Regular rate and rhythm, no murmurs or gallops Breast:Examined in sitting and supine position were symmetrical in appearance, no palpable masses or tenderness,  no skin retraction, no nipple inversion, no nipple discharge, no skin discoloration, no axillary or supraclavicular lymphadenopathy Abdomen: no palpable masses or tenderness, no rebound or guarding Extremities: no edema or skin discoloration or tenderness  Pelvic: Vulva: Normal             Vagina: No gross lesions or discharge  Cervix: No gross lesions or discharge  Uterus  AV, normal size, shape and consistency, non-tender and mobile  Adnexa  Without masses or tenderness  Anus: Normal   Assessment/Plan:  88 y.o. female  for annual exam   1. Well female exam with routine gynecological exam Postmenopause, well on no HRT.  No PMB.  No pelvic pain.  Rt simple ovarian cyst 4.8 cm 04/2020.  Repeat Pelvic US here.  Abstinent.  Pap Neg in 2016.  No indication for a repeat Pap at this time. Urine/BMs normal.  Breasts normal.  Mammo Neg 11/2020.  Osteoporosis, last BD 11/2019.  Well on Boniva.  Will repeat a BD here in 11/2021.  BMI 21.63.  Health Labs with Fam MD. Colono 2013.  Bone spur on the Rt shoulder?  Will see Ortho for opinion.   2. Postmenopause Postmenopause, well on no HRT.  No PMB.  No pelvic pain.   3. Age-related osteoporosis without current pathological fracture Osteoporosis, last BD 11/2019.  Well on Boniva.  Will repeat a BD here  in 11/2021.  Boniva represcribed.  Vit D, Ca++ total 1.5 g/d, weight bearing activities. - DG Bone Density; Future  4. Right Simple adnexal cyst in postmenopausal patient  No pelvic pain.  Rt simple ovarian cyst 4.8 cm 04/2020.  Repeat Pelvic US here.  - US Transvaginal Non-OB; Future  Other orders - ibandronate (BONIVA) 150 MG tablet; TAKE 1 TABLET BY MOUTH EVERY 30 DAYS IN THE MORNING, WITH FULL GLASS OF WATER ON AN EMPTY STOMACH. NOTHING BY MOUTH OR LIE DOWN FOR NEXT 30   Princess Bruins MD, 2:56 PM 10/04/2021

## 2021-12-14 ENCOUNTER — Other Ambulatory Visit: Payer: Self-pay | Admitting: Obstetrics & Gynecology

## 2021-12-14 ENCOUNTER — Ambulatory Visit (INDEPENDENT_AMBULATORY_CARE_PROVIDER_SITE_OTHER): Payer: Medicare PPO

## 2021-12-14 ENCOUNTER — Ambulatory Visit: Payer: Medicare PPO | Admitting: Podiatry

## 2021-12-14 DIAGNOSIS — Z780 Asymptomatic menopausal state: Secondary | ICD-10-CM

## 2021-12-14 DIAGNOSIS — Z13820 Encounter for screening for osteoporosis: Secondary | ICD-10-CM | POA: Diagnosis not present

## 2021-12-14 DIAGNOSIS — M810 Age-related osteoporosis without current pathological fracture: Secondary | ICD-10-CM

## 2021-12-20 ENCOUNTER — Ambulatory Visit: Payer: Medicare PPO

## 2021-12-28 ENCOUNTER — Ambulatory Visit: Payer: Medicare PPO | Admitting: Podiatry

## 2021-12-28 DIAGNOSIS — L603 Nail dystrophy: Secondary | ICD-10-CM

## 2021-12-28 DIAGNOSIS — M79674 Pain in right toe(s): Secondary | ICD-10-CM | POA: Diagnosis not present

## 2021-12-28 DIAGNOSIS — M79675 Pain in left toe(s): Secondary | ICD-10-CM

## 2021-12-28 DIAGNOSIS — B351 Tinea unguium: Secondary | ICD-10-CM

## 2021-12-28 NOTE — Progress Notes (Signed)
  Subjective:  Patient ID: Megan Molina, female    DOB: Oct 08, 1932,  MRN: [id number]  Chief Complaint  Patient presents with   Nail Problem    Right great toenail was removed last year and it's growing back thick and bumpy. Also concerned about left great toenail    88 y.o. female presents with the above complaint. History confirmed with patient.   Objective:  Physical Exam: warm, good capillary refill, no trophic changes or ulcerative lesions, normal DP and PT pulses, and normal sensory exam. Left Foot: dystrophic yellowed discolored nail plates with subungual debris, dystrophic hallux nail Right Foot: dystrophic yellowed discolored nail plates with subungual debris, dystrophic hallux nail  Assessment:   1. Onychodystrophy   2. Pain due to onychomycosis of toenails of both feet      Plan:  Patient was evaluated and treated and all questions answered.  Discussed the etiology and treatment options for the condition in detail with the patient. Educated patient on the treatment options for dystrophic and mycotic nails. Recommended debridement of the nails today. Sharp and mechanical debridement performed of all painful and mycotic nails today. Nails debrided in length and thickness using a nail nipper to level of comfort. Follow up as needed for painful nails.  Discussed if the left hallux nail becomes loose may need avulsion of this as well.  I do not think the right hallux nail will be normal at this point.  Discussed option of total permanent matricectomy    Return if symptoms worsen or fail to improve.

## 2022-01-06 ENCOUNTER — Ambulatory Visit: Payer: Medicare PPO

## 2022-01-27 ENCOUNTER — Ambulatory Visit
Admission: RE | Admit: 2022-01-27 | Discharge: 2022-01-27 | Disposition: A | Discharge status: Home / Self Care | Payer: Medicare PPO | Source: Ambulatory Visit | Attending: Internal Medicine | Admitting: Internal Medicine

## 2022-01-27 DIAGNOSIS — Z1231 Encounter for screening mammogram for malignant neoplasm of breast: Secondary | ICD-10-CM

## 2022-06-29 ENCOUNTER — Telehealth: Payer: Self-pay

## 2022-06-29 NOTE — Telephone Encounter (Signed)
Pt calling to inquire if she needed to drink 32oz of water before [place] appt scheduled on 06/30/2022?  LDVM on machine per DPR and pt's request. Advising pt that ideally they like for pt's to come with a full bladder to be able to obtain clearer images transabdominally (especially if pt not able to tolerate a transvaginal probe).  Advised pt to CB if any additional questions. Will route to provider for final review and close encounter.

## 2022-06-30 ENCOUNTER — Ambulatory Visit (INDEPENDENT_AMBULATORY_CARE_PROVIDER_SITE_OTHER): Payer: Medicare PPO | Admitting: Obstetrics & Gynecology

## 2022-06-30 ENCOUNTER — Other Ambulatory Visit: Payer: Self-pay | Admitting: Obstetrics & Gynecology

## 2022-06-30 ENCOUNTER — Ambulatory Visit (INDEPENDENT_AMBULATORY_CARE_PROVIDER_SITE_OTHER): Payer: Medicare PPO

## 2022-06-30 ENCOUNTER — Encounter: Payer: Self-pay | Admitting: Obstetrics & Gynecology

## 2022-06-30 VITALS — BP 120/78 | HR 68 | Resp 16

## 2022-06-30 DIAGNOSIS — N958 Other specified menopausal and perimenopausal disorders: Secondary | ICD-10-CM

## 2022-06-30 DIAGNOSIS — M810 Age-related osteoporosis without current pathological fracture: Secondary | ICD-10-CM

## 2022-06-30 DIAGNOSIS — N83299 Other ovarian cyst, unspecified side: Secondary | ICD-10-CM

## 2022-06-30 DIAGNOSIS — N9489 Other specified conditions associated with female genital organs and menstrual cycle: Secondary | ICD-10-CM | POA: Diagnosis not present

## 2022-06-30 DIAGNOSIS — Z01419 Encounter for gynecological examination (general) (routine) without abnormal findings: Secondary | ICD-10-CM

## 2022-06-30 DIAGNOSIS — Z780 Asymptomatic menopausal state: Secondary | ICD-10-CM

## 2022-06-30 NOTE — Progress Notes (Signed)
    Megan Molina 1932/09/05 [id number]        89 y.o.  G3P3003   RP: Rt simple adnexal cyst for f/u Pelvic US  HPI:  Continues to be asymptomatic with no abdominopelvic pain.  No PMB.      OB History  Gravida Para Term Preterm AB Living  '3 3 3     3  '$ SAB IAB Ectopic Multiple Live Births               # Outcome Date GA Lbr Len/2nd Weight Sex Delivery Anes PTL Lv  3 Term           2 Term           1 Term             Past medical history,surgical history, problem list, medications, allergies, family history and social history were all reviewed and documented in the EPIC chart.   Directed ROS with pertinent positives and negatives documented in the history of present illness/assessment and plan.  Exam:  Vitals:   06/30/22 1024  BP: 120/78  Pulse: 68  Resp: 16   General appearance:  Normal  Pelvic US today: Comparison is made with previous scan February 2023 (right adnexal cyst measured at 5.7 x 4.7 x 4.2 cm at that time).  T/a images.  Anteverted small uterus with peripheral calcifications again noted.  The uterus is measured at 5.76 x 4.4 x 3.01 cm.  Small amount of fluid remains within the uterine cavity as on previous scans.  The endometrial walls are thin and smooth with a combined thickness at 2.8 mm.  Neither ovary is positively identified as on previous scans.  Right adnexal thin-walled, simple cystic structure is slightly larger than on the previous scan today measured at 6.5 x 5 x 5 cm.  No free fluid in the pelvis.   Assessment/Plan:  89 y.o. G3P3003   1. Right Simple adnexal cyst in postmenopausal patient As symptomatic 89 year old woman with a benign appearing right adnexal simple, thin-walled cyst, only increased in size by a few millimeters since last year.  No free fluid in the pelvis.  Patient reassured about the benign appearance of this right adnexal cyst followed for many many years.  Recommend no further investigation.  Patient agrees with plan.  Other  orders - tretinoin (RETIN-A) 0.05 % cream; Apply topically at bedtime. - clobetasol (OLUX) 0.05 % topical foam; SMARTSIG:1 Sparingly Topical Daily   Princess Bruins MD, 11:00 AM 06/30/2022

## 2022-07-25 ENCOUNTER — Ambulatory Visit
Admission: RE | Admit: 2022-07-25 | Discharge: 2022-07-25 | Disposition: A | Discharge status: Home / Self Care | Payer: Medicare PPO | Source: Ambulatory Visit | Attending: Family Medicine | Admitting: Family Medicine

## 2022-07-25 ENCOUNTER — Other Ambulatory Visit: Payer: Self-pay | Admitting: Family Medicine

## 2022-07-25 DIAGNOSIS — M25552 Pain in left hip: Secondary | ICD-10-CM

## 2022-09-27 ENCOUNTER — Other Ambulatory Visit: Payer: Self-pay | Admitting: Obstetrics & Gynecology

## 2022-09-27 DIAGNOSIS — M810 Age-related osteoporosis without current pathological fracture: Secondary | ICD-10-CM

## 2022-09-28 NOTE — Telephone Encounter (Signed)
Med refill request: ibandronate 150mg  Last AEX: 10/04/21 Last OV: 07/01/22 Next AEX: 11/17/22 Last DEXA:  12/14/21 Refill authorized: Please Advise?

## 2022-10-07 ENCOUNTER — Ambulatory Visit: Payer: Medicare PPO | Admitting: Obstetrics & Gynecology

## 2022-10-31 ENCOUNTER — Other Ambulatory Visit: Payer: Self-pay | Admitting: Internal Medicine

## 2022-10-31 DIAGNOSIS — Z1231 Encounter for screening mammogram for malignant neoplasm of breast: Secondary | ICD-10-CM

## 2022-11-17 ENCOUNTER — Encounter: Payer: Self-pay | Admitting: Radiology

## 2022-11-17 ENCOUNTER — Ambulatory Visit (INDEPENDENT_AMBULATORY_CARE_PROVIDER_SITE_OTHER): Payer: Medicare PPO | Admitting: Radiology

## 2022-11-17 VITALS — BP 126/70 | Ht 64.75 in | Wt 125.0 lb

## 2022-11-17 DIAGNOSIS — Z01419 Encounter for gynecological examination (general) (routine) without abnormal findings: Secondary | ICD-10-CM

## 2022-11-17 DIAGNOSIS — N9489 Other specified conditions associated with female genital organs and menstrual cycle: Secondary | ICD-10-CM | POA: Diagnosis not present

## 2022-11-17 DIAGNOSIS — M810 Age-related osteoporosis without current pathological fracture: Secondary | ICD-10-CM | POA: Diagnosis not present

## 2022-11-17 DIAGNOSIS — N958 Other specified menopausal and perimenopausal disorders: Secondary | ICD-10-CM | POA: Diagnosis not present

## 2022-11-17 MED ORDER — IBANDRONATE SODIUM 150 MG PO TABS: ORAL_TABLET | ORAL | 4 refills | Status: DC

## 2022-11-17 NOTE — Progress Notes (Signed)
DARIELLE HANCHER 01/20/1933 [id number]   89 y.o. G3P3L3 Widowed x 03/2019 here for annual exam.Postmenopausal,doing well on no HRT.  No PMB.  No pelvic pain.  Rt simple ovarian cyst has been followed since 2022.   Abstinent.   Pap Neg in 2016 normal.   Mammo Neg 2023 DEXA:  2023 Osteoporosis,on Boniva.   Colonoscopy 2013, no repeat   Past medical history,surgical history, family history and social history were all reviewed and documented in the EPIC chart.  Gynecologic History No LMP recorded. Patient is postmenopausal.  Obstetric History OB History  Gravida Para Term Preterm AB Living  3 3 3     3   SAB IAB Ectopic Multiple Live Births               # Outcome Date GA Lbr Len/2nd Weight Sex Type Anes PTL Lv  3 Term           2 Term           1 Term              Review of Systems  All other systems reviewed and are negative.     Past Medical History:  Diagnosis Date   Glaucoma    Hypercholesteremia    Hypothyroid    Osteopenia 06/2016   T score -2.4 stable from prior DEXA.    Current Outpatient Medications on File Prior to Visit  Medication Sig Dispense Refill   Ascorbic Acid (VITAMIN C PO) Take by mouth.     aspirin 81 MG tablet Take 81 mg by mouth every other day.      Calcium Carb-Cholecalciferol (CALCIUM 500 + D3 PO) Take 500 mg by mouth.     Cholecalciferol (VITAMIN D PO) Take 2,000 Units by mouth.     Cyanocobalamin (VITAMIN B-12 PO) Take by mouth.     dorzolamide (TRUSOPT) 2 % ophthalmic solution Place 1 drop into both eyes 2 (two) times daily.      fish oil-omega-3 fatty acids 1000 MG capsule Take by mouth daily.     levothyroxine (SYNTHROID) 25 MCG tablet Take 25 mcg by mouth every other day.      levothyroxine (SYNTHROID) 50 MCG tablet Take 50 mcg by mouth every other day.      rosuvastatin (CRESTOR) 10 MG tablet Take by mouth.     tretinoin (RETIN-A) 0.05 % cream Apply topically at bedtime.     clobetasol (OLUX) 0.05 % topical foam SMARTSIG:1 Sparingly  Topical Daily (Patient not taking: Reported on 11/17/2022)     No current facility-administered medications on file prior to visit.    Exam:   BP 126/70 (BP Location: Left Arm, Patient Position: Sitting, Cuff Size: Normal)   Ht 5' 4.75" (1.645 m)   Wt 125 lb (56.7 kg)   BMI 20.96 kg/m   Body mass index is 20.96 kg/m.  General appearance : Well developed well nourished female. No acute distress HEENT: Eyes: no retinal hemorrhage or exudates,  Neck supple, trachea midline, no carotid bruits, no thyroidmegaly Lungs: Clear to auscultation, no rhonchi or wheezes, or rib retractions  Heart: Regular rate and rhythm, no murmurs or gallops Breast:Examined in sitting and supine position were symmetrical in appearance, no palpable masses or tenderness,  no skin retraction, no nipple inversion, no nipple discharge, no skin discoloration, no axillary or supraclavicular lymphadenopathy Abdomen: no palpable masses or tenderness, no rebound or guarding Extremities: no edema or skin discoloration or tenderness  Pelvic: Vulva: Normal  Vagina: No gross lesions or discharge  Cervix: No gross lesions or discharge  Uterus  AV, normal size, shape and consistency, non-tender and mobile  Adnexa  Without masses or tenderness  Anus: Normal   Assessment/Plan:  89 y.o. female for annual exam   1. Right Simple adnexal cyst in postmenopausal patient - Requests follow up u/s at the suggestion of her PCP - US PELVIC COMPLETE WITH TRANSVAGINAL; Future  2. Age-related osteoporosis without current pathological fracture - Dexa due 2025 - ibandronate (BONIVA) 150 MG tablet; TAKE 1 TABLET BY MOUTH EVERY 30 DAYS IN THE MORNING, WITH FULL GLASS OF WATER ON AN EMPTY STOMACH. NOTHING BY MOUTH OR LIE DOWN FOR NEXT 30  Dispense: 3 tablet; Refill: 4  3. Well woman exam with routine gynecological exam  Mammogram due 10/24 Overdue for colonscopy elects not to have  Arlie Solomons B MD, 10:17 AM  11/17/2022

## 2022-11-28 ENCOUNTER — Other Ambulatory Visit: Payer: Medicare PPO

## 2022-12-05 ENCOUNTER — Other Ambulatory Visit: Payer: Self-pay | Admitting: Internal Medicine

## 2022-12-05 ENCOUNTER — Ambulatory Visit
Admission: RE | Admit: 2022-12-05 | Discharge: 2022-12-05 | Disposition: A | Discharge status: Home / Self Care | Payer: Medicare PPO | Source: Ambulatory Visit | Attending: Internal Medicine | Admitting: Internal Medicine

## 2022-12-05 DIAGNOSIS — R6884 Jaw pain: Secondary | ICD-10-CM

## 2022-12-08 ENCOUNTER — Observation Stay (HOSPITAL_COMMUNITY)
Admission: EM | Admit: 2022-12-08 | Discharge: 2022-12-10 | Disposition: A | Discharge status: Home / Self Care | Payer: Medicare PPO | Attending: Internal Medicine | Admitting: Internal Medicine

## 2022-12-08 ENCOUNTER — Emergency Department (HOSPITAL_COMMUNITY): Payer: Medicare PPO

## 2022-12-08 ENCOUNTER — Other Ambulatory Visit: Payer: Self-pay

## 2022-12-08 DIAGNOSIS — Z7982 Long term (current) use of aspirin: Secondary | ICD-10-CM | POA: Diagnosis not present

## 2022-12-08 DIAGNOSIS — I4891 Unspecified atrial fibrillation: Principal | ICD-10-CM | POA: Diagnosis present

## 2022-12-08 DIAGNOSIS — Z79899 Other long term (current) drug therapy: Secondary | ICD-10-CM | POA: Insufficient documentation

## 2022-12-08 DIAGNOSIS — E039 Hypothyroidism, unspecified: Secondary | ICD-10-CM

## 2022-12-08 DIAGNOSIS — E785 Hyperlipidemia, unspecified: Secondary | ICD-10-CM

## 2022-12-08 DIAGNOSIS — Z1152 Encounter for screening for COVID-19: Secondary | ICD-10-CM | POA: Diagnosis not present

## 2022-12-08 DIAGNOSIS — R002 Palpitations: Secondary | ICD-10-CM | POA: Diagnosis present

## 2022-12-08 DIAGNOSIS — Z87891 Personal history of nicotine dependence: Secondary | ICD-10-CM | POA: Diagnosis not present

## 2022-12-08 DIAGNOSIS — R112 Nausea with vomiting, unspecified: Secondary | ICD-10-CM | POA: Insufficient documentation

## 2022-12-08 LAB — CBC
HCT: 46.4 % — ABNORMAL HIGH (ref 36.0–46.0)
Hemoglobin: 14.8 g/dL (ref 12.0–15.0)
MCH: 30 pg (ref 26.0–34.0)
MCHC: 31.9 g/dL (ref 30.0–36.0)
MCV: 94.1 fL (ref 80.0–100.0)
Platelets: 269 K/uL (ref 150–400)
RBC: 4.93 MIL/uL (ref 3.87–5.11)
RDW: 13.3 % (ref 11.5–15.5)
WBC: 10.2 K/uL (ref 4.0–10.5)
nRBC: 0 % (ref 0.0–0.2)

## 2022-12-08 LAB — BASIC METABOLIC PANEL WITH GFR
Anion gap: 15 (ref 5–15)
BUN: 20 mg/dL (ref 8–23)
CO2: 21 mmol/L — ABNORMAL LOW (ref 22–32)
Calcium: 9.3 mg/dL (ref 8.9–10.3)
Chloride: 100 mmol/L (ref 98–111)
Creatinine, Ser: 0.78 mg/dL (ref 0.44–1.00)
GFR, Estimated: >60 mL/min
Glucose, Bld: 122 mg/dL — ABNORMAL HIGH (ref 70–99)
Potassium: 3.6 mmol/L (ref 3.5–5.1)
Sodium: 136 mmol/L (ref 135–145)

## 2022-12-08 LAB — TSH: TSH: 1.358 u[IU]/mL (ref 0.350–4.500)

## 2022-12-08 LAB — I-STAT CHEM 8, ED
BUN: 21 mg/dL (ref 8–23)
Calcium, Ion: 1.15 mmol/L (ref 1.15–1.40)
Chloride: 103 mmol/L (ref 98–111)
Creatinine, Ser: 0.7 mg/dL (ref 0.44–1.00)
Glucose, Bld: 127 mg/dL — ABNORMAL HIGH (ref 70–99)
HCT: 45 % (ref 36.0–46.0)
Hemoglobin: 15.3 g/dL — ABNORMAL HIGH (ref 12.0–15.0)
Potassium: 3.6 mmol/L (ref 3.5–5.1)
Sodium: 137 mmol/L (ref 135–145)
TCO2: 21 mmol/L — ABNORMAL LOW (ref 22–32)

## 2022-12-08 LAB — TROPONIN I (HIGH SENSITIVITY): Troponin I (High Sensitivity): 5 ng/L (ref <18)

## 2022-12-08 MED ORDER — DILTIAZEM LOAD VIA INFUSION
10.0000 mg | Freq: Once | INTRAVENOUS | Status: AC
  Administered 2022-12-08: 10 mg via INTRAVENOUS
  Filled 2022-12-08: qty 10

## 2022-12-08 MED ORDER — DILTIAZEM HCL-DEXTROSE 125-5 MG/125ML-% IV SOLN (PREMIX)
5.0000 mg/h | INTRAVENOUS | Status: DC
  Administered 2022-12-08: 5 mg/h via INTRAVENOUS
  Filled 2022-12-08: qty 125

## 2022-12-08 NOTE — ED Triage Notes (Signed)
Pt bib [facility] from home she was outside c/o a racing heart. Denies chest pain, sob, n/v/d. Ems placed on monitor pt in afib rvr hr 90-140. Bp- 162/76 Hr- 107 96% RA

## 2022-12-08 NOTE — ED Provider Notes (Signed)
[facility] Emergency Department Provider Note MRN:  [id number]  Arrival date & time: 12/09/22     Chief Complaint   Atrial Fibrillation   History of Present Illness   Megan Molina is a 89 y.o. year-old female presents to the ED with chief complaint of SOB.  States that she didn't feel right, so she took her pulse and her BP and found it to be high, so she came to the ER for evaluation.  She states that her doctor is concerned about her heart because she has been having some intermittent jaw pains.  She states that he told her she needed to have a stress test done and she is getting set up with that.  She states that she hasn't had chest pains.  She is unable to clearly states when her symptoms started and notes that she has been having some of these jaw pains for a while.  She takes aspirin every other day. She is not anticoagulated.  History provided by patient.   Review of Systems  Pertinent positive and negative review of systems noted in HPI.    Physical Exam   Vitals:   12/08/22 2300 12/08/22 2330  BP: (!) 186/89 124/73  Pulse:  (!) 109  Resp: (!) 27 20  Temp:    SpO2:  95%    CONSTITUTIONAL:  well-appearing, NAD NEURO:  Alert and oriented x 3, CN 3-12 grossly intact EYES:  eyes equal and reactive ENT/NECK:  Supple, no stridor  CARDIO:  tachycardic, irregularly irregular rhythm, appears well-perfused  PULM:  No respiratory distress, CTAB GI/GU:  non-distended,  MSK/SPINE:  No gross deformities, no edema, moves all extremities  SKIN:  no rash, atraumatic   *Additional and/or pertinent findings included in MDM below  Diagnostic and Interventional Summary    EKG Interpretation Date/Time:  Thursday December 08 2022 22:16:33 EDT Ventricular Rate:  106 PR Interval:    QRS Duration:  73 QT Interval:  313 QTC Calculation: 416 R Axis:   -18  Text Interpretation: Atrial fibrillation Ventricular premature complex Borderline left axis deviation  Borderline ST depression, anterolateral leads new from prior 5/21 Confirmed by Meridee Score [phone]) on 12/08/2022 10:21:18 PM       Labs Reviewed  BASIC METABOLIC PANEL - Abnormal; Notable for the following components:      Result Value   CO2 21 (*)    Glucose, Bld 122 (*)    All other components within normal limits  CBC - Abnormal; Notable for the following components:   HCT 46.4 (*)    All other components within normal limits  I-STAT CHEM 8, ED - Abnormal; Notable for the following components:   Glucose, Bld 127 (*)    TCO2 21 (*)    Hemoglobin 15.3 (*)    All other components within normal limits  SARS CORONAVIRUS 2 BY RT PCR  TSH  TROPONIN I (HIGH SENSITIVITY)  TROPONIN I (HIGH SENSITIVITY)    DG Chest Port 1 View  Final Result      Medications  diltiazem (CARDIZEM) 1 mg/mL load via infusion 10 mg (10 mg Intravenous Bolus from Bag 12/08/22 2303)    And  diltiazem (CARDIZEM) 125 mg in dextrose 5% 125 mL (1 mg/mL) infusion (7.5 mg/hr Intravenous Infusion Verify 12/08/22 2335)     Procedures  /  Critical Care .Critical Care  Performed by: Roxy Horseman, PA-C Authorized by: Roxy Horseman, PA-C   Critical care provider statement:    Critical care time (  minutes):  44   Critical care was necessary to treat or prevent imminent or life-threatening deterioration of the following conditions:  Circulatory failure   Critical care was time spent personally by me on the following activities:  Development of treatment plan with patient or surrogate, discussions with consultants, evaluation of patient's response to treatment, examination of patient, ordering and review of laboratory studies, ordering and review of radiographic studies, ordering and performing treatments and interventions, pulse oximetry, re-evaluation of patient's condition and review of old charts   ED Course and Medical Decision Making  I have reviewed the triage vital signs, the nursing notes, and  pertinent available records from the EMR.  Social Determinants Affecting Complexity of Care: Patient has no clinically significant social determinants affecting this chief complaint..   ED Course: Clinical Course as of 12/09/22 0007  Thu Dec 08, 2022  3640 89 year old female who has had a history of irregular heartbeat here with feeling her heart racing.  Causes her to feel little short of breath.  Not completely clear when this might of started.  She denies any chest pain or fever.  Getting some lab work and likely need some rate control.  Possible admission. [MB]    Clinical Course User Index [MB] Terrilee Files, MD    Medical Decision Making Amount and/or Complexity of Data Reviewed Labs: ordered.    Details: Trop 5 BMP without significant abnormalities No leukocytosis  Radiology: ordered.  Risk Prescription drug management. Decision regarding hospitalization.         Consultants: I consulted with Hospitalist, Dr. Loney Loh, who is appreciated for admitting.   Treatment and Plan: Patient's exam and diagnostic results are concerning for new onset a-fib.  Feel that patient will need admission to the hospital for further treatment and evaluation.  Patient discussed with attending physician, Dr. Charm Barges.  Final Clinical Impressions(s) / ED Diagnoses     ICD-10-CM   1. Atrial fibrillation with RVR (HCC)  I48.91       ED Discharge Orders     None         Discharge Instructions Discussed with and Provided to Patient:   Discharge Instructions   None      Roxy Horseman, PA-C 12/09/22 0009    Terrilee Files, MD 12/09/22 1314

## 2022-12-09 ENCOUNTER — Other Ambulatory Visit (HOSPITAL_COMMUNITY): Payer: Self-pay

## 2022-12-09 ENCOUNTER — Observation Stay (HOSPITAL_BASED_OUTPATIENT_CLINIC_OR_DEPARTMENT_OTHER): Payer: Medicare PPO

## 2022-12-09 DIAGNOSIS — E785 Hyperlipidemia, unspecified: Secondary | ICD-10-CM | POA: Diagnosis not present

## 2022-12-09 DIAGNOSIS — Z7982 Long term (current) use of aspirin: Secondary | ICD-10-CM | POA: Diagnosis not present

## 2022-12-09 DIAGNOSIS — I4891 Unspecified atrial fibrillation: Secondary | ICD-10-CM | POA: Diagnosis present

## 2022-12-09 DIAGNOSIS — E039 Hypothyroidism, unspecified: Secondary | ICD-10-CM | POA: Diagnosis not present

## 2022-12-09 DIAGNOSIS — Z87891 Personal history of nicotine dependence: Secondary | ICD-10-CM | POA: Diagnosis not present

## 2022-12-09 LAB — COMPREHENSIVE METABOLIC PANEL
ALT: 60 U/L — ABNORMAL HIGH (ref 0–44)
AST: 44 U/L — ABNORMAL HIGH (ref 15–41)
Albumin: 3.5 g/dL (ref 3.5–5.0)
Alkaline Phosphatase: 36 U/L — ABNORMAL LOW (ref 38–126)
Anion gap: 8 (ref 5–15)
BUN: 14 mg/dL (ref 8–23)
CO2: 26 mmol/L (ref 22–32)
Calcium: 8.8 mg/dL — ABNORMAL LOW (ref 8.9–10.3)
Chloride: 102 mmol/L (ref 98–111)
Creatinine, Ser: 0.84 mg/dL (ref 0.44–1.00)
GFR, Estimated: >60 mL/min (ref >60)
Glucose, Bld: 137 mg/dL — ABNORMAL HIGH (ref 70–99)
Potassium: 4.2 mmol/L (ref 3.5–5.1)
Sodium: 136 mmol/L (ref 135–145)
Total Bilirubin: 1 mg/dL (ref 0.3–1.2)
Total Protein: 6 g/dL — ABNORMAL LOW (ref 6.5–8.1)

## 2022-12-09 LAB — BASIC METABOLIC PANEL
Anion gap: 11 (ref 5–15)
BUN: 19 mg/dL (ref 8–23)
CO2: 24 mmol/L (ref 22–32)
Calcium: 8.9 mg/dL (ref 8.9–10.3)
Chloride: 103 mmol/L (ref 98–111)
Creatinine, Ser: 0.75 mg/dL (ref 0.44–1.00)
GFR, Estimated: >60 mL/min (ref >60)
Glucose, Bld: 106 mg/dL — ABNORMAL HIGH (ref 70–99)
Potassium: 3.9 mmol/L (ref 3.5–5.1)
Sodium: 138 mmol/L (ref 135–145)

## 2022-12-09 LAB — TROPONIN I (HIGH SENSITIVITY): Troponin I (High Sensitivity): 16 ng/L (ref <18)

## 2022-12-09 LAB — ECHOCARDIOGRAM COMPLETE
Height: 65 in
S' Lateral: 2.2 cm
Weight: 2000 oz

## 2022-12-09 LAB — MAGNESIUM: Magnesium: 2 mg/dL (ref 1.7–2.4)

## 2022-12-09 LAB — HEPARIN LEVEL (UNFRACTIONATED): Heparin Unfractionated: 0.69 [IU]/mL (ref 0.30–0.70)

## 2022-12-09 LAB — SARS CORONAVIRUS 2 BY RT PCR: SARS Coronavirus 2 by RT PCR: NEGATIVE

## 2022-12-09 MED ORDER — ROSUVASTATIN CALCIUM 5 MG PO TABS
10.0000 mg | ORAL_TABLET | Freq: Every day | ORAL | Status: DC
  Administered 2022-12-09 – 2022-12-10 (×2): 10 mg via ORAL
  Filled 2022-12-09 (×2): qty 2

## 2022-12-09 MED ORDER — ACETAMINOPHEN 325 MG PO TABS: 650.0000 mg | ORAL_TABLET | Freq: Four times a day (QID) | ORAL | Status: DC | PRN

## 2022-12-09 MED ORDER — HEPARIN (PORCINE) 25000 UT/250ML-% IV SOLN
800.0000 [IU]/h | INTRAVENOUS | Status: DC
  Administered 2022-12-09: 850 [IU]/h via INTRAVENOUS
  Administered 2022-12-10: 800 [IU]/h via INTRAVENOUS
  Filled 2022-12-09 (×2): qty 250

## 2022-12-09 MED ORDER — LEVOTHYROXINE SODIUM 25 MCG PO TABS: 25.0000 ug | ORAL_TABLET | ORAL | Status: DC

## 2022-12-09 MED ORDER — DORZOLAMIDE HCL 2 % OP SOLN
1.0000 [drp] | Freq: Two times a day (BID) | OPHTHALMIC | Status: DC
  Administered 2022-12-09 – 2022-12-10 (×3): 1 [drp] via OPHTHALMIC
  Filled 2022-12-09: qty 10

## 2022-12-09 MED ORDER — LEVOTHYROXINE SODIUM 50 MCG PO TABS
50.0000 ug | ORAL_TABLET | ORAL | Status: DC
  Administered 2022-12-09: 50 ug via ORAL
  Filled 2022-12-09: qty 2

## 2022-12-09 MED ORDER — DILTIAZEM HCL 30 MG PO TABS
30.0000 mg | ORAL_TABLET | Freq: Four times a day (QID) | ORAL | Status: DC
  Administered 2022-12-09 – 2022-12-10 (×3): 30 mg via ORAL
  Filled 2022-12-09 (×3): qty 1

## 2022-12-09 MED ORDER — LEVOTHYROXINE SODIUM 25 MCG PO TABS
25.0000 ug | ORAL_TABLET | ORAL | Status: DC
  Administered 2022-12-10: 25 ug via ORAL
  Filled 2022-12-09: qty 1

## 2022-12-09 MED ORDER — ACETAMINOPHEN 650 MG RE SUPP: 650.0000 mg | Freq: Four times a day (QID) | RECTAL | Status: DC | PRN

## 2022-12-09 MED ORDER — HEPARIN BOLUS VIA INFUSION
3000.0000 [IU] | Freq: Once | INTRAVENOUS | Status: AC
  Administered 2022-12-09: 3000 [IU] via INTRAVENOUS
  Filled 2022-12-09: qty 3000

## 2022-12-09 MED ORDER — LEVOTHYROXINE SODIUM 25 MCG PO TABS: 50.0000 ug | ORAL_TABLET | ORAL | Status: DC

## 2022-12-09 MED ORDER — LACTATED RINGERS IV BOLUS
500.0000 mL | Freq: Once | INTRAVENOUS | Status: AC
  Administered 2022-12-09: 500 mL via INTRAVENOUS

## 2022-12-09 MED ORDER — POTASSIUM CHLORIDE CRYS ER 20 MEQ PO TBCR
40.0000 meq | EXTENDED_RELEASE_TABLET | Freq: Once | ORAL | Status: AC
  Administered 2022-12-09: 40 meq via ORAL
  Filled 2022-12-09: qty 2

## 2022-12-09 NOTE — ED Provider Notes (Incomplete)
[facility] Emergency Department Provider Note MRN:  [id number]  Arrival date & time: 12/08/22     Chief Complaint   Atrial Fibrillation   History of Present Illness   Megan Molina is a 89 y.o. year-old female presents to the ED with chief complaint of SOB.  States that she didn't feel right, so she took her pulse and her BP and found it to be high, so she came to the ER for evaluation.  She states that her doctor is concerned about her heart because she has been having some intermittent jaw pains.  She states that he told her she needed to have a stress test done and she is getting set up with that.  She states that she hasn't had chest pains.  She is unable to clearly states when her symptoms started and notes that she has been having some of these jaw pains for a while.  She takes aspirin every other day. She is not anticoagulated.  History provided by patient. {RB interpreter (Optional):27221}  Review of Systems  Pertinent positive and negative review of systems noted in HPI.    Physical Exam   Vitals:   12/08/22 2217  BP: (!) 166/83  Pulse: (!) 132  Resp: 20  Temp: 99.8 F (37.7 C)  SpO2: 97%    CONSTITUTIONAL:  well-appearing, NAD NEURO:  Alert and oriented x 3, CN 3-12 grossly intact EYES:  eyes equal and reactive ENT/NECK:  Supple, no stridor  CARDIO:  tachycardic, irregularly irregular rhythm, appears well-perfused  PULM:  No respiratory distress, CTAB GI/GU:  non-distended,  MSK/SPINE:  No gross deformities, no edema, moves all extremities  SKIN:  no rash, atraumatic   *Additional and/or pertinent findings included in MDM below  Diagnostic and Interventional Summary    EKG Interpretation Date/Time:  Thursday December 08 2022 22:16:33 EDT Ventricular Rate:  106 PR Interval:    QRS Duration:  73 QT Interval:  313 QTC Calculation: 416 R Axis:   -18  Text Interpretation: Atrial fibrillation Ventricular premature complex Borderline left  axis deviation Borderline ST depression, anterolateral leads new from prior 5/21 Confirmed by Meridee Score [phone]) on 12/08/2022 10:21:18 PM       Labs Reviewed  SARS CORONAVIRUS 2 BY RT PCR  BASIC METABOLIC PANEL  CBC  I-STAT CHEM 8, ED  TROPONIN I (HIGH SENSITIVITY)    DG Chest Port 1 View    (Results Pending)    Medications  diltiazem (CARDIZEM) 1 mg/mL load via infusion 10 mg (has no administration in time range)    And  diltiazem (CARDIZEM) 125 mg in dextrose 5% 125 mL (1 mg/mL) infusion (has no administration in time range)     Procedures  /  Critical Care .Critical Care  Performed by: Roxy Horseman, PA-C Authorized by: Roxy Horseman, PA-C   Critical care provider statement:    Critical care time (minutes):  44   Critical care was necessary to treat or prevent imminent or life-threatening deterioration of the following conditions:  Circulatory failure   Critical care was time spent personally by me on the following activities:  Development of treatment plan with patient or surrogate, discussions with consultants, evaluation of patient's response to treatment, examination of patient, ordering and review of laboratory studies, ordering and review of radiographic studies, ordering and performing treatments and interventions, pulse oximetry, re-evaluation of patient's condition and review of old charts   ED Course and Medical Decision Making  I have reviewed the triage vital  signs, the nursing notes, and pertinent available records from the EMR.  Social Determinants Affecting Complexity of Care: Patient has no clinically significant social determinants affecting this chief complaint.. {rbsocialsolutions:27068}  ED Course: Clinical Course as of 12/09/22 0004  Thu Dec 08, 2022  4679 89 year old female who has had a history of irregular heartbeat here with feeling her heart racing.  Causes her to feel little short of breath.  Not completely clear when this might of  started.  She denies any chest pain or fever.  Getting some lab work and likely need some rate control.  Possible admission. [MB]    Clinical Course User Index [MB] Terrilee Files, MD    Medical Decision Making Amount and/or Complexity of Data Reviewed Labs: ordered. Radiology: ordered.  Risk Prescription drug management. Decision regarding hospitalization.      {rbcpddx (Optional):29772:::1} {rbabdddx (Optional):29773:s::1}  Consultants: {rbconsultants:27072}   Treatment and Plan: Patient's exam and diagnostic results are concerning for new onset a-fib.  Feel that patient will need admission to the hospital for further treatment and evaluation.  Patient discussed with attending physician, Dr. Charm Barges, who ***.  Final Clinical Impressions(s) / ED Diagnoses  No diagnosis found.  ED Discharge Orders     None         Discharge Instructions Discussed with and Provided to Patient:   Discharge Instructions   None

## 2022-12-09 NOTE — Consult Note (Signed)
Cardiology Consultation   Patient ID: Megan Molina MRN: [id number]; DOB: 10/24/1932  Admit date: 12/08/2022 Date of Consult: 12/09/2022  PCP:  Harvest Forest, MD   [facility] Providers Cardiologist: Dr. [place] }     Patient Profile:   Megan Molina is a 89 y.o. female with a hx of poor thyroidism, hyperlipidemia, and heart palpitations who is being seen 12/09/2022 for the evaluation of atrial fibrillation at the request of Dr. Loney Loh.  History of Present Illness:   Megan Molina very pleasant 89 year old female history of hypothyroidism, hyperlipidemia, osteoporosis, as well as glaucoma.  Remotely, she had seen Dr. Swaziland several years ago in April 2021 with palpitations felt to be PVCs.  She wore a Holter monitor showed normal sinus rhythm with some ectopic atrial rhythm, frequent PACs with short runs of PAT, and rare PVCs with 3 g of NSVT, longest 8 beats.  An echo Doppler study at that time was normal.  Currently her thyroid medication dose was slightly adjusted downward and her symptoms essentially resolved.  She has remained fairly active and denies chest pain or shortness of breath.  Yesterday, she noted some mild increase in shortness of breath, took her blood pressure which was elevated and noted her pulse to be irregular and rapid.  She ultimately presented to the emergency room where ECG showed atrial fibrillation with RVR.  Started on IV heparin, IV diltiazem, and afternoon was transition to diltiazem 30 mg every 6 hours.  Presently she denies chest pain or shortness of breath.   Past Medical History:  Diagnosis Date   Glaucoma    Hypercholesteremia    Hypothyroid    Osteopenia 06/2016   T score -2.4 stable from prior DEXA.    Past Surgical History:  Procedure Laterality Date   ABCESS DRAINAGE     INTESTINAL ABCESS   APPENDECTOMY     BREAST BIOPSY Right    BREAST SURGERY     BIOPSY   cataract surgery     COLONOSCOPY  2013   INGUINAL HERNIA REPAIR Bilateral  09/02/2019   Procedure: LAPAROSCOPIC BILATERAL HERNIA REPAIR, BILATERAL FEMORAL AND LEFT INGUINAL;  Surgeon: Karie Soda, MD;  Location: [facility];  Service: General;  Laterality: Bilateral;       Inpatient Medications: Scheduled Meds:  diltiazem  30 mg Oral Q6H   dorzolamide  1 drop Both Eyes BID   [START ON 12/10/2022] levothyroxine  25 mcg Oral Once per day on Sunday Tuesday Thursday Saturday   levothyroxine  50 mcg Oral Once per day on Monday Wednesday Friday   rosuvastatin  10 mg Oral Daily   Continuous Infusions:  heparin 800 Units/hr (12/09/22 1158)   PRN Meds: acetaminophen **OR** acetaminophen  Allergies:    Allergies  Allergen Reactions   Sulfa Antibiotics Rash    Social History:   Social History   Socioeconomic History   Marital status: Widowed    Spouse name: Not on file   Number of children: 3   Years of education: Not on file   Highest education level: Not on file  Occupational History   Not on file  Tobacco Use   Smoking status: Former    Current packs/day: 0.00    Types: Cigarettes    Quit date: 04/25/1988    Years since quitting: 34.6   Smokeless tobacco: Never  Vaping Use   Vaping status: Never Used  Substance and Sexual Activity   Alcohol use: Yes    Alcohol/week: 7.0 standard drinks  of alcohol    Types: 7 Standard drinks or equivalent per week   Drug use: No   Sexual activity: Not Currently    Partners: Male    Birth control/protection: Post-menopausal    Comment: 1st intercourse 89 yo-Fewer than 5 partners, answered no to all medicare screening questions  Other Topics Concern   Not on file  Social History Narrative   Not on file   Social Determinants of Health   Financial Resource Strain: Not on file  Food Insecurity: No Food Insecurity (12/09/2022)   Hunger Vital Sign    Worried About Running Out of Food in the Last Year: Never true    Ran Out of Food in the Last Year: Never true  Transportation Needs: No Transportation Needs  (12/09/2022)   PRAPARE - Administrator, Civil Service (Medical): No    Lack of Transportation (Non-Medical): No  Physical Activity: Not on file  Stress: Not on file  Social Connections: Not on file  Intimate Partner Violence: Not At Risk (12/09/2022)   Humiliation, Afraid, Rape, and Kick questionnaire    Fear of Current or Ex-Partner: No    Emotionally Abused: No    Physically Abused: No    Sexually Abused: No    Family History:    Family History  Problem Relation Age of Onset   Pancreatic cancer Mother    Colon cancer Neg Hx    Stomach cancer Neg Hx     She  is here with her son who is in the room.  ROS:  Please see the history of present illness.  Glaucoma, hypothyroidism, hyperlipidemia, osteopenia. All other ROS reviewed and negative.     Physical Exam/Data:   Vitals:   12/09/22 1126 12/09/22 1148 12/09/22 1151 12/09/22 1701  BP:   121/80 116/82  Pulse:   78 94  Resp:    20  Temp: 98.7 F (37.1 C)  98.6 F (37 C) 98.2 F (36.8 C)  TempSrc:  Tympanic Oral Oral  SpO2:    94%  Weight:      Height:        Intake/Output Summary (Last 24 hours) at 12/09/2022 1802 Last data filed at 12/09/2022 1600 Gross per 24 hour  Intake 442.95 ml  Output 300 ml  Net 142.95 ml      12/08/2022   10:17 PM 11/17/2022    9:50 AM 10/04/2021    2:44 PM  Last 3 Weights  Weight (lbs) 125 lb 125 lb 128 lb  Weight (kg) 56.7 kg 56.7 kg 58.06 kg     Body mass index is 20.8 kg/m.  General:  Well nourished, well developed, in no acute distress HEENT: normal Neck: no JVD Vascular: No carotid bruits; Distal pulses 2+ bilaterally Cardiac:  normal S1, S2; RRR; no murmur  Lungs:  clear to auscultation bilaterally, no wheezing, rhonchi or rales  Abd: soft, nontender, no hepatomegaly  Ext: no edema Musculoskeletal:  No deformities, BUE and BLE strength normal and equal Skin: warm and dry  Neuro:  CNs 2-12 intact, no focal abnormalities noted Psych:  Normal affect    12/08/2022 EKG:  The EKG was personally reviewed and demonstrates: Atrial fibrillation at 106 bpm with mild nondiagnostic ST changes V4 through V6. Telemetry:  Telemetry was personally reviewed and demonstrates: Laboratory now shows normal sinus rhythm heart rate in the 80s with occasional PACs.  Relevant CV Studies:   Echo: 02/25/2019  1. Left ventricular ejection fraction, by visual estimation, is 65 to  70%. The left ventricle has normal function. There is no left ventricular  hypertrophy.   2. Left ventricular diastolic parameters are consistent with Grade I  diastolic dysfunction (impaired relaxation).   3. Global right ventricle has normal systolic function.The right  ventricular size is normal. No increase in right ventricular wall  thickness.   4. Left atrial size was normal.   5. Right atrial size was normal.   6. Trivial pericardial effusion is present.   7. The mitral valve is normal in structure. Trace mitral valve  regurgitation.   8. The tricuspid valve is normal in structure. Tricuspid valve  regurgitation is trivial.   9. The aortic valve is normal in structure. Aortic valve regurgitation is  not visualized.  10. The pulmonic valve was not well visualized. Pulmonic valve  regurgitation is not visualized.  11. Normal pulmonary artery systolic pressure.    8-14 DAY monitor: 05/01/2019 Normal sinus rhythm with some ectopic atrial rhythm Frequent PACs with short runs of PAT. Rare PVCs. 3 runs of NSVT longest 8 beats.    ECHO: 12/09/2022  1. Left ventricular ejection fraction, by estimation, is 70 to 75%. The  left ventricle has hyperdynamic function. The left ventricle has no  regional wall motion abnormalities. Left ventricular diastolic parameters  are indeterminate.   2. Right ventricular systolic function is normal. The right ventricular  size is normal.   3. Left atrial size was moderately dilated.   4. Right atrial size was mildly dilated.   5. The  mitral valve is normal in structure. Mild mitral valve  regurgitation.   6. The aortic valve is normal in structure. Aortic valve regurgitation is  not visualized.   7. The inferior vena cava is dilated in size with <50% respiratory  variability, suggesting right atrial pressure of 15 mmHg.    Laboratory Data:  High Sensitivity Troponin:   Recent Labs  Lab 12/08/22 2222 12/09/22 0116  TROPONINIHS 5 16     Chemistry Recent Labs  Lab 12/08/22 2222 12/08/22 2251 12/09/22 0116  NA 136 137 138  K 3.6 3.6 3.9  CL 100 103 103  CO2 21*  --  24  GLUCOSE 122* 127* 106*  BUN 20 21 19   CREATININE 0.78 0.70 0.75  CALCIUM 9.3  --  8.9  MG  --   --  2.0  GFRNONAA >60  --  >60  ANIONGAP 15  --  11    No results for input(s): "PROT", "ALBUMIN", "AST", "ALT", "ALKPHOS", "BILITOT" in the last 168 hours. Lipids No results for input(s): "CHOL", "TRIG", "HDL", "LABVLDL", "LDLCALC", "CHOLHDL" in the last 168 hours.  Hematology Recent Labs  Lab 12/08/22 2222 12/08/22 2251  WBC 10.2  --   RBC 4.93  --   HGB 14.8 15.3*  HCT 46.4* 45.0  MCV 94.1  --   MCH 30.0  --   MCHC 31.9  --   RDW 13.3  --   PLT 269  --    Thyroid  Recent Labs  Lab 12/08/22 2258  TSH 1.358    BNPNo results for input(s): "BNP", "PROBNP" in the last 168 hours.  DDimer No results for input(s): "DDIMER" in the last 168 hours.   Radiology/Studies:  ECHOCARDIOGRAM COMPLETE  Result Date: 12/09/2022    ECHOCARDIOGRAM REPORT   Patient Name:   [facility] [place] Date of Exam: 12/09/2022 Medical Rec #:  [id number]  Height:       65.0 in Accession #:    [id number] Weight:  125.0 lb Date of Birth:  Feb 11, 1933   BSA:          1.620 m Patient Age:    89 years   BP:           125/81 mmHg Patient Gender: F          HR:           84 bpm. Exam Location:  Inpatient Procedure: 2D Echo, Cardiac Doppler and Color Doppler Indications:    Atrial Fibrilation I48.91  History:        Patient has prior history of Echocardiogram examinations,  most                 recent 02/25/2019. Arrythmias:LBBB.  Sonographer:    Harriette Bouillon RDCS Referring Phys: [id number] VASUNDHRA RATHORE IMPRESSIONS  1. Left ventricular ejection fraction, by estimation, is 70 to 75%. The left ventricle has hyperdynamic function. The left ventricle has no regional wall motion abnormalities. Left ventricular diastolic parameters are indeterminate.  2. Right ventricular systolic function is normal. The right ventricular size is normal.  3. Left atrial size was moderately dilated.  4. Right atrial size was mildly dilated.  5. The mitral valve is normal in structure. Mild mitral valve regurgitation.  6. The aortic valve is normal in structure. Aortic valve regurgitation is not visualized.  7. The inferior vena cava is dilated in size with <50% respiratory variability, suggesting right atrial pressure of 15 mmHg. FINDINGS  Left Ventricle: Left ventricular ejection fraction, by estimation, is 70 to 75%. The left ventricle has hyperdynamic function. The left ventricle has no regional wall motion abnormalities. The left ventricular internal cavity size was normal in size. There is no left ventricular hypertrophy. Left ventricular diastolic parameters are indeterminate. Right Ventricle: The right ventricular size is normal. Right vetricular wall thickness was not assessed. Right ventricular systolic function is normal. Left Atrium: Left atrial size was moderately dilated. Right Atrium: Right atrial size was mildly dilated. Pericardium: There is no evidence of pericardial effusion. Mitral Valve: The mitral valve is normal in structure. Mild mitral valve regurgitation. Tricuspid Valve: The tricuspid valve is normal in structure. Tricuspid valve regurgitation is trivial. Aortic Valve: The aortic valve is normal in structure. Aortic valve regurgitation is not visualized. Pulmonic Valve: The pulmonic valve was normal in structure. Pulmonic valve regurgitation is not visualized. Aorta: The aortic  root and ascending aorta are structurally normal, with no evidence of dilitation. Venous: The inferior vena cava is dilated in size with less than 50% respiratory variability, suggesting right atrial pressure of 15 mmHg. IAS/Shunts: No atrial level shunt detected by color flow Doppler.  LEFT VENTRICLE PLAX 2D LVIDd:         3.60 cm   Diastology LVIDs:         2.20 cm   LV e' lateral: 10.10 cm/s LV PW:         0.80 cm LV IVS:        0.80 cm LVOT diam:     1.80 cm LV SV:         33 LV SV Index:   21 LVOT Area:     2.54 cm  RIGHT VENTRICLE             IVC RV S prime:     11.60 cm/s  IVC diam: 2.00 cm LEFT ATRIUM             Index        RIGHT ATRIUM  Index LA diam:        3.10 cm 1.91 cm/m   RA Area:     18.20 cm LA Vol (A2C):   62.0 ml 38.27 ml/m  RA Volume:   46.40 ml  28.64 ml/m LA Vol (A4C):   64.0 ml 39.51 ml/m LA Biplane Vol: 65.7 ml 40.56 ml/m  AORTIC VALVE LVOT Vmax:   85.20 cm/s LVOT Vmean:  55.500 cm/s LVOT VTI:    0.131 m  AORTA Ao Root diam: 2.80 cm Ao Asc diam:  2.80 cm  SHUNTS Systemic VTI:  0.13 m Systemic Diam: 1.80 cm Dietrich Pates MD Electronically signed by Dietrich Pates MD Signature Date/Time: 12/09/2022/1:15:25 PM    Final    DG Chest Port 1 View  Result Date: 12/08/2022 CLINICAL DATA:  Chest pain and shortness of breath EXAM: PORTABLE CHEST 1 VIEW COMPARISON:  09/10/2019 FINDINGS: Cardiac shadow is within normal limits. Lungs are well aerated bilaterally. No focal infiltrate or sizable effusion is seen. No bony abnormality is noted. IMPRESSION: No acute abnormality noted. Electronically Signed   By: Alcide Clever M.D.   On: 12/08/2022 23:14     Assessment and Plan:   Paroxysmal atrial fibrillation: Ms. Tartaglione denies any awareness of prior atrial fibrillation but remotely has had issues with palpitations and on her monitor in 2021 was found to have PACs, short episodes of atrial tachycardia, PVCs with short bursts of NSVT.  Upon presentation yesterday, ECG shows atrial fibrillation  with RVR.  She was heparinized and is now on Cardizem orally at 30 mg every 6 hours after initially being on Cardizem drip at 10 mg/h.  Received a first dose of oral Cardizem at 3 PM and telemetry now shows that she is converted to sinus rhythm but is having occasional PACs.  She will maintain sinus rhythm.  She was not given beta-blocker therapy.  This can be given if necessary.  Otherwise can consolidate Cardizem to Cardizem CD 120 and 180 mg depending upon heart rate and blood pressure.  2D echo Doppler study today shows hyperdynamic LV function with mild MR.  Will repeat EKG today and in a.m. Anticoagulation: She currently is on IV heparin.  Will need to be transition to oral Eliquis and despite her age of 49 with normal renal function and weight dose should be 5 mg twice a day. Hypothyroidism on Synthroid: TSH is normal at 1.38 on levothyroxine 50 mcg alternating with 25 mcg Hyperlipidemia: On rosuvastatin 10 mg.  Will check fasting lipid panel in AM.   Risk Assessment/Risk Scores:  { For questions or updates, please contact [facility] Please consult [email or web address] for contact info under    Signed, Nicki Guadalajara, MD  12/09/2022 6:02 PM

## 2022-12-09 NOTE — ED Notes (Signed)
ED TO INPATIENT HANDOFF REPORT ED Nurse Name and Phone #:  Theophilus Bones [phone]  S Name/Age/Gender Megan Molina 89 y.o. female Room/Bed: 006C/006C  Code Status   Code Status: Full Code  Home/SNF/Other Home Patient oriented to: self, place, time, and situation Is this baseline? Yes   Triage Complete: Triage complete  Chief Complaint Atrial fibrillation with rapid ventricular response (HCC) [I48.91]  Triage Note Pt bib gcems from home she was outside c/o a racing heart. Denies chest pain, sob, n/v/d. Ems placed on monitor pt in afib rvr hr 90-140. Bp- 162/76 Hr- 107 96% RA   Allergies Allergies  Allergen Reactions   Sulfa Antibiotics Rash    Level of Care/Admitting Diagnosis ED Disposition     ED Disposition  Admit   Condition  --   Comment  Hospital [facility]: [facility] [[id number]]  Level of Care: Progressive [102]  Admit to Progressive based on following criteria: CARDIOVASCULAR & THORACIC of moderate stability with acute coronary syndrome symptoms/low risk myocardial infarction/hypertensive urgency/arrhythmias/heart failure potentially compromising stability and stable post cardiovascular intervention patients.  May place patient in observation at [facility] or [facility] if equivalent level of care is available:: Yes  Covid Evaluation: Asymptomatic - no recent exposure (last 10 days) testing not required  Diagnosis: Atrial fibrillation with rapid ventricular response [facility]) [[id number]]  Admitting Physician: John Giovanni [[id number]]  Attending Physician: John Giovanni [[id number]]          B Medical/Surgery History Past Medical History:  Diagnosis Date   Glaucoma    Hypercholesteremia    Hypothyroid    Osteopenia 06/2016   T score -2.4 stable from prior DEXA.   Past Surgical History:  Procedure Laterality Date   ABCESS DRAINAGE     INTESTINAL ABCESS   APPENDECTOMY     BREAST BIOPSY Right    BREAST SURGERY     BIOPSY   cataract  surgery     COLONOSCOPY  2013   INGUINAL HERNIA REPAIR Bilateral 09/02/2019   Procedure: LAPAROSCOPIC BILATERAL HERNIA REPAIR, BILATERAL FEMORAL AND LEFT INGUINAL;  Surgeon: Karie Soda, MD;  Location: [facility];  Service: General;  Laterality: Bilateral;     A IV Location/Drains/Wounds Patient Lines/Drains/Airways Status     Active Line/Drains/Airways     Name Placement date Placement time Site Days   Peripheral IV 12/08/22 18 G Anterior;Distal;Left Forearm 12/08/22  2222  Forearm  1   Peripheral IV 12/09/22 20 G Anterior;Left;Proximal Forearm 12/09/22  0216  Forearm  less than 1   Incision - 3 Ports Abdomen Right;Lateral Umbilicus Left;Lateral 09/02/19  1050  -- 1194            Intake/Output Last 24 hours  Intake/Output Summary (Last 24 hours) at 12/09/2022 1105 Last data filed at 12/09/2022 0756 Gross per 24 hour  Intake 52.41 ml  Output --  Net 52.41 ml    Labs/Imaging Results for orders placed or performed during the hospital encounter of 12/08/22 (from the past 48 hour(s))  Basic metabolic panel     Status: Abnormal   Collection Time: 12/08/22 10:22 PM  Result Value Ref Range   Sodium 136 135 - 145 mmol/L   Potassium 3.6 3.5 - 5.1 mmol/L   Chloride 100 98 - 111 mmol/L   CO2 21 (L) 22 - 32 mmol/L   Glucose, Bld 122 (H) 70 - 99 mg/dL    Comment: Glucose reference range applies only to samples taken after fasting for at least 8 hours.   BUN  20 8 - 23 mg/dL   Creatinine, Ser 7.82 0.44 - 1.00 mg/dL   Calcium 9.3 8.9 - 95.6 mg/dL   GFR, Estimated >21 >30 mL/min    Comment: (NOTE) Calculated using the CKD-EPI Creatinine Equation (2021)    Anion gap 15 5 - 15    Comment: Performed at [facility], [address] [place], [place] [address]  CBC     Status: Abnormal   Collection Time: 12/08/22 10:22 PM  Result Value Ref Range   WBC 10.2 4.0 - 10.5 K/uL   RBC 4.93 3.87 - 5.11 MIL/uL   Hemoglobin 14.8 12.0 - 15.0 g/dL   HCT 46.9 (H) 62.9 - 52.8 %   MCV 94.1  80.0 - 100.0 fL   MCH 30.0 26.0 - 34.0 pg   MCHC 31.9 30.0 - 36.0 g/dL   RDW 41.3 24.4 - 01.0 %   Platelets 269 150 - 400 K/uL   nRBC 0.0 0.0 - 0.2 %    Comment: Performed at [facility], [address], [place], [place] [address]  Troponin I (High Sensitivity)     Status: None   Collection Time: 12/08/22 10:22 PM  Result Value Ref Range   Troponin I (High Sensitivity) 5 <18 ng/L    Comment: (NOTE) Elevated high sensitivity troponin I (hsTnI) values and significant  changes across serial measurements may suggest ACS but many other  chronic and acute conditions are known to elevate hsTnI results.  Refer to the "Links" section for chest pain algorithms and additional  guidance. Performed at [facility], [address], [place], [place] [address]   SARS Coronavirus 2 by RT PCR (hospital order, performed in [facility] hospital lab) *cepheid single result test* Anterior Nasal Swab     Status: None   Collection Time: 12/08/22 10:32 PM   Specimen: Anterior Nasal Swab  Result Value Ref Range   SARS Coronavirus 2 by RT PCR NEGATIVE NEGATIVE    Comment: Performed at [facility], [address] [place], [place] [address]  I-stat chem 8, ED (not at [facility], [facility] or [facility])     Status: Abnormal   Collection Time: 12/08/22 10:51 PM  Result Value Ref Range   Sodium 137 135 - 145 mmol/L   Potassium 3.6 3.5 - 5.1 mmol/L   Chloride 103 98 - 111 mmol/L   BUN 21 8 - 23 mg/dL   Creatinine, Ser 5.95 0.44 - 1.00 mg/dL   Glucose, Bld 638 (H) 70 - 99 mg/dL    Comment: Glucose reference range applies only to samples taken after fasting for at least 8 hours.   Calcium, Ion 1.15 1.15 - 1.40 mmol/L   TCO2 21 (L) 22 - 32 mmol/L   Hemoglobin 15.3 (H) 12.0 - 15.0 g/dL   HCT 75.6 43.3 - 29.5 %  TSH     Status: None   Collection Time: 12/08/22 10:58 PM  Result Value Ref Range   TSH 1.358 0.350 - 4.500 uIU/mL    Comment: Performed by a 3rd Generation assay with a functional sensitivity of  <=0.01 uIU/mL. Performed at [facility], [address] [place], [place] [address]   Troponin I (High Sensitivity)     Status: None   Collection Time: 12/09/22  1:16 AM  Result Value Ref Range   Troponin I (High Sensitivity) 16 <18 ng/L    Comment: (NOTE) Elevated high sensitivity troponin I (hsTnI) values and significant  changes across serial  measurements may suggest ACS but many other  chronic and acute conditions are known to elevate hsTnI results.  Refer to the "Links" section for chest pain algorithms and additional  guidance. Performed at [facility], [address] [place], [place] [address]   Basic metabolic panel     Status: Abnormal   Collection Time: 12/09/22  1:16 AM  Result Value Ref Range   Sodium 138 135 - 145 mmol/L   Potassium 3.9 3.5 - 5.1 mmol/L   Chloride 103 98 - 111 mmol/L   CO2 24 22 - 32 mmol/L   Glucose, Bld 106 (H) 70 - 99 mg/dL    Comment: Glucose reference range applies only to samples taken after fasting for at least 8 hours.   BUN 19 8 - 23 mg/dL   Creatinine, Ser 6.04 0.44 - 1.00 mg/dL   Calcium 8.9 8.9 - 54.0 mg/dL   GFR, Estimated >98 >11 mL/min    Comment: (NOTE) Calculated using the CKD-EPI Creatinine Equation (2021)    Anion gap 11 5 - 15    Comment: Performed at [facility], [address] [place], [place] [address]  Magnesium     Status: None   Collection Time: 12/09/22  1:16 AM  Result Value Ref Range   Magnesium 2.0 1.7 - 2.4 mg/dL    Comment: Performed at [facility], [address] [place], [place] [address]  Heparin level (unfractionated)     Status: None   Collection Time: 12/09/22 10:28 AM  Result Value Ref Range   Heparin Unfractionated 0.69 0.30 - 0.70 IU/mL    Comment: (NOTE) The clinical reportable range upper limit is being lowered to >1.10 to align with the FDA approved guidance for the current laboratory assay.  If heparin results are below expected values, and patient dosage has   been confirmed, suggest follow up testing of antithrombin III levels. Performed at [facility], [address], [place], [place] [address]    DG Chest Port 1 View  Result Date: 12/08/2022 CLINICAL DATA:  Chest pain and shortness of breath EXAM: PORTABLE CHEST 1 VIEW COMPARISON:  09/10/2019 FINDINGS: Cardiac shadow is within normal limits. Lungs are well aerated bilaterally. No focal infiltrate or sizable effusion is seen. No bony abnormality is noted. IMPRESSION: No acute abnormality noted. Electronically Signed   By: Alcide Clever M.D.   On: 12/08/2022 23:14    Pending Labs Unresulted Labs (From admission, onward)     Start     Ordered   12/10/22 0500  CBC  Daily,   R      12/09/22 0821   12/10/22 0500  Heparin level (unfractionated)  Daily,   R      12/09/22 0821            Vitals/Pain Today's Vitals   12/09/22 0815 12/09/22 1015 12/09/22 1030 12/09/22 1100  BP: 99/64 129/82 125/81 137/68  Pulse: 60 81 79 80  Resp: 16 (!) 21 (!) 23 (!) 29  Temp:      TempSrc:      SpO2: 94% 95% 95% 94%  Weight:      Height:        Isolation Precautions No active isolations  Medications Medications  diltiazem (CARDIZEM) 1 mg/mL load via infusion 10 mg (10 mg Intravenous Bolus from Bag 12/08/22 2303)    And  diltiazem (CARDIZEM) 125 mg in dextrose 5% 125 mL (1 mg/mL) infusion (0 mg/hr Intravenous Stopped 12/09/22 0756)  heparin ADULT infusion 100 units/mL (25000 units/274mL) (850 Units/hr Intravenous New Bag/Given 12/09/22 0109)  dorzolamide (TRUSOPT) 2 % ophthalmic solution 1 drop (1 drop Both Eyes Given 12/09/22 1031)  rosuvastatin (CRESTOR) tablet 10 mg (10 mg Oral Given 12/09/22 1031)  acetaminophen (TYLENOL) tablet 650 mg (has no administration in time range)    Or  acetaminophen (TYLENOL) suppository 650 mg (has no administration in time range)  levothyroxine (SYNTHROID) tablet 25 mcg (has no administration in time range)  levothyroxine (SYNTHROID) tablet 50 mcg (50 mcg  Oral Given 12/09/22 0640)  heparin bolus via infusion 3,000 Units (3,000 Units Intravenous Bolus from Bag 12/09/22 0109)  lactated ringers bolus 500 mL (0 mLs Intravenous Stopped 12/09/22 0334)  potassium chloride SA (KLOR-CON M) CR tablet 40 mEq (40 mEq Oral Given 12/09/22 0455)    Mobility walks     Focused Assessments Cardiac Assessment Handoff:    No results found for: "CKTOTAL", "CKMB", "CKMBINDEX", "TROPONINI" No results found for: "DDIMER" Does the Patient currently have chest pain? No    R Recommendations: See Admitting Provider Note  Report given to:   Additional Notes:

## 2022-12-09 NOTE — Progress Notes (Signed)
ANTICOAGULATION CONSULT NOTE  Pharmacy Consult for heparin Indication: atrial fibrillation  Allergies  Allergen Reactions   Sulfa Antibiotics Rash    Patient Measurements: Height: 5\' 5"  (165.1 cm) Weight: 56.7 kg (125 lb) IBW/kg (Calculated) : 57 Heparin Dosing Weight: 56.7 kg  Vital Signs: Temp: 98.7 F (37.1 C) (08/16 1126) Temp Source: Oral (08/16 0628) BP: 137/68 (08/16 1100) Pulse Rate: 80 (08/16 1100)  Labs: Recent Labs    12/08/22 2222 12/08/22 2251 12/09/22 0116 12/09/22 1028  HGB 14.8 15.3*  --   --   HCT 46.4* 45.0  --   --   PLT 269  --   --   --   HEPARINUNFRC  --   --   --  0.69  CREATININE 0.78 0.70 0.75  --   TROPONINIHS 5  --  16  --     Estimated Creatinine Clearance: 43.5 mL/min (by C-G formula based on SCr of 0.75 mg/dL).   Medical History: Past Medical History:  Diagnosis Date   Glaucoma    Hypercholesteremia    Hypothyroid    Osteopenia 06/2016   T score -2.4 stable from prior DEXA.   Assessment: 48 yoF presented to ED with complaint of SOB. Pharmacy consulted to dose heparin for afib. No PTA anticoagulant, CBC stable.  Initial heparin level is therapeutic at 0.69, on upper end of range.  Goal of Therapy:  Heparin level 0.3-0.7 units/ml Monitor platelets by anticoagulation protocol: Yes   Plan:  Reduce heparin to 800 units/h Daily heparin level and CBC  Fredonia Highland, PharmD, [place], [facility] Clinical Pharmacist [phone] Please check AMION for all [facility] Pharmacy numbers 12/09/2022

## 2022-12-09 NOTE — Progress Notes (Signed)
ANTICOAGULATION CONSULT NOTE - Initial Consult  Pharmacy Consult for heparin Indication: atrial fibrillation  Allergies  Allergen Reactions   Sulfa Antibiotics Rash    Patient Measurements: Height: 5\' 5"  (165.1 cm) Weight: 56.7 kg (125 lb) IBW/kg (Calculated) : 57 Heparin Dosing Weight: 56.7 kg  Vital Signs: Temp: 99.8 F (37.7 C) (08/15 2217) Temp Source: Oral (08/15 2217) BP: 124/73 (08/15 2330) Pulse Rate: 109 (08/15 2330)  Labs: Recent Labs    12/08/22 2222 12/08/22 2251  HGB 14.8 15.3*  HCT 46.4* 45.0  PLT 269  --   CREATININE 0.78 0.70  TROPONINIHS 5  --     Estimated Creatinine Clearance: 43.5 mL/min (by C-G formula based on SCr of 0.7 mg/dL).   Medical History: Past Medical History:  Diagnosis Date   Glaucoma    Hypercholesteremia    Hypothyroid    Osteopenia 06/2016   T score -2.4 stable from prior DEXA.   Assessment: 52 yoF presented to ED with complaint of SOB. Pharmacy consulted to dose heparin for afib. No PTA anticoagulant, CBC stable.  Goal of Therapy:  Heparin level 0.3-0.7 units/ml Monitor platelets by anticoagulation protocol: Yes   Plan:  Give 3000 units bolus x 1 Start heparin infusion at 850 units/hr Check anti-Xa level in 8 hours and daily while on heparin Continue to monitor H&H and platelets  Arabella Merles, PharmD. Clinical Pharmacist 12/09/2022 12:26 AM

## 2022-12-09 NOTE — ED Notes (Signed)
ED TO INPATIENT HANDOFF REPORT  ED Nurse Name and Phone #: [phone]  S Name/Age/Gender Megan Molina 89 y.o. female Room/Bed: 032C/032C  Code Status   Code Status: Prior  Home/SNF/Other Home Patient oriented to: self, place, time, and situation Is this baseline? Yes   Triage Complete: Triage complete  Chief Complaint AFIB  Triage Note Pt bib gcems from home she was outside c/o a racing heart. Denies chest pain, sob, n/v/d. Ems placed on monitor pt in afib rvr hr 90-140. Bp- 162/76 Hr- 107 96% RA   Allergies Allergies  Allergen Reactions   Sulfa Antibiotics Rash    Level of Care/Admitting Diagnosis ED Disposition     ED Disposition  Admit   Condition  --   Comment  The patient appears reasonably stabilized for admission considering the current resources, flow, and capabilities available in the ED at this time, and I doubt any other [facility] requiring further screening and/or treatment in the ED prior to admission is  present.          B Medical/Surgery History Past Medical History:  Diagnosis Date   Glaucoma    Hypercholesteremia    Hypothyroid    Osteopenia 06/2016   T score -2.4 stable from prior DEXA.   Past Surgical History:  Procedure Laterality Date   ABCESS DRAINAGE     INTESTINAL ABCESS   APPENDECTOMY     BREAST BIOPSY Right    BREAST SURGERY     BIOPSY   cataract surgery     COLONOSCOPY  2013   INGUINAL HERNIA REPAIR Bilateral 09/02/2019   Procedure: LAPAROSCOPIC BILATERAL HERNIA REPAIR, BILATERAL FEMORAL AND LEFT INGUINAL;  Surgeon: Karie Soda, MD;  Location: [facility];  Service: General;  Laterality: Bilateral;     A IV Location/Drains/Wounds Patient Lines/Drains/Airways Status     Active Line/Drains/Airways     Name Placement date Placement time Site Days   Peripheral IV 05/15/20 Left Antecubital 05/15/20  0724  Antecubital  938   Peripheral IV 12/08/22 18 G Anterior;Distal;Left Forearm 12/08/22  2222  Forearm  1   Incision - 3 Ports  Abdomen Right;Lateral Umbilicus Left;Lateral 09/02/19  1050  -- 1194            Intake/Output Last 24 hours  Intake/Output Summary (Last 24 hours) at 12/09/2022 0002 Last data filed at 12/08/2022 2335 Gross per 24 hour  Intake 12.39 ml  Output --  Net 12.39 ml    Labs/Imaging Results for orders placed or performed during the hospital encounter of 12/08/22 (from the past 48 hour(s))  Basic metabolic panel     Status: Abnormal   Collection Time: 12/08/22 10:22 PM  Result Value Ref Range   Sodium 136 135 - 145 mmol/L   Potassium 3.6 3.5 - 5.1 mmol/L   Chloride 100 98 - 111 mmol/L   CO2 21 (L) 22 - 32 mmol/L   Glucose, Bld 122 (H) 70 - 99 mg/dL    Comment: Glucose reference range applies only to samples taken after fasting for at least 8 hours.   BUN 20 8 - 23 mg/dL   Creatinine, Ser 9.60 0.44 - 1.00 mg/dL   Calcium 9.3 8.9 - 45.4 mg/dL   GFR, Estimated >09 >81 mL/min    Comment: (NOTE) Calculated using the CKD-EPI Creatinine Equation (2021)    Anion gap 15 5 - 15    Comment: Performed at [facility], [address], [place], [place] [address]  CBC     Status:  Abnormal   Collection Time: 12/08/22 10:22 PM  Result Value Ref Range   WBC 10.2 4.0 - 10.5 K/uL   RBC 4.93 3.87 - 5.11 MIL/uL   Hemoglobin 14.8 12.0 - 15.0 g/dL   HCT 40.9 (H) 81.1 - 91.4 %   MCV 94.1 80.0 - 100.0 fL   MCH 30.0 26.0 - 34.0 pg   MCHC 31.9 30.0 - 36.0 g/dL   RDW 78.2 95.6 - 21.3 %   Platelets 269 150 - 400 K/uL   nRBC 0.0 0.0 - 0.2 %    Comment: Performed at [facility], [address] [place], [place] [address]  Troponin I (High Sensitivity)     Status: None   Collection Time: 12/08/22 10:22 PM  Result Value Ref Range   Troponin I (High Sensitivity) 5 <18 ng/L    Comment: (NOTE) Elevated high sensitivity troponin I (hsTnI) values and significant  changes across serial measurements may suggest ACS but many other  chronic and acute conditions are known to elevate hsTnI results.   Refer to the "Links" section for chest pain algorithms and additional  guidance. Performed at [facility], [address], [place], [place] [address]   I-stat chem 8, ED (not at [facility], [facility] or [facility])     Status: Abnormal   Collection Time: 12/08/22 10:51 PM  Result Value Ref Range   Sodium 137 135 - 145 mmol/L   Potassium 3.6 3.5 - 5.1 mmol/L   Chloride 103 98 - 111 mmol/L   BUN 21 8 - 23 mg/dL   Creatinine, Ser 2.95 0.44 - 1.00 mg/dL   Glucose, Bld 284 (H) 70 - 99 mg/dL    Comment: Glucose reference range applies only to samples taken after fasting for at least 8 hours.   Calcium, Ion 1.15 1.15 - 1.40 mmol/L   TCO2 21 (L) 22 - 32 mmol/L   Hemoglobin 15.3 (H) 12.0 - 15.0 g/dL   HCT 13.2 44.0 - 10.2 %  TSH     Status: None   Collection Time: 12/08/22 10:58 PM  Result Value Ref Range   TSH 1.358 0.350 - 4.500 uIU/mL    Comment: Performed by a 3rd Generation assay with a functional sensitivity of <=0.01 uIU/mL. Performed at [facility], [address], [place], [place] [address]    DG Chest Port 1 View  Result Date: 12/08/2022 CLINICAL DATA:  Chest pain and shortness of breath EXAM: PORTABLE CHEST 1 VIEW COMPARISON:  09/10/2019 FINDINGS: Cardiac shadow is within normal limits. Lungs are well aerated bilaterally. No focal infiltrate or sizable effusion is seen. No bony abnormality is noted. IMPRESSION: No acute abnormality noted. Electronically Signed   By: Alcide Clever M.D.   On: 12/08/2022 23:14    Pending Labs Unresulted Labs (From admission, onward)     Start     Ordered   12/08/22 2232  SARS Coronavirus 2 by RT PCR (hospital order, performed in [facility] hospital lab) *cepheid single result test* Anterior Nasal Swab  Once,   URGENT        12/08/22 2231            Vitals/Pain Today's Vitals   12/08/22 2230 12/08/22 2245 12/08/22 2300 12/08/22 2330  BP: (!) 164/81 (!) 169/136 (!) 186/89 124/73  Pulse: (!) 116 (!) 146  (!) 109  Resp: (!) 24 (!) 22 (!) 27  20  Temp:      TempSrc:      SpO2: 94%  98%  95%  Weight:      Height:        Isolation Precautions No active isolations  Medications Medications  diltiazem (CARDIZEM) 1 mg/mL load via infusion 10 mg (10 mg Intravenous Bolus from Bag 12/08/22 2303)    And  diltiazem (CARDIZEM) 125 mg in dextrose 5% 125 mL (1 mg/mL) infusion (7.5 mg/hr Intravenous Infusion Verify 12/08/22 2335)    Mobility walks     Focused Assessments Cardiac Assessment Handoff:    No results found for: "CKTOTAL", "CKMB", "CKMBINDEX", "TROPONINI" No results found for: "DDIMER" Does the Patient currently have chest pain? No    R Recommendations: See Admitting Provider Note  Report given to:   Additional Notes: n/a

## 2022-12-09 NOTE — TOC Benefit Eligibility Note (Signed)
Pharmacy Patient Advocate Encounter  Insurance verification completed.    The patient is insured through [place]. Patient has Medicare and is not eligible for a copay card, but may be able to apply for patient assistance, if available.    Ran test claim for Eliquis and the current 30 day co-pay is $40.00, pt has met deductible.   This test claim was processed through [facility]- copay amounts may vary at other pharmacies due to pharmacy/plan contracts, or as the patient moves through the different stages of their insurance plan.

## 2022-12-09 NOTE — H&P (Signed)
History and Physical    Megan Molina ZDG:[id number] DOB: 1932-07-27 DOA: 12/08/2022  PCP: Harvest Forest, MD  Patient coming from: Home  Chief Complaint: Palpitations  HPI: Megan Molina is a 89 y.o. female with medical history significant of glaucoma, hyperlipidemia, hypothyroidism, osteoporosis presented to the ED via EMS with complaint of heart palpitations, shortness breath, and jaw pain.  Found to be in new onset A-fib with RVR with rate in the 140s.  Afebrile.  Labs showing no leukocytosis, potassium 3.6, initial troponin negative and repeat pending, SARS-CoV-2 PCR negative, TSH normal.  Chest x-ray negative for acute finding.  Patient was started on Cardizem drip and heparin drip.  She was given 500 cc LR.  TRH called to admit.  Patient states yesterday she felt short of breath and checked her blood pressure which was high in the 180s.  She also felt that her pulse was rapid.  States last week she had right-sided jaw pain for about 4 days and saw her doctor who did an EKG and labs and she was told that these were not consistent with heart attack.  States her doctor was concerned that the jaw pain might be related to a bone problem as she is on Boniva and had an x-ray done which came back normal.  She is no longer having any jaw pain now and denies chest pain.  Denies fevers, cough, nausea, vomiting, abdominal pain, or diarrhea.  Denies history of blood clots.  Review of Systems:  Review of Systems  All other systems reviewed and are negative.   Past Medical History:  Diagnosis Date   Glaucoma    Hypercholesteremia    Hypothyroid    Osteopenia 06/2016   T score -2.4 stable from prior DEXA.    Past Surgical History:  Procedure Laterality Date   ABCESS DRAINAGE     INTESTINAL ABCESS   APPENDECTOMY     BREAST BIOPSY Right    BREAST SURGERY     BIOPSY   cataract surgery     COLONOSCOPY  2013   INGUINAL HERNIA REPAIR Bilateral 09/02/2019   Procedure: LAPAROSCOPIC BILATERAL HERNIA  REPAIR, BILATERAL FEMORAL AND LEFT INGUINAL;  Surgeon: Karie Soda, MD;  Location: [facility];  Service: General;  Laterality: Bilateral;     reports that she quit smoking about 34 years ago. Her smoking use included cigarettes. She has never used smokeless tobacco. She reports current alcohol use of about 7.0 standard drinks of alcohol per week. She reports that she does not use drugs.  Allergies  Allergen Reactions   Sulfa Antibiotics Rash    Family History  Problem Relation Age of Onset   Pancreatic cancer Mother    Colon cancer Neg Hx    Stomach cancer Neg Hx     Prior to Admission medications   Medication Sig Start Date End Date Taking? Authorizing Provider  Ascorbic Acid (VITAMIN C PO) Take 1 tablet by mouth every 3 (three) days.   Yes [provider]  aspirin 81 MG tablet Take 81 mg by mouth every other day.    Yes [provider]  Calcium Carb-Cholecalciferol (CALCIUM 500 + D3 PO) Take 500 mg by mouth.   Yes [provider]  Cholecalciferol (VITAMIN D PO) Take 2,000 Units by mouth.   Yes [provider]  Cyanocobalamin (VITAMIN B-12 PO) Take 1 tablet by mouth every 3 (three) days.   Yes [provider]  dorzolamide (TRUSOPT) 2 % ophthalmic solution Place 1 drop into  both eyes 2 (two) times daily.    Yes [provider]  levothyroxine (SYNTHROID) 25 MCG tablet Take 25 mcg by mouth every other day. T,TH,SAT,SUN 06/07/19  Yes [provider]  levothyroxine (SYNTHROID) 50 MCG tablet Take 50 mcg by mouth every other day. M,W,F   Yes [provider]  OMEGA-3 FATTY ACIDS PO Take 1 tablet by mouth every 3 (three) days.   Yes [provider]  rosuvastatin (CRESTOR) 10 MG tablet Take by mouth. 04/08/21  Yes [provider]  tretinoin (RETIN-A) 0.05 % cream Apply topically at bedtime. 06/14/22  Yes [provider]  clobetasol (OLUX) 0.05 % topical foam SMARTSIG:1 Sparingly Topical Daily Patient  not taking: Reported on 11/17/2022 06/09/22   [provider]  ibandronate (BONIVA) 150 MG tablet TAKE 1 TABLET BY MOUTH EVERY 30 DAYS IN THE MORNING, WITH FULL GLASS OF WATER ON AN EMPTY STOMACH. NOTHING BY MOUTH OR LIE DOWN FOR NEXT 30 Patient not taking: Reported on 12/09/2022 11/17/22   Tanda Rockers, NP    Physical Exam: Vitals:   12/09/22 0030 12/09/22 0045 12/09/22 0200 12/09/22 0226  BP: 125/62 101/64 (!) 103/54   Pulse: 99 81 80   Resp: (!) 24 (!) 25 20   Temp:    98 F (36.7 C)  TempSrc:    Oral  SpO2: 93% 91% 93%   Weight:      Height:        Physical Exam Vitals reviewed.  Constitutional:      General: She is not in acute distress. HENT:     Head: Normocephalic and atraumatic.  Eyes:     Extraocular Movements: Extraocular movements intact.  Cardiovascular:     Rate and Rhythm: Normal rate. Rhythm irregular.     Pulses: Normal pulses.  Pulmonary:     Effort: Pulmonary effort is normal. No respiratory distress.     Breath sounds: Normal breath sounds. No wheezing or rales.  Abdominal:     General: Bowel sounds are normal. There is no distension.     Palpations: Abdomen is soft.     Tenderness: There is no abdominal tenderness.  Musculoskeletal:     Cervical back: Normal range of motion.     Right lower leg: No edema.     Left lower leg: No edema.  Skin:    General: Skin is warm and dry.  Neurological:     General: No focal deficit present.     Mental Status: She is alert and oriented to person, place, and time.     Labs on Admission: I have personally reviewed following labs and imaging studies  CBC: Recent Labs  Lab 12/08/22 2222 12/08/22 2251  WBC 10.2  --   HGB 14.8 15.3*  HCT 46.4* 45.0  MCV 94.1  --   PLT 269  --    Basic Metabolic Panel: Recent Labs  Lab 12/08/22 2222 12/08/22 2251  NA 136 137  K 3.6 3.6  CL 100 103  CO2 21*  --   GLUCOSE 122* 127*  BUN 20 21  CREATININE 0.78 0.70  CALCIUM 9.3  --     GFR: Estimated Creatinine Clearance: 43.5 mL/min (by C-G formula based on SCr of 0.7 mg/dL). Liver Function Tests: No results for input(s): "AST", "ALT", "ALKPHOS", "BILITOT", "PROT", "ALBUMIN" in the last 168 hours. No results for input(s): "LIPASE", "AMYLASE" in the last 168 hours. No results for input(s): "AMMONIA" in the last 168 hours. Coagulation Profile: No results for  input(s): "INR", "PROTIME" in the last 168 hours. Cardiac Enzymes: No results for input(s): "CKTOTAL", "CKMB", "CKMBINDEX", "TROPONINI" in the last 168 hours. BNP (last 3 results) No results for input(s): "PROBNP" in the last 8760 hours. HbA1C: No results for input(s): "HGBA1C" in the last 72 hours. CBG: No results for input(s): "GLUCAP" in the last 168 hours. Lipid Profile: No results for input(s): "CHOL", "HDL", "LDLCALC", "TRIG", "CHOLHDL", "LDLDIRECT" in the last 72 hours. Thyroid Function Tests: Recent Labs    12/08/22 2258  TSH 1.358   Anemia Panel: No results for input(s): "VITAMINB12", "FOLATE", "FERRITIN", "TIBC", "IRON", "RETICCTPCT" in the last 72 hours. Urine analysis:    Component Value Date/Time   COLORURINE YELLOW 05/15/2020 0647   APPEARANCEUR CLEAR 05/15/2020 0647   LABSPEC 1.017 05/15/2020 0647   PHURINE 6.0 05/15/2020 0647   GLUCOSEU NEGATIVE 05/15/2020 0647   HGBUR NEGATIVE 05/15/2020 0647   BILIRUBINUR NEGATIVE 05/15/2020 0647   KETONESUR NEGATIVE 05/15/2020 0647   PROTEINUR NEGATIVE 05/15/2020 0647   NITRITE NEGATIVE 05/15/2020 0647   LEUKOCYTESUR NEGATIVE 05/15/2020 0647    Radiological Exams on Admission: DG Chest Port 1 View  Result Date: 12/08/2022 CLINICAL DATA:  Chest pain and shortness of breath EXAM: PORTABLE CHEST 1 VIEW COMPARISON:  09/10/2019 FINDINGS: Cardiac shadow is within normal limits. Lungs are well aerated bilaterally. No focal infiltrate or sizable effusion is seen. No bony abnormality is noted. IMPRESSION: No acute abnormality noted. Electronically  Signed   By: Alcide Clever M.D.   On: 12/08/2022 23:14    EKG: Independently reviewed.  A-fib with RVR.  Assessment and Plan  New onset A-fib with RVR No obvious precipitating factor.  No infectious signs or symptoms.  No hypoxia or chest pain to suggest PE.  TSH normal.  Rate initially in the 140s, now improved to 80s with Cardizem drip but remains in A-fib.  CHA2DS2-VASc at least 3 based on age and gender.  Continue IV Cardizem and IV heparin.  Echocardiogram ordered.  Keep K>4 and Mag >2, monitor electrolytes.   Hyperlipidemia Continue Crestor.  Hypothyroidism TSH normal.  Continue Synthroid.  DVT prophylaxis: IV heparin gtt Code Status: Full Code (discussed with the patient) Family Communication: No family available at this time. Level of care: Progressive Care Unit Admission status: It is my clinical opinion that referral for OBSERVATION is reasonable and necessary in this patient based on the above information provided. The aforementioned taken together are felt to place the patient at high risk for further clinical deterioration. However, it is anticipated that the patient may be medically stable for discharge from the hospital within 24 to 48 hours.   John Giovanni MD [facility]  If 7PM-7AM, please contact night-coverage [email or web address]  12/09/2022, 3:29 AM

## 2022-12-09 NOTE — Progress Notes (Signed)
Brief note: -Patient was admitted earlier today. -As per H&P done on admission: "Megan Molina is a 89 y.o. female with medical history significant of glaucoma, hyperlipidemia, hypothyroidism, osteoporosis presented to the ED via EMS with complaint of heart palpitations, shortness breath, and jaw pain.  Found to be in new onset A-fib with RVR with rate in the 140s.  Afebrile.  Labs showing no leukocytosis, potassium 3.6, initial troponin negative and repeat pending, SARS-CoV-2 PCR negative, TSH normal.  Chest x-ray negative for acute finding.  Patient was started on Cardizem drip and heparin drip.  She was given 500 cc LR.  TRH called to admit.   Patient states yesterday she felt short of breath and checked her blood pressure which was high in the 180s.  She also felt that her pulse was rapid.  States last week she had right-sided jaw pain for about 4 days and saw her doctor who did an EKG and labs and she was told that these were not consistent with heart attack.  States her doctor was concerned that the jaw pain might be related to a bone problem as she is on Boniva and had an x-ray done which came back normal.  She is no longer having any jaw pain now and denies chest pain.  Denies fevers, cough, nausea, vomiting, abdominal pain, or diarrhea.  Denies history of blood clots".  12/09/2022: Cardizem drip has been discontinued.  Apparently, heart rate dropped to 48.  Patient is on Cardizem 30 Mg p.o. every 6 hourly.  Patient is on heparin drip.  Heart rate is currently controlled.  Cardiology team has been consulted.  Further management as per cardiology team.

## 2022-12-10 ENCOUNTER — Other Ambulatory Visit (HOSPITAL_COMMUNITY): Payer: Self-pay

## 2022-12-10 DIAGNOSIS — Z87891 Personal history of nicotine dependence: Secondary | ICD-10-CM | POA: Diagnosis not present

## 2022-12-10 DIAGNOSIS — I4891 Unspecified atrial fibrillation: Secondary | ICD-10-CM | POA: Diagnosis not present

## 2022-12-10 DIAGNOSIS — E039 Hypothyroidism, unspecified: Secondary | ICD-10-CM | POA: Diagnosis not present

## 2022-12-10 DIAGNOSIS — Z7982 Long term (current) use of aspirin: Secondary | ICD-10-CM | POA: Diagnosis not present

## 2022-12-10 LAB — MAGNESIUM: Magnesium: 2 mg/dL (ref 1.7–2.4)

## 2022-12-10 LAB — CBC
HCT: 39.6 % (ref 36.0–46.0)
Hemoglobin: 12.9 g/dL (ref 12.0–15.0)
MCH: 30.6 pg (ref 26.0–34.0)
MCHC: 32.6 g/dL (ref 30.0–36.0)
MCV: 93.8 fL (ref 80.0–100.0)
Platelets: 218 10*3/uL (ref 150–400)
RBC: 4.22 MIL/uL (ref 3.87–5.11)
RDW: 13.8 % (ref 11.5–15.5)
WBC: 8.4 10*3/uL (ref 4.0–10.5)
nRBC: 0 % (ref 0.0–0.2)

## 2022-12-10 LAB — LIPID PANEL
Cholesterol: 139 mg/dL (ref 0–200)
HDL: 69 mg/dL (ref >40)
LDL Cholesterol: 62 mg/dL (ref 0–99)
Total CHOL/HDL Ratio: 2 ratio
Triglycerides: 40 mg/dL (ref <150)
VLDL: 8 mg/dL (ref 0–40)

## 2022-12-10 LAB — HEPARIN LEVEL (UNFRACTIONATED): Heparin Unfractionated: 0.59 [IU]/mL (ref 0.30–0.70)

## 2022-12-10 MED ORDER — APIXABAN 2.5 MG PO TABS
2.5000 mg | ORAL_TABLET | Freq: Two times a day (BID) | ORAL | Status: DC
  Administered 2022-12-10: 2.5 mg via ORAL
  Filled 2022-12-10: qty 1

## 2022-12-10 MED ORDER — DILTIAZEM HCL ER COATED BEADS 120 MG PO CP24
120.0000 mg | ORAL_CAPSULE | Freq: Every day | ORAL | Status: DC
  Administered 2022-12-10: 120 mg via ORAL
  Filled 2022-12-10: qty 1

## 2022-12-10 MED ORDER — APIXABAN 2.5 MG PO TABS
2.5000 mg | ORAL_TABLET | Freq: Two times a day (BID) | ORAL | 3 refills | Status: DC
Start: 2022-12-10 — End: 2022-12-21
  Filled 2022-12-10: qty 60, 30d supply, fill #0

## 2022-12-10 MED ORDER — APIXABAN 5 MG PO TABS
5.0000 mg | ORAL_TABLET | Freq: Two times a day (BID) | ORAL | Status: DC
Start: 2022-12-10 — End: 2022-12-10

## 2022-12-10 MED ORDER — DILTIAZEM HCL ER COATED BEADS 120 MG PO CP24
120.0000 mg | ORAL_CAPSULE | Freq: Every day | ORAL | 3 refills | Status: DC
  Filled 2022-12-10: qty 30, 30d supply, fill #0

## 2022-12-10 NOTE — Plan of Care (Signed)
Problem: Education: Goal: Knowledge of disease or condition will improve 12/10/2022 1146 by Mayra Neer, RN Outcome: Adequate for Discharge 12/10/2022 [phone] by Mayra Neer, RN Outcome: Progressing Goal: Understanding of medication regimen will improve 12/10/2022 1146 by Mayra Neer, RN Outcome: Adequate for Discharge 12/10/2022 0917 by Mayra Neer, RN Outcome: Progressing Goal: Individualized Educational Video(s) 12/10/2022 1146 by Mayra Neer, RN Outcome: Adequate for Discharge 12/10/2022 [phone] by Mayra Neer, RN Outcome: Progressing   Problem: Activity: Goal: Ability to tolerate increased activity will improve 12/10/2022 1146 by Mayra Neer, RN Outcome: Adequate for Discharge 12/10/2022 [phone] by Mayra Neer, RN Outcome: Progressing   Problem: Cardiac: Goal: Ability to achieve and maintain adequate cardiopulmonary perfusion will improve 12/10/2022 1146 by Mayra Neer, RN Outcome: Adequate for Discharge 12/10/2022 0917 by Mayra Neer, RN Outcome: Progressing   Problem: Health Behavior/Discharge Planning: Goal: Ability to safely manage health-related needs after discharge will improve 12/10/2022 1146 by Mayra Neer, RN Outcome: Adequate for Discharge 12/10/2022 0917 by Mayra Neer, RN Outcome: Progressing   Problem: Education: Goal: Knowledge of General Education information will improve Description: Including pain rating scale, medication(s)/side effects and non-pharmacologic comfort measures 12/10/2022 1146 by Mayra Neer, RN Outcome: Adequate for Discharge 12/10/2022 [phone] by Mayra Neer, RN Outcome: Progressing   Problem: Health Behavior/Discharge Planning: Goal: Ability to manage health-related needs will improve 12/10/2022 1146 by Mayra Neer, RN Outcome: Adequate for Discharge 12/10/2022 [phone] by Mayra Neer, RN Outcome: Progressing   Problem: Clinical Measurements: Goal: Ability  to maintain clinical measurements within normal limits will improve 12/10/2022 1146 by Mayra Neer, RN Outcome: Adequate for Discharge 12/10/2022 [phone] by Mayra Neer, RN Outcome: Progressing Goal: Will remain free from infection 12/10/2022 1146 by Mayra Neer, RN Outcome: Adequate for Discharge 12/10/2022 [phone] by Mayra Neer, RN Outcome: Progressing Goal: Diagnostic test results will improve 12/10/2022 1146 by Mayra Neer, RN Outcome: Adequate for Discharge 12/10/2022 [phone] by Mayra Neer, RN Outcome: Progressing Goal: Respiratory complications will improve 12/10/2022 1146 by Mayra Neer, RN Outcome: Adequate for Discharge 12/10/2022 [phone] by Mayra Neer, RN Outcome: Progressing Goal: Cardiovascular complication will be avoided 12/10/2022 1146 by Mayra Neer, RN Outcome: Adequate for Discharge 12/10/2022 [phone] by Mayra Neer, RN Outcome: Progressing   Problem: Activity: Goal: Risk for activity intolerance will decrease 12/10/2022 1146 by Mayra Neer, RN Outcome: Adequate for Discharge 12/10/2022 [phone] by Mayra Neer, RN Outcome: Progressing   Problem: Nutrition: Goal: Adequate nutrition will be maintained 12/10/2022 1146 by Mayra Neer, RN Outcome: Adequate for Discharge 12/10/2022 [phone] by Mayra Neer, RN Outcome: Progressing   Problem: Coping: Goal: Level of anxiety will decrease 12/10/2022 1146 by Mayra Neer, RN Outcome: Adequate for Discharge 12/10/2022 [phone] by Mayra Neer, RN Outcome: Progressing   Problem: Elimination: Goal: Will not experience complications related to bowel motility 12/10/2022 1146 by Mayra Neer, RN Outcome: Adequate for Discharge 12/10/2022 [phone] by Mayra Neer, RN Outcome: Progressing Goal: Will not experience complications related to urinary retention 12/10/2022 1146 by Mayra Neer, RN Outcome: Adequate for Discharge 12/10/2022 [phone] by Mayra Neer, RN Outcome: Progressing   Problem: Pain Managment: Goal: General experience of comfort will improve 12/10/2022 1146 by Mayra Neer, RN Outcome: Adequate for Discharge 12/10/2022 [phone] by Mayra Neer, RN Outcome: Progressing   Problem: Safety: Goal: Ability to remain free from injury will  improve 12/10/2022 1146 by Mayra Neer, RN Outcome: Adequate for Discharge 12/10/2022 [phone] by Mayra Neer, RN Outcome: Progressing   Problem: Skin Integrity: Goal: Risk for impaired skin integrity will decrease 12/10/2022 1146 by Mayra Neer, RN Outcome: Adequate for Discharge 12/10/2022 [phone] by Mayra Neer, RN Outcome: Progressing

## 2022-12-10 NOTE — Care Management Obs Status (Signed)
MEDICARE OBSERVATION STATUS NOTIFICATION   Patient Details  Name: Megan Molina MRN: [id number] Date of Birth: August 19, 1932   Medicare Observation Status Notification Given:  Yes    Ronny Bacon, RN 12/10/2022, 7:56 AM

## 2022-12-10 NOTE — Plan of Care (Signed)
  Problem: Education: Goal: Knowledge of General Education information will improve Description: Including pain rating scale, medication(s)/side effects and non-pharmacologic comfort measures Outcome: Progressing   Problem: Health Behavior/Discharge Planning: Goal: Ability to manage health-related needs will improve Outcome: Progressing   Problem: Clinical Measurements: Goal: Cardiovascular complication will be avoided Outcome: Progressing   Problem: Safety: Goal: Ability to remain free from injury will improve Outcome: Progressing   Problem: Skin Integrity: Goal: Risk for impaired skin integrity will decrease Outcome: Progressing   

## 2022-12-10 NOTE — TOC Initial Note (Signed)
Transition of Care [facility]) - Initial/Assessment Note    Patient Details  Name: Megan Molina MRN: [id number] Date of Birth: 17-Jan-1933  Transition of Care [facility]) CM/SW Contact:    Ronny Bacon, RN Phone Number: 12/10/2022, 8:21 AM  Clinical Narrative:  Patient from independent living facility [facility]). Patient does not have DME equipment in the home. Son lives in [place] and will be visiting 12/10/2022- 12/11/2022. Son can transport patient home if she is discharged before he returns to [place]. Patient is ok with taking taxi home if son unavailable at discharge, will need taxi voucher.                 Expected Discharge Plan: Home/Self Care Barriers to Discharge: Continued Medical Work up   Patient Goals and CMS Choice Patient states their goals for this hospitalization and ongoing recovery are:: to go home          Expected Discharge Plan and Services       Living arrangements for the past 2 months: Independent Living Facility ([facility])                                      Prior Living Arrangements/Services Living arrangements for the past 2 months: Independent Living Facility ([facility]) Lives with:: Self Patient language and need for interpreter reviewed:: Yes Do you feel safe going back to the place where you live?: Yes      Need for Family Participation in Patient Care: No (Comment) Care giver support system in place?: Yes (comment)   Criminal Activity/Legal Involvement Pertinent to Current Situation/Hospitalization: No - Comment as needed  Activities of Daily Living      Permission Sought/Granted                  Emotional Assessment Appearance:: Appears stated age Attitude/Demeanor/Rapport: Engaged Affect (typically observed): Appropriate Orientation: : Oriented to Self, Oriented to Place, Oriented to  Time, Oriented to Situation Alcohol / Substance Use: Not Applicable Psych Involvement: No (comment)  Admission  diagnosis:  Atrial fibrillation with rapid ventricular response (HCC) [I48.91] Atrial fibrillation with RVR (HCC) [I48.91] Patient Active Problem List   Diagnosis Date Noted   Atrial fibrillation with rapid ventricular response (HCC) 12/09/2022   Hyperlipidemia 12/09/2022   Hypothyroidism 12/09/2022   Incarcerated femoral hernia 09/02/2019   Femoral hernia of right side with obstruction 09/01/2019   PCP:  Harvest Forest, MD Pharmacy:   [facility] [address], [place] - [address] [place] [address] Phone: [phone] Fax: [phone]  [facility] - [place], [place] - [address] AT [facility] [address] [place] [address] Phone: [phone] Fax: [phone]  [id number] [place], [place] - [address] AT [place] [address] [facility] [address] [place] [address] Phone: [phone] Fax: [phone]     Social Determinants of Health (SDOH) Social History: SDOH Screenings   Food Insecurity: No Food Insecurity (12/09/2022)  Housing: Low Risk  (12/09/2022)  Transportation Needs: No Transportation Needs (12/09/2022)  Utilities: Not At Risk (12/09/2022)  Tobacco Use: Medium Risk (11/17/2022)   SDOH Interventions:     Readmission Risk Interventions     No data to display

## 2022-12-14 NOTE — Discharge Summary (Signed)
Physician Discharge Summary   Patient: Megan Molina MRN: [id number] DOB: November 23, 1932  Admit date:     12/08/2022  Discharge date: 12/10/2022  Discharge Physician: Charolotte Eke   PCP: Harvest Forest, MD   Recommendations at discharge:    Follow up with cardiology  Discharge Diagnoses: Principal Problem:   Atrial fibrillation with rapid ventricular response (HCC) Active Problems:   Hyperlipidemia   Hypothyroidism  Resolved Problems:   * No resolved hospital problems. [facility] Course:  Paroxysmal atrial fibrillation: [name] denies any awareness of prior atrial fibrillation but remotely has had issues with palpitations and on her monitor in 2021 was found to have PACs, short episodes of atrial tachycardia, PVCs with short bursts of NSVT.  Upon presentation yesterday, ECG shows atrial fibrillation with RVR.  She was heparinized and is now on Cardizem orally at 30 mg every 6 hours after initially being on Cardizem drip at 10 mg/h.  Received a first dose of oral Cardizem at 3 PM and telemetry now shows that she is converted to sinus rhythm but is having occasional PACs.  She maintain sinus rhythms. Consolidated to Cardizem to 120 and started reduced dose eliquis 2.5 mg BID.    Assessment and Plan:  New onset A-fib with RVR As above, no clear trigger. New onset, converted to NSR prior to DC.Marland Kitchen Discharged on cardizem and eliquis.     Hyperlipidemia Continue Crestor.   Hypothyroidism TSH normal.  Continue Synthroid.        Consultants: Cardiology Procedures performed: None Disposition: Home Diet recommendation:  Discharge Diet Orders (From admission, onward)     Start     Ordered   12/10/22 0000  Diet - low sodium heart healthy        12/10/22 1133           Regular diet DISCHARGE MEDICATION: Allergies as of 12/10/2022       Reactions   Sulfa Antibiotics Rash        Medication List     STOP taking these medications    aspirin 81 MG tablet        TAKE these medications    CALCIUM 500 + D3 PO Take 500 mg by mouth.   clobetasol 0.05 % topical foam Commonly known as: OLUX SMARTSIG:1 Sparingly Topical Daily   diltiazem 120 MG 24 hr capsule Commonly known as: CARDIZEM CD Take 1 capsule (120 mg total) by mouth daily.   dorzolamide 2 % ophthalmic solution Commonly known as: TRUSOPT Place 1 drop into both eyes 2 (two) times daily.   Eliquis 2.5 MG Tabs tablet Generic drug: apixaban Take 1 tablet (2.5 mg total) by mouth 2 (two) times daily.   ibandronate 150 MG tablet Commonly known as: BONIVA TAKE 1 TABLET BY MOUTH EVERY 30 DAYS IN THE MORNING, WITH FULL GLASS OF WATER ON AN EMPTY STOMACH. NOTHING BY MOUTH OR LIE DOWN FOR NEXT 30   levothyroxine 50 MCG tablet Commonly known as: SYNTHROID Take 50 mcg by mouth every other day. M,W,F   levothyroxine 25 MCG tablet Commonly known as: SYNTHROID Take 25 mcg by mouth every other day. T,TH,SAT,SUN   OMEGA-3 FATTY ACIDS PO Take 1 tablet by mouth every 3 (three) days.   rosuvastatin 10 MG tablet Commonly known as: CRESTOR Take by mouth.   tretinoin 0.05 % cream Commonly known as: RETIN-A Apply topically at bedtime.   VITAMIN B-12 PO Take 1 tablet by mouth every 3 (three) days.   VITAMIN C  PO Take 1 tablet by mouth every 3 (three) days.   VITAMIN D PO Take 2,000 Units by mouth.        Discharge Exam: Filed Weights   12/08/22 2217  Weight: 56.7 kg   Physical Exam Vitals and nursing note reviewed.  Constitutional:      Appearance: Normal appearance. She is not ill-appearing.  HENT:     Head: Normocephalic and atraumatic.     Mouth/Throat:     Mouth: Mucous membranes are moist.  Cardiovascular:     Rate and Rhythm: Normal rate and regular rhythm.  Abdominal:     General: Abdomen is flat. There is no distension.     Palpations: Abdomen is soft. There is no mass.     Tenderness: There is no abdominal tenderness.  Musculoskeletal:     Right lower leg: No  edema.     Left lower leg: No edema.  Skin:    General: Skin is warm and dry.     Capillary Refill: Capillary refill takes less than 2 seconds.  Neurological:     Mental Status: She is alert.  Psychiatric:        Mood and Affect: Mood normal.        Behavior: Behavior normal.      Condition at discharge: good  The results of significant diagnostics from this hospitalization (including imaging, microbiology, ancillary and laboratory) are listed below for reference.   Imaging Studies: ECHOCARDIOGRAM COMPLETE  Result Date: 12/09/2022    ECHOCARDIOGRAM REPORT   Patient Name:   [facility] A [place] Date of Exam: 12/09/2022 Medical Rec #:  [id number]  Height:       65.0 in Accession #:    [id number] Weight:       125.0 lb Date of Birth:  31-Oct-1932   BSA:          1.620 m Patient Age:    89 years   BP:           125/81 mmHg Patient Gender: F          HR:           84 bpm. Exam Location:  Inpatient Procedure: 2D Echo, Cardiac Doppler and Color Doppler Indications:    Atrial Fibrilation I48.91  History:        Patient has prior history of Echocardiogram examinations, most                 recent 02/25/2019. Arrythmias:LBBB.  Sonographer:    Harriette Bouillon RDCS Referring Phys: [id number] VASUNDHRA RATHORE IMPRESSIONS  1. Left ventricular ejection fraction, by estimation, is 70 to 75%. The left ventricle has hyperdynamic function. The left ventricle has no regional wall motion abnormalities. Left ventricular diastolic parameters are indeterminate.  2. Right ventricular systolic function is normal. The right ventricular size is normal.  3. Left atrial size was moderately dilated.  4. Right atrial size was mildly dilated.  5. The mitral valve is normal in structure. Mild mitral valve regurgitation.  6. The aortic valve is normal in structure. Aortic valve regurgitation is not visualized.  7. The inferior vena cava is dilated in size with <50% respiratory variability, suggesting right atrial pressure of 15 mmHg. FINDINGS  Left  Ventricle: Left ventricular ejection fraction, by estimation, is 70 to 75%. The left ventricle has hyperdynamic function. The left ventricle has no regional wall motion abnormalities. The left ventricular internal cavity size was normal in size. There is no left ventricular hypertrophy. Left ventricular diastolic parameters  are indeterminate. Right Ventricle: The right ventricular size is normal. Right vetricular wall thickness was not assessed. Right ventricular systolic function is normal. Left Atrium: Left atrial size was moderately dilated. Right Atrium: Right atrial size was mildly dilated. Pericardium: There is no evidence of pericardial effusion. Mitral Valve: The mitral valve is normal in structure. Mild mitral valve regurgitation. Tricuspid Valve: The tricuspid valve is normal in structure. Tricuspid valve regurgitation is trivial. Aortic Valve: The aortic valve is normal in structure. Aortic valve regurgitation is not visualized. Pulmonic Valve: The pulmonic valve was normal in structure. Pulmonic valve regurgitation is not visualized. Aorta: The aortic root and ascending aorta are structurally normal, with no evidence of dilitation. Venous: The inferior vena cava is dilated in size with less than 50% respiratory variability, suggesting right atrial pressure of 15 mmHg. IAS/Shunts: No atrial level shunt detected by color flow Doppler.  LEFT VENTRICLE PLAX 2D LVIDd:         3.60 cm   Diastology LVIDs:         2.20 cm   LV e' lateral: 10.10 cm/s LV PW:         0.80 cm LV IVS:        0.80 cm LVOT diam:     1.80 cm LV SV:         33 LV SV Index:   21 LVOT Area:     2.54 cm  RIGHT VENTRICLE             IVC RV S prime:     11.60 cm/s  IVC diam: 2.00 cm LEFT ATRIUM             Index        RIGHT ATRIUM           Index LA diam:        3.10 cm 1.91 cm/m   RA Area:     18.20 cm LA Vol (A2C):   62.0 ml 38.27 ml/m  RA Volume:   46.40 ml  28.64 ml/m LA Vol (A4C):   64.0 ml 39.51 ml/m LA Biplane Vol: 65.7 ml 40.56  ml/m  AORTIC VALVE LVOT Vmax:   85.20 cm/s LVOT Vmean:  55.500 cm/s LVOT VTI:    0.131 m  AORTA Ao Root diam: 2.80 cm Ao Asc diam:  2.80 cm  SHUNTS Systemic VTI:  0.13 m Systemic Diam: 1.80 cm Dietrich Pates MD Electronically signed by Dietrich Pates MD Signature Date/Time: 12/09/2022/1:15:25 PM    Final    DG Chest Port 1 View  Result Date: 12/08/2022 CLINICAL DATA:  Chest pain and shortness of breath EXAM: PORTABLE CHEST 1 VIEW COMPARISON:  09/10/2019 FINDINGS: Cardiac shadow is within normal limits. Lungs are well aerated bilaterally. No focal infiltrate or sizable effusion is seen. No bony abnormality is noted. IMPRESSION: No acute abnormality noted. Electronically Signed   By: Alcide Clever M.D.   On: 12/08/2022 23:14   DG Mandible 4 Views  Result Date: 12/06/2022 CLINICAL DATA:  Right jaw and ear pain for 1 week EXAM: MANDIBLE - 4+ VIEW COMPARISON:  None available FINDINGS: There is no evidence of fracture or other focal bone lesions. IMPRESSION: No radiographic abnormality of the mandible. Electronically Signed   By: Acquanetta Belling M.D.   On: 12/06/2022 16:47    Microbiology: Results for orders placed or performed during the hospital encounter of 12/08/22  SARS Coronavirus 2 by RT PCR (hospital order, performed in [facility] hospital lab) *cepheid single result  test* Anterior Nasal Swab     Status: None   Collection Time: 12/08/22 10:32 PM   Specimen: Anterior Nasal Swab  Result Value Ref Range Status   SARS Coronavirus 2 by RT PCR NEGATIVE NEGATIVE Final    Comment: Performed at [facility], [address] [place], [place] [address]    Labs: CBC: Recent Labs  Lab 12/08/22 2222 12/08/22 2251 12/10/22 0158  WBC 10.2  --  8.4  HGB 14.8 15.3* 12.9  HCT 46.4* 45.0 39.6  MCV 94.1  --  93.8  PLT 269  --  218   Basic Metabolic Panel: Recent Labs  Lab 12/08/22 2222 12/08/22 2251 12/09/22 0116 12/09/22 1947 12/10/22 0158  NA 136 137 138 136  --   K 3.6 3.6 3.9 4.2  --   CL 100  103 103 102  --   CO2 21*  --  24 26  --   GLUCOSE 122* 127* 106* 137*  --   BUN 20 21 19 14   --   CREATININE 0.78 0.70 0.75 0.84  --   CALCIUM 9.3  --  8.9 8.8*  --   MG  --   --  2.0  --  2.0   Liver Function Tests: Recent Labs  Lab 12/09/22 1947  AST 44*  ALT 60*  ALKPHOS 36*  BILITOT 1.0  PROT 6.0*  ALBUMIN 3.5   CBG: No results for input(s): "GLUCAP" in the last 168 hours.  Discharge time spent: less than 30 minutes.  Signed: Charolotte Eke, MD [facility] 12/14/2022

## 2022-12-20 ENCOUNTER — Telehealth: Payer: Self-pay

## 2022-12-20 NOTE — Telephone Encounter (Signed)
Since xray was normal may restart at any point if she feels comfortable

## 2022-12-20 NOTE — Telephone Encounter (Signed)
Pt LVM in triage line stating that she recently discontinued taking her Boniva d/t to advice from provider since recently was receiving care (hospitalized) for a-fib and also had to have imaging done for pain in jaw. Stated that she missed the dose that she was supposed to take on 08/13. Reported her other provider had advised her that her jaw xray was WNL but to hold off on starting back on medication a couple more weeks longer to make sure all was well prior to re-starting. However, pt wanted to reach out to you as well to get your opinion on restarting.   Please advise.

## 2022-12-20 NOTE — Telephone Encounter (Signed)
Pt notified and voiced understanding. Encounter closed.

## 2022-12-21 ENCOUNTER — Ambulatory Visit (HOSPITAL_COMMUNITY)
Admission: RE | Admit: 2022-12-21 | Discharge: 2022-12-21 | Disposition: A | Discharge status: Home / Self Care | Payer: Medicare PPO | Source: Ambulatory Visit | Attending: Internal Medicine | Admitting: Internal Medicine

## 2022-12-21 VITALS — BP 126/66 | HR 77 | Ht 65.0 in | Wt 124.8 lb

## 2022-12-21 DIAGNOSIS — E039 Hypothyroidism, unspecified: Secondary | ICD-10-CM | POA: Diagnosis not present

## 2022-12-21 DIAGNOSIS — R9431 Abnormal electrocardiogram [ECG] [EKG]: Secondary | ICD-10-CM | POA: Insufficient documentation

## 2022-12-21 DIAGNOSIS — D6869 Other thrombophilia: Secondary | ICD-10-CM | POA: Diagnosis not present

## 2022-12-21 DIAGNOSIS — I4891 Unspecified atrial fibrillation: Secondary | ICD-10-CM

## 2022-12-21 DIAGNOSIS — E785 Hyperlipidemia, unspecified: Secondary | ICD-10-CM | POA: Diagnosis not present

## 2022-12-21 DIAGNOSIS — I480 Paroxysmal atrial fibrillation: Secondary | ICD-10-CM | POA: Insufficient documentation

## 2022-12-21 DIAGNOSIS — Z7901 Long term (current) use of anticoagulants: Secondary | ICD-10-CM | POA: Diagnosis not present

## 2022-12-21 MED ORDER — DILTIAZEM HCL ER COATED BEADS 120 MG PO CP24: 120.0000 mg | ORAL_CAPSULE | Freq: Every day | ORAL | 3 refills | Status: DC

## 2022-12-21 MED ORDER — APIXABAN 2.5 MG PO TABS: 2.5000 mg | ORAL_TABLET | Freq: Two times a day (BID) | ORAL | 3 refills | Status: DC

## 2022-12-21 NOTE — Progress Notes (Signed)
Primary Care Physician: Harvest Forest, MD Primary Cardiologist: None Electrophysiologist: None     Referring Physician: Dr. Ermalene Searing Megan Molina is Megan 89 y.o. female with Megan history of HLD, hypothyroidism, and paroxysmal atrial fibrillation who presents for consultation in the [facility]. Admitted 8/15-17/24 for Afib with RVR. Converted to NSR during admission. Discharged on Cardizem 120 mg daily. Patient is on Eliquis 2.5 mg BID for Megan CHADS2VASC score of 3.  On evaluation today, she is currently in NSR. She could feel Megan little short of breath with episode that led to hospital admission; she felt her pulse and checked her BP noting abnormal heart rate. She has not missed any doses of Cardizem or Eliquis.  Today, she denies symptoms of palpitations, chest pain, orthopnea, PND, lower extremity edema, dizziness, presyncope, syncope, snoring, daytime somnolence, bleeding, or neurologic sequela. The patient is tolerating medications without difficulties and is otherwise without complaint today.   she has Megan BMI of Body mass index is 20.77 kg/m.Marland Kitchen Filed Weights   12/21/22 1340  Weight: 56.6 kg    Current Outpatient Medications  Medication Sig Dispense Refill   Ascorbic Acid (VITAMIN C PO) Take 1 tablet by mouth 3 (three) times Megan week.     Calcium Carb-Cholecalciferol (CALCIUM 500 + D3 PO) Take 500 mg by mouth.     Cholecalciferol (VITAMIN D PO) Take 2,000 Units by mouth.     Cyanocobalamin (VITAMIN B-12 PO) Take 1 tablet by mouth every 3 (three) days.     dorzolamide (TRUSOPT) 2 % ophthalmic solution Place 1 drop into both eyes 2 (two) times daily.      ibandronate (BONIVA) 150 MG tablet TAKE 1 TABLET BY MOUTH EVERY 30 DAYS IN THE MORNING, WITH FULL GLASS OF WATER ON AN EMPTY STOMACH. NOTHING BY MOUTH OR LIE DOWN FOR NEXT 30 3 tablet 4   levothyroxine (SYNTHROID) 25 MCG tablet Take 25 mcg by mouth every other day. T,TH,SAT,SUN     levothyroxine (SYNTHROID)  50 MCG tablet Take 50 mcg by mouth every other day. M,W,F     OMEGA-3 FATTY ACIDS PO Take 1 tablet by mouth every 3 (three) days.     rosuvastatin (CRESTOR) 10 MG tablet Take 10 mg by mouth daily.     tretinoin (RETIN-Megan) 0.05 % cream Apply topically at bedtime.     apixaban (ELIQUIS) 2.5 MG TABS tablet Take 1 tablet (2.5 mg total) by mouth 2 (two) times daily. 60 tablet 3   diltiazem (CARDIZEM CD) 120 MG 24 hr capsule Take 1 capsule (120 mg total) by mouth daily. 30 capsule 3   No current facility-administered medications for this encounter.    Atrial Fibrillation Management history:  Previous antiarrhythmic drugs: None Previous cardioversions: None Previous ablations: None Anticoagulation history: Eliquis 2.5 mg BID   ROS- All systems are reviewed and negative except as per the HPI above.  Physical Exam: BP 126/66   Pulse 77   Ht 5\' 5"  (1.651 m)   Wt 56.6 kg   BMI 20.77 kg/m   GEN: Well nourished, well developed in no acute distress NECK: No JVD; No carotid bruits CARDIAC: Regular rate and rhythm, no murmurs, rubs, gallops RESPIRATORY:  Clear to auscultation without rales, wheezing or rhonchi  ABDOMEN: Soft, non-tender, non-distended EXTREMITIES:  No edema; No deformity   EKG today demonstrates  Vent. rate 77 BPM PR interval 182 ms QRS duration 72 ms QT/QTcB 354/400 ms P-R-T axes -25 87 -  18 Sinus rhythm with occasional Premature ventricular complexes Cannot rule out Anterior infarct , age undetermined Abnormal ECG When compared with ECG of 10-Dec-2022 04:53, PREVIOUS ECG IS PRESENT  Echo 12/09/22 demonstrated  1. Left ventricular ejection fraction, by estimation, is 70 to 75%. The  left ventricle has hyperdynamic function. The left ventricle has no  regional wall motion abnormalities. Left ventricular diastolic parameters  are indeterminate.   2. Right ventricular systolic function is normal. The right ventricular  size is normal.   3. Left atrial size was  moderately dilated.   4. Right atrial size was mildly dilated.   5. The mitral valve is normal in structure. Mild mitral valve  regurgitation.   6. The aortic valve is normal in structure. Aortic valve regurgitation is  not visualized.   7. The inferior vena cava is dilated in size with <50% respiratory  variability, suggesting right atrial pressure of 15 mmHg.    ASSESSMENT & PLAN CHA2DS2-VASc Score = 3  The patient's score is based upon: CHF History: 0 HTN History: 0 Diabetes History: 0 Stroke History: 0 Vascular Disease History: 0 Age Score: 2 Gender Score: 1       ASSESSMENT AND PLAN: Paroxysmal Atrial Fibrillation (ICD10:  I48.0) The patient's CHA2DS2-VASc score is 3, indicating Megan 3.2% annual risk of stroke.    She is currently in NSR.  Education provided about Afib. Discussion about medication treatments going forward if indicated. After discussion, we will proceed with conservative observation at this time. Rhythm monitoring device recommended.   Continue Cardizem 120 mg daily.  Secondary Hypercoagulable State (ICD10:  D68.69) The patient is at significant risk for stroke/thromboembolism based upon her CHA2DS2-VASc Score of 3.  Continue Apixaban (Eliquis).  No missed doses. Continue Eliquis 2.5 mg BID.    Follow up 1 month; CBC.    Megan Bells, PA-C  Afib Clinic [facility] [address] [place], [place] [address] [phone]

## 2023-01-18 ENCOUNTER — Ambulatory Visit (HOSPITAL_COMMUNITY)
Admission: RE | Admit: 2023-01-18 | Discharge: 2023-01-18 | Disposition: A | Discharge status: Home / Self Care | Payer: Medicare PPO | Source: Ambulatory Visit | Attending: Internal Medicine | Admitting: Internal Medicine

## 2023-01-18 VITALS — BP 130/66 | HR 74 | Ht 65.0 in | Wt 124.4 lb

## 2023-01-18 DIAGNOSIS — I4891 Unspecified atrial fibrillation: Secondary | ICD-10-CM | POA: Diagnosis not present

## 2023-01-18 DIAGNOSIS — Z79899 Other long term (current) drug therapy: Secondary | ICD-10-CM | POA: Insufficient documentation

## 2023-01-18 DIAGNOSIS — E039 Hypothyroidism, unspecified: Secondary | ICD-10-CM | POA: Insufficient documentation

## 2023-01-18 DIAGNOSIS — Z7901 Long term (current) use of anticoagulants: Secondary | ICD-10-CM | POA: Insufficient documentation

## 2023-01-18 DIAGNOSIS — I480 Paroxysmal atrial fibrillation: Secondary | ICD-10-CM | POA: Diagnosis not present

## 2023-01-18 DIAGNOSIS — E785 Hyperlipidemia, unspecified: Secondary | ICD-10-CM | POA: Insufficient documentation

## 2023-01-18 DIAGNOSIS — D6869 Other thrombophilia: Secondary | ICD-10-CM | POA: Insufficient documentation

## 2023-01-18 LAB — CBC
HCT: 42.3 % (ref 36.0–46.0)
Hemoglobin: 14 g/dL (ref 12.0–15.0)
MCH: 31.5 pg (ref 26.0–34.0)
MCHC: 33.1 g/dL (ref 30.0–36.0)
MCV: 95.1 fL (ref 80.0–100.0)
Platelets: 243 10*3/uL (ref 150–400)
RBC: 4.45 MIL/uL (ref 3.87–5.11)
RDW: 12.4 % (ref 11.5–15.5)
WBC: 8.1 10*3/uL (ref 4.0–10.5)
nRBC: 0 % (ref 0.0–0.2)

## 2023-01-18 MED ORDER — APIXABAN 2.5 MG PO TABS: 2.5000 mg | ORAL_TABLET | Freq: Two times a day (BID) | ORAL | 2 refills | Status: DC

## 2023-01-18 MED ORDER — DILTIAZEM HCL ER COATED BEADS 120 MG PO CP24: 120.0000 mg | ORAL_CAPSULE | Freq: Every day | ORAL | 2 refills | Status: DC

## 2023-01-18 NOTE — Progress Notes (Signed)
Primary Care Physician: Harvest Forest, MD Primary Cardiologist: None Electrophysiologist: None     Referring Physician: Dr. Ermalene Searing Megan Molina is Megan 89 y.o. female with Megan history of HLD, hypothyroidism, and paroxysmal atrial fibrillation who presents for consultation in the [facility]. Admitted 8/15-17/24 for Afib with RVR. Converted to NSR during admission. Discharged on Cardizem 120 mg daily. Patient is on Eliquis 2.5 mg BID for Megan CHADS2VASC score of 3.  On evaluation today, she is currently in NSR. She could feel Megan little short of breath with episode that led to hospital admission; she felt her pulse and checked her BP noting abnormal heart rate. She has not missed any doses of Cardizem or Eliquis.  On follow up 01/18/23, she is currently in NSR. Since last office visit, she has had no episodes of Afib. She has stopped drinking alcohol because she does not want to experience that episode again. She feels well overall and can do daily activities without limitation. No bleeding issues on Eliquis 2.5 mg BID.   Today, she denies symptoms of palpitations, chest pain, orthopnea, PND, lower extremity edema, dizziness, presyncope, syncope, snoring, daytime somnolence, bleeding, or neurologic sequela. The patient is tolerating medications without difficulties and is otherwise without complaint today.   she has Megan BMI of Body mass index is 20.7 kg/m.Marland Kitchen Filed Weights   01/18/23 1347  Weight: 56.4 kg     Current Outpatient Medications  Medication Sig Dispense Refill   apixaban (ELIQUIS) 2.5 MG TABS tablet Take 1 tablet (2.5 mg total) by mouth 2 (two) times daily. 60 tablet 3   Ascorbic Acid (VITAMIN C PO) Take 1 tablet by mouth 3 (three) times Megan week.     Calcium Carb-Cholecalciferol (CALCIUM 500 + D3 PO) Take 500 mg by mouth daily.     Cholecalciferol (VITAMIN D PO) Take 2,000 Units by mouth.     Cyanocobalamin (VITAMIN B-12 PO) Take 1 tablet by mouth every  3 (three) days.     diltiazem (CARDIZEM CD) 120 MG 24 hr capsule Take 1 capsule (120 mg total) by mouth daily. 30 capsule 3   dorzolamide (TRUSOPT) 2 % ophthalmic solution Place 1 drop into both eyes 2 (two) times daily.      ibandronate (BONIVA) 150 MG tablet TAKE 1 TABLET BY MOUTH EVERY 30 DAYS IN THE MORNING, WITH FULL GLASS OF WATER ON AN EMPTY STOMACH. NOTHING BY MOUTH OR LIE DOWN FOR NEXT 30 3 tablet 4   levothyroxine (SYNTHROID) 25 MCG tablet Take 25 mcg by mouth every other day. T,TH,SAT,SUN     levothyroxine (SYNTHROID) 50 MCG tablet Take 50 mcg by mouth every other day. M,W,F     OMEGA-3 FATTY ACIDS PO Take 1 tablet by mouth every 3 (three) days.     rosuvastatin (CRESTOR) 10 MG tablet Take 10 mg by mouth daily.     tretinoin (RETIN-Megan) 0.05 % cream Apply topically at bedtime.     No current facility-administered medications for this encounter.    Atrial Fibrillation Management history:  Previous antiarrhythmic drugs: None Previous cardioversions: None Previous ablations: None Anticoagulation history: Eliquis 2.5 mg BID   ROS- All systems are reviewed and negative except as per the HPI above.  Physical Exam: BP 130/66   Pulse 74   Ht 5\' 5"  (1.651 m)   Wt 56.4 kg   BMI 20.70 kg/m   GEN- The patient is well appearing, alert and oriented x 3 today.  Neck - no JVD or carotid bruit noted Lungs- Clear to ausculation bilaterally, normal work of breathing Heart- Regular rate and rhythm, no murmurs, rubs or gallops, PMI not laterally displaced Extremities- no clubbing, cyanosis, or edema Skin - no rash or ecchymosis noted   EKG today demonstrates  Vent. rate 74 BPM PR interval 200 ms QRS duration 70 ms QT/QTcB 364/404 ms P-R-T axes 86 60 78 Normal sinus rhythm with sinus arrhythmia Indeterminate axis Possible Anterior infarct , age undetermined Abnormal ECG When compared with ECG of 21-Dec-2022 13:49, PREVIOUS ECG IS PRESENT  Echo 12/09/22 demonstrated  1. Left  ventricular ejection fraction, by estimation, is 70 to 75%. The  left ventricle has hyperdynamic function. The left ventricle has no  regional wall motion abnormalities. Left ventricular diastolic parameters  are indeterminate.   2. Right ventricular systolic function is normal. The right ventricular  size is normal.   3. Left atrial size was moderately dilated.   4. Right atrial size was mildly dilated.   5. The mitral valve is normal in structure. Mild mitral valve  regurgitation.   6. The aortic valve is normal in structure. Aortic valve regurgitation is  not visualized.   7. The inferior vena cava is dilated in size with <50% respiratory  variability, suggesting right atrial pressure of 15 mmHg.    ASSESSMENT & PLAN CHA2DS2-VASc Score = 3  The patient's score is based upon: CHF History: 0 HTN History: 0 Diabetes History: 0 Stroke History: 0 Vascular Disease History: 0 Age Score: 2 Gender Score: 1       ASSESSMENT AND PLAN: Paroxysmal Atrial Fibrillation (ICD10:  I48.0) The patient's CHA2DS2-VASc score is 3, indicating Megan 3.2% annual risk of stroke.    She is currently in NSR.  She has stopped drinking alcohol and has not had any further episodes. We will continue with conservative observation.  Continue Cardizem 120 mg daily.  Secondary Hypercoagulable State (ICD10:  D68.69) The patient is at significant risk for stroke/thromboembolism based upon her CHA2DS2-VASc Score of 3.  Continue Apixaban (Eliquis).  No missed doses. Continue Eliquis 2.5 mg BID. Dosage correct based on age and weight. CBC drawn today.   Follow up 6 months Afib clinic.    Megan Bells, PA-C  Afib Clinic [facility] [address] [place], [place] [address] [phone]

## 2023-01-30 ENCOUNTER — Ambulatory Visit
Admission: RE | Admit: 2023-01-30 | Discharge: 2023-01-30 | Disposition: A | Discharge status: Home / Self Care | Payer: Medicare PPO | Source: Ambulatory Visit | Attending: Internal Medicine | Admitting: Internal Medicine

## 2023-01-30 DIAGNOSIS — Z1231 Encounter for screening mammogram for malignant neoplasm of breast: Secondary | ICD-10-CM

## 2023-01-31 ENCOUNTER — Other Ambulatory Visit (HOSPITAL_COMMUNITY): Payer: Self-pay | Admitting: Internal Medicine

## 2023-01-31 DIAGNOSIS — R600 Localized edema: Secondary | ICD-10-CM

## 2023-02-06 ENCOUNTER — Ambulatory Visit (HOSPITAL_COMMUNITY)
Admission: RE | Admit: 2023-02-06 | Discharge: 2023-02-06 | Disposition: A | Discharge status: Home / Self Care | Payer: Medicare PPO | Source: Ambulatory Visit | Attending: Internal Medicine | Admitting: Internal Medicine

## 2023-02-06 DIAGNOSIS — R600 Localized edema: Secondary | ICD-10-CM | POA: Diagnosis present

## 2023-02-07 ENCOUNTER — Ambulatory Visit: Payer: Medicare PPO | Admitting: Podiatry

## 2023-02-07 DIAGNOSIS — M79675 Pain in left toe(s): Secondary | ICD-10-CM | POA: Diagnosis not present

## 2023-02-07 DIAGNOSIS — L603 Nail dystrophy: Secondary | ICD-10-CM

## 2023-02-07 DIAGNOSIS — M79674 Pain in right toe(s): Secondary | ICD-10-CM | POA: Diagnosis not present

## 2023-02-07 DIAGNOSIS — B351 Tinea unguium: Secondary | ICD-10-CM

## 2023-02-07 NOTE — Progress Notes (Signed)
Subjective:  Patient ID: Megan Molina, female    DOB: 1933-01-07,  MRN: [id number]  Megan Molina presents to clinic today for:  Chief Complaint  Patient presents with   Nail Problem     Pt presents for discolored Right great toenail was  it got removed last year and it's growing back and hard.   Patient notes nails are thick, discolored, elongated and painful in shoegear when trying to ambulate.  She notes that the right hallux nail has not been growing in length.  The right great toenail was removed last year and has returned very thick, hard, and discolored.  PCP is Megan Forest, Megan Molina.  Past Medical History:  Diagnosis Date   Glaucoma    Hypercholesteremia    Hypothyroid    Osteopenia 06/2016   T score -2.4 stable from prior DEXA.    Past Surgical History:  Procedure Laterality Date   ABCESS DRAINAGE     INTESTINAL ABCESS   APPENDECTOMY     BREAST BIOPSY Right    BREAST SURGERY     BIOPSY   cataract surgery     COLONOSCOPY  2013   INGUINAL HERNIA REPAIR Bilateral 09/02/2019   Procedure: LAPAROSCOPIC BILATERAL HERNIA REPAIR, BILATERAL FEMORAL AND LEFT INGUINAL;  Surgeon: Karie Soda, Megan Molina;  Location: [facility];  Service: General;  Laterality: Bilateral;    Allergies  Allergen Reactions   Sulfa Antibiotics Rash   Review of Systems: Negative except as noted in the HPI.  Objective:  Megan Molina is a pleasant 89 y.o. female in NAD. AAO x 3.  Vascular Examination: Capillary refill time is 3-5 seconds to toes bilateral. Palpable pedal pulses b/l LE. Digital hair sparse b/l.  Skin temperature gradient WNL b/l. No varicosities b/l. No cyanosis noted b/l.   Dermatological Examination: Pedal skin with normal turgor, texture and tone b/l. No open wounds. No interdigital macerations b/l. Toenails on bilateral hallux are 6mm thick, discolored yellow, dystrophic with subungual debris. There is pain with compression of the nail plates.  The second toenails are thickened and  dystrophic bilateral.  They are elongated x4.  Neurological Examination: Protective sensation intact bilateral LE. Vibratory sensation intact bilateral LE.  Assessment/Plan: 1. Pain due to onychomycosis of toenails of both feet   2. Nail dystrophy     The mycotic hallux toenails were sharply debrided x2 with sterile nail nippers and a power debriding burr to decrease bulk/thickness and length.  These clippings were sent to [facility] laboratories for fungal culture.  Informed patient that if we often start oral or topical therapy to treat the fungal nails that the insurance companies have been expecting a positive fungal culture on file.  Will try to start the patient on a topical medication at first only introduced laser treatment depending on how she is improving.  Will try to hold off on oral medication  Informed patient it may take up to 3 to 4 weeks for the fungal culture to return.  Dystrophic toenails were trimmed on 2nd toes.  Return if symptoms worsen or fail to improve, for will call patient.   Clerance Lav, DPM, FACFAS [facility]     [address], [place] [address]  Office [phone]  Fax [phone]

## 2023-02-15 ENCOUNTER — Encounter: Payer: Self-pay | Admitting: Podiatry

## 2023-02-15 DIAGNOSIS — B351 Tinea unguium: Secondary | ICD-10-CM

## 2023-02-15 MED ORDER — CICLOPIROX 8 % EX SOLN: Freq: Every day | CUTANEOUS | 11 refills | Status: DC

## 2023-02-16 NOTE — Progress Notes (Signed)
Cardiology Office Note:    Date:  02/27/2023   ID:  Wetzel Bjornstad, DOB 11/12/1932, MRN [id number]  PCP:  Harvest Forest, MD   [facility] Providers Cardiologist:  None     Referring MD: Harvest Forest, MD   Chief Complaint  Patient presents with   Edema    Pt stated that she has some slight swelling in the left ankle and some in the right ankle   Atrial Fibrillation    History of Present Illness:    Megan Molina is a 89 y.o. female is seen at the request of Dr Corky Downs for evaluation of AFib. She has a history of hypothyroidism and HLD. Admitted 8/15-17/24 for Afib with RVR. BP was quite high. Converted to NSR during admission. Echo OK with mild LAE. Discharged on Cardizem 120 mg daily. Patient is on Eliquis 2.5 mg BID for a CHADS2VASC score of 3. Was seen in follow up in Afib clinic and was maintaining NSR. Stopped drinking Etoh and cut back on coffee.   I had seen her previously in 2021 for palpitations. At that time Echo was OK and event monitor just showed isolated PVCs.    On follow up today she is feeling well. Denies any recurrent Afib. No SOB or chest pain. Noted some swelling in left leg since August. LE doppler negative for DVT. Tolerating meds well.   Past Medical History:  Diagnosis Date   Glaucoma    Hypercholesteremia    Hypothyroid    Osteopenia 06/2016   T score -2.4 stable from prior DEXA.    Past Surgical History:  Procedure Laterality Date   ABCESS DRAINAGE     INTESTINAL ABCESS   APPENDECTOMY     BREAST BIOPSY Right    BREAST SURGERY     BIOPSY   cataract surgery     COLONOSCOPY  2013   INGUINAL HERNIA REPAIR Bilateral 09/02/2019   Procedure: LAPAROSCOPIC BILATERAL HERNIA REPAIR, BILATERAL FEMORAL AND LEFT INGUINAL;  Surgeon: Karie Soda, MD;  Location: [facility];  Service: General;  Laterality: Bilateral;    Current Medications: Current Meds  Medication Sig   apixaban (ELIQUIS) 2.5 MG TABS tablet Take 1 tablet (2.5 mg total) by mouth 2  (two) times daily.   Ascorbic Acid (VITAMIN C PO) Take 1 tablet by mouth 3 (three) times a week.   Calcium Carb-Cholecalciferol (CALCIUM 500 + D3 PO) Take 500 mg by mouth daily.   Cholecalciferol (VITAMIN D PO) Take 2,000 Units by mouth.   ciclopirox (PENLAC) 8 % solution Apply topically at bedtime. Apply over nail and surrounding skin. Apply daily over previous coat. Remove weekly with polish remover.   Cyanocobalamin (VITAMIN B-12 PO) Take 1 tablet by mouth every 3 (three) days.   diltiazem (CARDIZEM CD) 120 MG 24 hr capsule Take 1 capsule (120 mg total) by mouth daily.   dorzolamide (TRUSOPT) 2 % ophthalmic solution Place 1 drop into both eyes 2 (two) times daily.    ibandronate (BONIVA) 150 MG tablet TAKE 1 TABLET BY MOUTH EVERY 30 DAYS IN THE MORNING, WITH FULL GLASS OF WATER ON AN EMPTY STOMACH. NOTHING BY MOUTH OR LIE DOWN FOR NEXT 30   levothyroxine (SYNTHROID) 25 MCG tablet Take 25 mcg by mouth every other day. T,TH,SAT,SUN   levothyroxine (SYNTHROID) 50 MCG tablet Take 50 mcg by mouth every other day. M,W,F   OMEGA-3 FATTY ACIDS PO Take 1 tablet by mouth every 3 (three) days.   rosuvastatin (CRESTOR) 10 MG  tablet Take 10 mg by mouth daily.   tretinoin (RETIN-A) 0.05 % cream Apply topically at bedtime.     Allergies:   Sulfa antibiotics   Social History   Socioeconomic History   Marital status: Widowed    Spouse name: Not on file   Number of children: 3   Years of education: Not on file   Highest education level: Not on file  Occupational History   Not on file  Tobacco Use   Smoking status: Former    Current packs/day: 0.00    Types: Cigarettes    Quit date: 04/25/1988    Years since quitting: 34.8   Smokeless tobacco: Never  Vaping Use   Vaping status: Never Used  Substance and Sexual Activity   Alcohol use: Yes    Alcohol/week: 7.0 standard drinks of alcohol    Types: 7 Standard drinks or equivalent per week   Drug use: No   Sexual activity: Not Currently     Partners: Male    Birth control/protection: Post-menopausal    Comment: 1st intercourse 89 yo-Fewer than 5 partners, answered no to all medicare screening questions  Other Topics Concern   Not on file  Social History Narrative   Not on file   Social Determinants of Health   Financial Resource Strain: Not on file  Food Insecurity: No Food Insecurity (12/09/2022)   Hunger Vital Sign    Worried About Running Out of Food in the Last Year: Never true    Ran Out of Food in the Last Year: Never true  Transportation Needs: No Transportation Needs (12/09/2022)   PRAPARE - Administrator, Civil Service (Medical): No    Lack of Transportation (Non-Medical): No  Physical Activity: Not on file  Stress: Not on file  Social Connections: Not on file     Family History: The patient's family history includes Pancreatic cancer in her mother. There is no history of Colon cancer or Stomach cancer.  ROS:   Please see the history of present illness.     All other systems reviewed and are negative.  EKGs/Labs/Other Studies Reviewed:    The following studies were reviewed today:  EKG Interpretation Date/Time:  Monday February 27 2023 09:39:53 EST Ventricular Rate:  68 PR Interval:  184 QRS Duration:  66 QT Interval:  370 QTC Calculation: 393 R Axis:   -3  Text Interpretation: Normal sinus rhythm Possible Left atrial enlargement ECG OTHERWISE WITHIN NORMAL LIMITS When compared with ECG of 18-Jan-2023 14:00, Questionable change in initial forces of Septal leads Confirmed by [place], Auri Jahnke [phone]) on 02/27/2023 9:49:28 AM   Echo 12/09/22: IMPRESSIONS     1. Left ventricular ejection fraction, by estimation, is 70 to 75%. The  left ventricle has hyperdynamic function. The left ventricle has no  regional wall motion abnormalities. Left ventricular diastolic parameters  are indeterminate.   2. Right ventricular systolic function is normal. The right ventricular  size is normal.   3.  Left atrial size was moderately dilated.   4. Right atrial size was mildly dilated.   5. The mitral valve is normal in structure. Mild mitral valve  regurgitation.   6. The aortic valve is normal in structure. Aortic valve regurgitation is  not visualized.   7. The inferior vena cava is dilated in size with <50% respiratory  variability, suggesting right atrial pressure of 15 mmHg.   EKG Interpretation Date/Time:  Monday February 27 2023 09:39:53 EST Ventricular Rate:  68 PR Interval:  184 QRS Duration:  66 QT Interval:  370 QTC Calculation: 393 R Axis:   -3  Text Interpretation: Normal sinus rhythm Possible Left atrial enlargement ECG OTHERWISE WITHIN NORMAL LIMITS When compared with ECG of 18-Jan-2023 14:00, Questionable change in initial forces of Septal leads Confirmed by [place], Timesha Cervantez [phone]) on 02/27/2023 9:49:28 AM    Recent Labs: 12/08/2022: TSH 1.358 12/09/2022: ALT 60; BUN 14; Creatinine, Ser 0.84; Potassium 4.2; Sodium 136 12/10/2022: Magnesium 2.0 01/18/2023: Hemoglobin 14.0; Platelets 243  Recent Lipid Panel    Component Value Date/Time   CHOL 139 12/10/2022 0158   TRIG 40 12/10/2022 0158   HDL 69 12/10/2022 0158   CHOLHDL 2.0 12/10/2022 0158   VLDL 8 12/10/2022 0158   LDLCALC 62 12/10/2022 0158     Risk Assessment/Calculations:    CHA2DS2-VASc Score = 3   This indicates a 3.2% annual risk of stroke. The patient's score is based upon: CHF History: 0 HTN History: 0 Diabetes History: 0 Stroke History: 0 Vascular Disease History: 0 Age Score: 2 Gender Score: 1               Physical Exam:    VS:  BP 130/68 (BP Location: Left Arm, Patient Position: Sitting, Cuff Size: Normal)   Pulse 68   Ht 5\' 5"  (1.651 m)   Wt 123 lb (55.8 kg)   SpO2 92%   BMI 20.47 kg/m     Wt Readings from Last 3 Encounters:  02/27/23 123 lb (55.8 kg)  01/18/23 124 lb 6.4 oz (56.4 kg)  12/21/22 124 lb 12.8 oz (56.6 kg)     GEN:  Well nourished, well developed in no acute  distress HEENT: Normal NECK: No JVD; No carotid bruits LYMPHATICS: No lymphadenopathy CARDIAC: RRR, no murmurs, rubs, gallops RESPIRATORY:  Clear to auscultation without rales, wheezing or rhonchi  ABDOMEN: Soft, non-tender, non-distended MUSCULOSKELETAL:  1+ LLE edema; No deformity  SKIN: Warm and dry NEUROLOGIC:  Alert and oriented x 3 PSYCHIATRIC:  Normal affect   ASSESSMENT:    1. Paroxysmal atrial fibrillation (HCC)   2. Localized edema   3. Hypercoagulable state due to paroxysmal atrial fibrillation (HCC)   4. Atrial fibrillation, unspecified type (HCC)    PLAN:    In order of problems listed above:  Pafib. One episode. Resolved. NSR today. Continue diltiazem and Eliquis. On lower dose based on age and weight. Follow up in 6 months Left LE edema. No DVT. No evidence of CHF. Conservative therapy with compression hose. HTN controlled           Medication Adjustments/Labs and Tests Ordered: Current medicines are reviewed at length with the patient today.  Concerns regarding medicines are outlined above.  Orders Placed This Encounter  Procedures   EKG 12-Lead   No orders of the defined types were placed in this encounter.   There are no Patient Instructions on file for this visit.   Signed, Shatonya Passon [place], MD  02/27/2023 9:58 AM    [facility]

## 2023-02-27 ENCOUNTER — Ambulatory Visit: Payer: Medicare PPO | Attending: Cardiology | Admitting: Cardiology

## 2023-02-27 ENCOUNTER — Encounter: Payer: Self-pay | Admitting: Cardiology

## 2023-02-27 VITALS — BP 130/68 | HR 68 | Ht 65.0 in | Wt 123.0 lb

## 2023-02-27 DIAGNOSIS — D6869 Other thrombophilia: Secondary | ICD-10-CM

## 2023-02-27 DIAGNOSIS — I480 Paroxysmal atrial fibrillation: Secondary | ICD-10-CM | POA: Diagnosis not present

## 2023-02-27 DIAGNOSIS — I4891 Unspecified atrial fibrillation: Secondary | ICD-10-CM

## 2023-02-27 DIAGNOSIS — R600 Localized edema: Secondary | ICD-10-CM | POA: Diagnosis not present

## 2023-02-27 NOTE — Patient Instructions (Signed)
Medication Instructions:  No change  *If you need a refill on your cardiac medications before your next appointment, please call your pharmacy*   Lab Work: None     Testing/Procedures: None    Follow-Up: At [facility], you and your health needs are our priority.  As part of our continuing mission to provide you with exceptional heart care, we have created designated Provider Care Teams.  These Care Teams include your primary Cardiologist (physician) and Advanced Practice Providers (APPs -  Physician Assistants and Nurse Practitioners) who all work together to provide you with the care you need, when you need it.    Your next appointment:   6 month(s) Call in Jan 2025 to make an appointment for May 2025  Provider:   Dr. [place]

## 2023-06-13 ENCOUNTER — Other Ambulatory Visit: Payer: Self-pay | Admitting: Radiology

## 2023-06-13 DIAGNOSIS — N9489 Other specified conditions associated with female genital organs and menstrual cycle: Secondary | ICD-10-CM

## 2023-06-13 NOTE — Progress Notes (Signed)
 [place] ordered

## 2023-07-04 ENCOUNTER — Ambulatory Visit (INDEPENDENT_AMBULATORY_CARE_PROVIDER_SITE_OTHER): Payer: Medicare PPO

## 2023-07-04 ENCOUNTER — Ambulatory Visit (INDEPENDENT_AMBULATORY_CARE_PROVIDER_SITE_OTHER): Payer: Medicare PPO | Admitting: Radiology

## 2023-07-04 VITALS — BP 110/60

## 2023-07-04 DIAGNOSIS — N958 Other specified menopausal and perimenopausal disorders: Secondary | ICD-10-CM | POA: Diagnosis not present

## 2023-07-04 DIAGNOSIS — N9489 Other specified conditions associated with female genital organs and menstrual cycle: Secondary | ICD-10-CM | POA: Diagnosis not present

## 2023-07-04 NOTE — Progress Notes (Signed)
   Megan Molina 1933/01/06 [id number]   History: Postmenopausal 89 y.o. presents for follow up on right simple adnexal cyst on u/s that has been followed x 4 years. No abdominal or pelvic pain. No other symptoms.   Gynecologic History Postmenopausal Last pap 2016  Obstetric History OB History  Gravida Para Term Preterm AB Living  3 3 3   3   SAB IAB Ectopic Multiple Live Births          # Outcome Date GA Lbr Len/2nd Weight Sex Type Anes PTL Lv  3 Term           2 Term           1 Term                 No data to display           The following portions of the patient's history were reviewed and updated as appropriate: allergies, current medications, past family history, past medical history, past social history, past surgical history, and problem list.  Review of Systems Pertinent items noted in HPI and remainder of comprehensive ROS otherwise negative.  Past medical history, past surgical history, family history and social history were all reviewed and documented in the EPIC chart.  Exam:  Vitals:   07/04/23 1443  BP: 110/60   There is no height or weight on file to calculate BMI.  Details  Reading Physician Reading Date Result Priority  Tanda Rockers, [place] [phone]    07/04/2023 Routine   Narrative & Impression Indication: right adnexal cyst   Anteverted uterus 4.85 x 3.13 x 2.17cm Endometrial thickness 2.71mm   Neither ovary seen   Right adnexal simple avascular cyst 64x88mm Previously measured at 65x49mm 06/30/22   Impression: stable right adnexal cyst       Exam Ended: 07/04/23 14:41 Last Resulted: 07/04/23 15:02    Assessment/Plan:   1. Simple adnexal cyst greater than 1 cm in diameter in postmenopausal patient (Primary) Stable simple avascular cyst No follow up needed   Megan Molina B WHNP-BC, 2:57 PM 07/04/2023

## 2023-07-19 ENCOUNTER — Ambulatory Visit (HOSPITAL_COMMUNITY)
Admission: RE | Admit: 2023-07-19 | Discharge: 2023-07-19 | Disposition: A | Discharge status: Home / Self Care | Payer: Medicare PPO | Source: Ambulatory Visit | Attending: Internal Medicine | Admitting: Internal Medicine

## 2023-07-19 VITALS — BP 130/60 | HR 91 | Ht 65.0 in | Wt 120.0 lb

## 2023-07-19 DIAGNOSIS — E039 Hypothyroidism, unspecified: Secondary | ICD-10-CM | POA: Insufficient documentation

## 2023-07-19 DIAGNOSIS — E785 Hyperlipidemia, unspecified: Secondary | ICD-10-CM | POA: Insufficient documentation

## 2023-07-19 DIAGNOSIS — D6869 Other thrombophilia: Secondary | ICD-10-CM | POA: Diagnosis not present

## 2023-07-19 DIAGNOSIS — Z7901 Long term (current) use of anticoagulants: Secondary | ICD-10-CM | POA: Insufficient documentation

## 2023-07-19 DIAGNOSIS — I498 Other specified cardiac arrhythmias: Secondary | ICD-10-CM | POA: Insufficient documentation

## 2023-07-19 DIAGNOSIS — I480 Paroxysmal atrial fibrillation: Secondary | ICD-10-CM | POA: Diagnosis not present

## 2023-07-19 DIAGNOSIS — Z79899 Other long term (current) drug therapy: Secondary | ICD-10-CM | POA: Diagnosis not present

## 2023-07-19 NOTE — Progress Notes (Signed)
 Primary Care Physician: Harvest Forest, MD Primary Cardiologist: Peter [place], MD Electrophysiologist: None     Referring Physician: Dr. Ermalene Searing A Megan Molina is a 89 y.o. female with a history of HLD, hypothyroidism, and paroxysmal atrial fibrillation who presents for consultation in the [facility]. Admitted 8/15-17/24 for Afib with RVR. Converted to NSR during admission. Discharged on Cardizem 120 mg daily. Patient is on Eliquis 2.5 mg BID for a CHADS2VASC score of 3.  On follow up 07/19/23, she is currently in NSR. She has had no episodes of Afib since last office visit. She stopped drinking her evening whiskey, but she is not happy about it. No bleeding issues on Eliquis 2.5 mg BID.   Today, she denies symptoms of palpitations, chest pain, orthopnea, PND, lower extremity edema, dizziness, presyncope, syncope, snoring, daytime somnolence, bleeding, or neurologic sequela. The patient is tolerating medications without difficulties and is otherwise without complaint today.   she has a BMI of Body mass index is 19.97 kg/m.Marland Kitchen Filed Weights   07/19/23 1348  Weight: 54.4 kg      Current Outpatient Medications  Medication Sig Dispense Refill   apixaban (ELIQUIS) 2.5 MG TABS tablet Take 1 tablet (2.5 mg total) by mouth 2 (two) times daily. 180 tablet 2   Ascorbic Acid (VITAMIN C PO) Take 1 tablet by mouth 3 (three) times a week.     Calcium Carb-Cholecalciferol (CALCIUM 500 + D3 PO) Take 500 mg by mouth daily.     Cholecalciferol (VITAMIN D PO) Take 2,000 Units by mouth.     ciclopirox (PENLAC) 8 % solution Apply topically at bedtime. Apply over nail and surrounding skin. Apply daily over previous coat. Remove weekly with polish remover. 6.6 mL 11   Cyanocobalamin (VITAMIN B-12 PO) Take 1 tablet by mouth every 3 (three) days.     diltiazem (CARDIZEM CD) 120 MG 24 hr capsule Take 1 capsule (120 mg total) by mouth daily. 90 capsule 2   dorzolamide (TRUSOPT)  2 % ophthalmic solution Place 1 drop into both eyes 2 (two) times daily.      ibandronate (BONIVA) 150 MG tablet TAKE 1 TABLET BY MOUTH EVERY 30 DAYS IN THE MORNING, WITH FULL GLASS OF WATER ON AN EMPTY STOMACH. NOTHING BY MOUTH OR LIE DOWN FOR NEXT 30 3 tablet 4   levothyroxine (SYNTHROID) 25 MCG tablet Take 25 mcg by mouth every other day. T,TH,SAT,SUN     levothyroxine (SYNTHROID) 50 MCG tablet Take 50 mcg by mouth every other day. M,W,F     OMEGA-3 FATTY ACIDS PO Take 1 tablet by mouth every 3 (three) days.     rosuvastatin (CRESTOR) 10 MG tablet Take 10 mg by mouth daily.     tretinoin (RETIN-A) 0.05 % cream Apply topically at bedtime.     No current facility-administered medications for this encounter.    Atrial Fibrillation Management history:  Previous antiarrhythmic drugs: None Previous cardioversions: None Previous ablations: None Anticoagulation history: Eliquis 2.5 mg BID   ROS- All systems are reviewed and negative except as per the HPI above.  Physical Exam: BP 130/60   Pulse 91   Ht 5\' 5"  (1.651 m)   Wt 54.4 kg   BMI 19.97 kg/m   GEN- The patient is well appearing, alert and oriented x 3 today.   Neck - no JVD or carotid bruit noted Lungs- Clear to ausculation bilaterally, normal work of breathing Heart- Regular rate and rhythm, no murmurs,  rubs or gallops, PMI not laterally displaced Extremities- no clubbing, cyanosis, or edema Skin - no rash or ecchymosis noted   EKG today demonstrates  Vent. rate 91 BPM PR interval 174 ms QRS duration 64 ms QT/QTcB 342/420 ms P-R-T axes 69 69 78 Sinus rhythm with marked sinus arrhythmia Anterior infarct , age undetermined Abnormal ECG When compared with ECG of 27-Feb-2023 09:39, PREVIOUS ECG IS PRESENT  Echo 12/09/22 demonstrated  1. Left ventricular ejection fraction, by estimation, is 70 to 75%. The  left ventricle has hyperdynamic function. The left ventricle has no  regional wall motion abnormalities. Left  ventricular diastolic parameters  are indeterminate.   2. Right ventricular systolic function is normal. The right ventricular  size is normal.   3. Left atrial size was moderately dilated.   4. Right atrial size was mildly dilated.   5. The mitral valve is normal in structure. Mild mitral valve  regurgitation.   6. The aortic valve is normal in structure. Aortic valve regurgitation is  not visualized.   7. The inferior vena cava is dilated in size with <50% respiratory  variability, suggesting right atrial pressure of 15 mmHg.    ASSESSMENT & PLAN CHA2DS2-VASc Score = 3  The patient's score is based upon: CHF History: 0 HTN History: 0 Diabetes History: 0 Stroke History: 0 Vascular Disease History: 0 Age Score: 2 Gender Score: 1       ASSESSMENT AND PLAN: Paroxysmal Atrial Fibrillation (ICD10:  I48.0) The patient's CHA2DS2-VASc score is 3, indicating a 3.2% annual risk of stroke.    She is currently in NSR. She is doing well overall.    Secondary Hypercoagulable State (ICD10:  D68.69) The patient is at significant risk for stroke/thromboembolism based upon her CHA2DS2-VASc Score of 3.  Continue Apixaban (Eliquis).  Continue Eliquis 2.5 mg BID without interruption. This dose is correct based on patient age and weight.   Follow up 1 year Afib clinic.    Lake Bells, PA-C  Afib Clinic [facility] [address] [place], [place] [address] [phone]

## 2023-08-31 NOTE — Progress Notes (Signed)
 Cardiology Office Note:    Date:  09/04/2023   ID:  Megan Molina, DOB 1932/05/18, MRN [id number]  PCP:  Nohemi Batters, MD   [facility] Providers Cardiologist:  Raeya Merritts [place], MD     Referring MD: Nohemi Batters, MD   Chief Complaint  Patient presents with   Atrial Fibrillation    History of Present Illness:    Megan Molina is a 89 y.o. female is seen at the request of Dr Wilene Hang for evaluation of AFib. She has a history of hypothyroidism and HLD. Admitted 8/15-17/24 for Afib with RVR. BP was quite high. Converted to NSR during admission. Echo OK with mild LAE. Discharged on Cardizem  120 mg daily. Patient is on Eliquis  2.5 mg BID for a CHADS2VASC score of 3. Was seen in follow up in Afib clinic and was maintaining NSR. Stopped drinking Etoh and cut back on coffee.   I had seen her previously in 2021 for palpitations. At that time Echo was OK and event monitor just showed isolated PVCs.    On follow up today she is feeling well. Denies any recurrent Afib. No SOB or chest pain. Noted chronic LE swelling.  LE doppler negative for DVT. Tolerating meds well.   She was seen in Afib clinic in March and doing well.   Past Medical History:  Diagnosis Date   Glaucoma    Hypercholesteremia    Hypothyroid    Osteopenia 06/2016   T score -2.4 stable from prior DEXA.    Past Surgical History:  Procedure Laterality Date   ABCESS DRAINAGE     INTESTINAL ABCESS   APPENDECTOMY     BREAST BIOPSY Right    BREAST SURGERY     BIOPSY   cataract surgery     COLONOSCOPY  2013   INGUINAL HERNIA REPAIR Bilateral 09/02/2019   Procedure: LAPAROSCOPIC BILATERAL HERNIA REPAIR, BILATERAL FEMORAL AND LEFT INGUINAL;  Surgeon: Candyce Champagne, MD;  Location: [facility];  Service: General;  Laterality: Bilateral;    Current Medications: Current Meds  Medication Sig   apixaban  (ELIQUIS ) 2.5 MG TABS tablet Take 1 tablet (2.5 mg total) by mouth 2 (two) times daily.   Ascorbic Acid  (VITAMIN C PO)  Take 1 tablet by mouth 3 (three) times a week.   Calcium  Carb-Cholecalciferol (CALCIUM  500 + D3 PO) Take 500 mg by mouth daily.   Cholecalciferol (VITAMIN D PO) Take 2,000 Units by mouth.   ciclopirox  (PENLAC ) 8 % solution Apply topically at bedtime. Apply over nail and surrounding skin. Apply daily over previous coat. Remove weekly with polish remover.   Cyanocobalamin (VITAMIN B-12 PO) Take 1 tablet by mouth every 3 (three) days.   diltiazem  (CARDIZEM  CD) 120 MG 24 hr capsule Take 1 capsule (120 mg total) by mouth daily.   dorzolamide  (TRUSOPT ) 2 % ophthalmic solution Place 1 drop into both eyes 2 (two) times daily.    ibandronate  (BONIVA ) 150 MG tablet TAKE 1 TABLET BY MOUTH EVERY 30 DAYS IN THE MORNING, WITH FULL GLASS OF WATER  ON AN EMPTY STOMACH. NOTHING BY MOUTH OR LIE DOWN FOR NEXT 30   levothyroxine  (SYNTHROID ) 25 MCG tablet Take 25 mcg by mouth every other day. T,TH,SAT,SUN   levothyroxine  (SYNTHROID ) 50 MCG tablet Take 50 mcg by mouth every other day. M,W,F   OMEGA-3 FATTY ACIDS PO Take 1 tablet by mouth every 3 (three) days.   rosuvastatin  (CRESTOR ) 10 MG tablet Take 10 mg by mouth daily.   tretinoin (RETIN-A) 0.05 %  cream Apply topically at bedtime.     Allergies:   Sulfa antibiotics   Social History   Socioeconomic History   Marital status: Widowed    Spouse name: Not on file   Number of children: 3   Years of education: Not on file   Highest education level: Not on file  Occupational History   Not on file  Tobacco Use   Smoking status: Former    Current packs/day: 0.00    Types: Cigarettes    Quit date: 04/25/1988    Years since quitting: 35.3   Smokeless tobacco: Never  Vaping Use   Vaping status: Never Used  Substance and Sexual Activity   Alcohol use: Yes    Alcohol/week: 7.0 standard drinks of alcohol    Types: 7 Standard drinks or equivalent per week   Drug use: No   Sexual activity: Not Currently    Partners: Male    Birth control/protection:  Post-menopausal    Comment: 1st intercourse 89 yo-Fewer than 5 partners, answered no to all medicare screening questions  Other Topics Concern   Not on file  Social History Narrative   Not on file   Social Drivers of Health   Financial Resource Strain: Not on file  Food Insecurity: No Food Insecurity (12/09/2022)   Hunger Vital Sign    Worried About Running Out of Food in the Last Year: Never true    Ran Out of Food in the Last Year: Never true  Transportation Needs: No Transportation Needs (12/09/2022)   PRAPARE - Administrator, Civil Service (Medical): No    Lack of Transportation (Non-Medical): No  Physical Activity: Not on file  Stress: Not on file  Social Connections: Not on file     Family History: The patient's family history includes Pancreatic cancer in her mother. There is no history of Colon cancer or Stomach cancer.  ROS:   Please see the history of present illness.     All other systems reviewed and are negative.  EKGs/Labs/Other Studies Reviewed:    The following studies were reviewed today:      Echo 12/09/22: IMPRESSIONS     1. Left ventricular ejection fraction, by estimation, is 70 to 75%. The  left ventricle has hyperdynamic function. The left ventricle has no  regional wall motion abnormalities. Left ventricular diastolic parameters  are indeterminate.   2. Right ventricular systolic function is normal. The right ventricular  size is normal.   3. Left atrial size was moderately dilated.   4. Right atrial size was mildly dilated.   5. The mitral valve is normal in structure. Mild mitral valve  regurgitation.   6. The aortic valve is normal in structure. Aortic valve regurgitation is  not visualized.   7. The inferior vena cava is dilated in size with <50% respiratory  variability, suggesting right atrial pressure of 15 mmHg.        Recent Labs: 12/08/2022: TSH 1.358 12/09/2022: ALT 60; BUN 14; Creatinine, Ser 0.84; Potassium 4.2;  Sodium 136 12/10/2022: Magnesium 2.0 01/18/2023: Hemoglobin 14.0; Platelets 243  Recent Lipid Panel    Component Value Date/Time   CHOL 139 12/10/2022 0158   TRIG 40 12/10/2022 0158   HDL 69 12/10/2022 0158   CHOLHDL 2.0 12/10/2022 0158   VLDL 8 12/10/2022 0158   LDLCALC 62 12/10/2022 0158     Risk Assessment/Calculations:    CHA2DS2-VASc Score = 3   This indicates a 3.2% annual risk of stroke. The patient's  score is based upon: CHF History: 0 HTN History: 0 Diabetes History: 0 Stroke History: 0 Vascular Disease History: 0 Age Score: 2 Gender Score: 1               Physical Exam:    VS:  BP 134/70   Pulse 82   Ht 5\' 5"  (1.651 m)   Wt 125 lb (56.7 kg)   SpO2 93%   BMI 20.80 kg/m     Wt Readings from Last 3 Encounters:  09/04/23 125 lb (56.7 kg)  07/19/23 120 lb (54.4 kg)  02/27/23 123 lb (55.8 kg)     GEN:  Well nourished, well developed in no acute distress HEENT: Normal NECK: No JVD; No carotid bruits LYMPHATICS: No lymphadenopathy CARDIAC: RRR, no murmurs, rubs, gallops RESPIRATORY:  Clear to auscultation without rales, wheezing or rhonchi  ABDOMEN: Soft, non-tender, non-distended MUSCULOSKELETAL:  1+ LLE edema; No deformity  SKIN: Warm and dry NEUROLOGIC:  Alert and oriented x 3 PSYCHIATRIC:  Normal affect   ASSESSMENT:    1. Paroxysmal atrial fibrillation (HCC)   2. Localized edema     PLAN:    In order of problems listed above:  Pafib. One episode. Resolved. NSR today. Continue diltiazem  and Eliquis . On lower dose based on age and weight. Follow up in one year Left LE edema. No DVT. No evidence of CHF. Conservative therapy with compression hose. HTN controlled           Medication Adjustments/Labs and Tests Ordered: Current medicines are reviewed at length with the patient today.  Concerns regarding medicines are outlined above.  No orders of the defined types were placed in this encounter.  No orders of the defined types were  placed in this encounter.   There are no Patient Instructions on file for this visit.   Signed, Chardai Gangemi [place], MD  09/04/2023 3:41 PM    [facility]

## 2023-09-04 ENCOUNTER — Encounter: Payer: Self-pay | Admitting: Cardiology

## 2023-09-04 ENCOUNTER — Ambulatory Visit: Payer: Medicare PPO | Attending: Cardiology | Admitting: Cardiology

## 2023-09-04 VITALS — BP 134/70 | HR 82 | Ht 65.0 in | Wt 125.0 lb

## 2023-09-04 DIAGNOSIS — R600 Localized edema: Secondary | ICD-10-CM

## 2023-09-04 DIAGNOSIS — I480 Paroxysmal atrial fibrillation: Secondary | ICD-10-CM

## 2023-09-04 NOTE — Patient Instructions (Signed)

## 2023-09-14 ENCOUNTER — Other Ambulatory Visit: Payer: Self-pay | Admitting: Family Medicine

## 2023-09-14 ENCOUNTER — Encounter: Payer: Self-pay | Admitting: Family Medicine

## 2023-09-14 DIAGNOSIS — R600 Localized edema: Secondary | ICD-10-CM

## 2023-09-22 ENCOUNTER — Other Ambulatory Visit: Payer: Self-pay | Admitting: Family Medicine

## 2023-09-22 DIAGNOSIS — R600 Localized edema: Secondary | ICD-10-CM

## 2023-09-22 DIAGNOSIS — I38 Endocarditis, valve unspecified: Secondary | ICD-10-CM

## 2023-09-22 DIAGNOSIS — I872 Venous insufficiency (chronic) (peripheral): Secondary | ICD-10-CM

## 2023-10-02 ENCOUNTER — Other Ambulatory Visit: Payer: Self-pay | Admitting: Internal Medicine

## 2023-10-02 ENCOUNTER — Other Ambulatory Visit: Payer: Self-pay | Admitting: Radiology

## 2023-10-02 DIAGNOSIS — M810 Age-related osteoporosis without current pathological fracture: Secondary | ICD-10-CM

## 2023-10-02 DIAGNOSIS — Z1231 Encounter for screening mammogram for malignant neoplasm of breast: Secondary | ICD-10-CM

## 2023-10-11 ENCOUNTER — Other Ambulatory Visit

## 2023-10-16 ENCOUNTER — Other Ambulatory Visit

## 2023-10-18 ENCOUNTER — Other Ambulatory Visit (HOSPITAL_COMMUNITY): Payer: Self-pay | Admitting: *Deleted

## 2023-10-18 MED ORDER — DILTIAZEM HCL ER COATED BEADS 120 MG PO CP24: 120.0000 mg | ORAL_CAPSULE | Freq: Every day | ORAL | 9 refills | Status: DC

## 2023-10-19 ENCOUNTER — Encounter: Payer: Self-pay | Admitting: Internal Medicine

## 2023-10-19 ENCOUNTER — Other Ambulatory Visit

## 2023-10-19 ENCOUNTER — Other Ambulatory Visit: Payer: Self-pay | Admitting: Internal Medicine

## 2023-10-19 DIAGNOSIS — I6523 Occlusion and stenosis of bilateral carotid arteries: Secondary | ICD-10-CM

## 2023-10-20 ENCOUNTER — Emergency Department (HOSPITAL_COMMUNITY)
Admission: EM | Admit: 2023-10-20 | Discharge: 2023-10-21 | Disposition: A | Discharge status: Home / Self Care | Attending: Emergency Medicine | Admitting: Emergency Medicine

## 2023-10-20 ENCOUNTER — Other Ambulatory Visit: Payer: Self-pay

## 2023-10-20 ENCOUNTER — Ambulatory Visit
Admission: RE | Admit: 2023-10-20 | Discharge: 2023-10-20 | Disposition: A | Discharge status: Home / Self Care | Source: Ambulatory Visit | Attending: Internal Medicine | Admitting: Internal Medicine

## 2023-10-20 DIAGNOSIS — E039 Hypothyroidism, unspecified: Secondary | ICD-10-CM | POA: Diagnosis not present

## 2023-10-20 DIAGNOSIS — R42 Dizziness and giddiness: Secondary | ICD-10-CM | POA: Insufficient documentation

## 2023-10-20 DIAGNOSIS — I6523 Occlusion and stenosis of bilateral carotid arteries: Secondary | ICD-10-CM

## 2023-10-20 DIAGNOSIS — N3000 Acute cystitis without hematuria: Secondary | ICD-10-CM | POA: Diagnosis not present

## 2023-10-20 LAB — COMPREHENSIVE METABOLIC PANEL WITH GFR
ALT: 20 U/L (ref 0–44)
AST: 20 U/L (ref 15–41)
Albumin: 4 g/dL (ref 3.5–5.0)
Alkaline Phosphatase: 46 U/L (ref 38–126)
Anion gap: 8 (ref 5–15)
BUN: 18 mg/dL (ref 8–23)
CO2: 23 mmol/L (ref 22–32)
Calcium: 8.9 mg/dL (ref 8.9–10.3)
Chloride: 108 mmol/L (ref 98–111)
Creatinine, Ser: 0.68 mg/dL (ref 0.44–1.00)
GFR, Estimated: >60 mL/min (ref >60)
Glucose, Bld: 129 mg/dL — ABNORMAL HIGH (ref 70–99)
Potassium: 3.7 mmol/L (ref 3.5–5.1)
Sodium: 139 mmol/L (ref 135–145)
Total Bilirubin: 1.1 mg/dL (ref 0.0–1.2)
Total Protein: 6.7 g/dL (ref 6.5–8.1)

## 2023-10-20 LAB — URINALYSIS, ROUTINE W REFLEX MICROSCOPIC
Bilirubin Urine: NEGATIVE
Glucose, UA: NEGATIVE mg/dL
Hgb urine dipstick: NEGATIVE
Ketones, ur: 5 mg/dL — AB
Nitrite: NEGATIVE
Protein, ur: NEGATIVE mg/dL
Specific Gravity, Urine: 1.015 (ref 1.005–1.030)
pH: 5 (ref 5.0–8.0)

## 2023-10-20 LAB — CBC
HCT: 43.3 % (ref 36.0–46.0)
Hemoglobin: 14.5 g/dL (ref 12.0–15.0)
MCH: 31.4 pg (ref 26.0–34.0)
MCHC: 33.5 g/dL (ref 30.0–36.0)
MCV: 93.7 fL (ref 80.0–100.0)
Platelets: 263 10*3/uL (ref 150–400)
RBC: 4.62 MIL/uL (ref 3.87–5.11)
RDW: 13 % (ref 11.5–15.5)
WBC: 8.8 10*3/uL (ref 4.0–10.5)
nRBC: 0 % (ref 0.0–0.2)

## 2023-10-20 LAB — TROPONIN I (HIGH SENSITIVITY): Troponin I (High Sensitivity): 6 ng/L (ref <18)

## 2023-10-20 MED ORDER — CEPHALEXIN 500 MG PO CAPS: 500.0000 mg | ORAL_CAPSULE | Freq: Two times a day (BID) | ORAL | 0 refills | Status: AC

## 2023-10-20 MED ORDER — SODIUM CHLORIDE 0.9 % IV BOLUS
500.0000 mL | Freq: Once | INTRAVENOUS | Status: AC
Start: 2023-10-20 — End: 2023-10-21
  Administered 2023-10-20: 500 mL via INTRAVENOUS

## 2023-10-20 NOTE — ED Provider Notes (Signed)
 Emergency Department Provider Note   I have reviewed the triage vital signs and the nursing notes.   HISTORY  Chief Complaint Dizziness   HPI Megan Molina is a 89 y.o. female past history of hypercholesterolemia presents to the emergency department with lightheadedness.  Symptoms been ongoing for the past 2 weeks.  She states this began with some leg swelling.  She was prescribed Lasix which she took for 2 days at which point lightheadedness developed.  She has since stopped taking that medication but continues to have symptoms.  She did not have significant urine output during the Lasix trial.  Her leg swelling has gone down.  She is not feeling short of breath or having chest discomfort.  Her PCP sent her for a carotid Doppler but results are pending.  No headaches.  No fevers or chills.  No UTI symptoms.   Past Medical History:  Diagnosis Date   Glaucoma    Hypercholesteremia    Hypothyroid    Osteopenia 06/2016   T score -2.4 stable from prior DEXA.    Review of Systems  Constitutional: No fever/chills. Positive generalized weakness.  Cardiovascular: Denies chest pain. Respiratory: Denies shortness of breath. Gastrointestinal: No abdominal pain.  No nausea, no vomiting.  No diarrhea.  Skin: Negative for rash. Neurological: Negative for headaches. ____________________________________________   PHYSICAL EXAM:  VITAL SIGNS: ED Triage Vitals  Encounter Vitals Group     BP 10/20/23 1654 (!) 155/72     Pulse Rate 10/20/23 1654 (!) 102     Resp 10/20/23 1654 20     Temp 10/20/23 1654 99 F (37.2 C)     Temp Source 10/20/23 1654 Oral     SpO2 10/20/23 1654 95 %     Weight 10/20/23 1654 122 lb (55.3 kg)     Height 10/20/23 1654 5' 5 (1.651 m)   Constitutional: Alert and oriented. Well appearing and in no acute distress. Eyes: Conjunctivae are normal. PERRL. EOMI. Head: Atraumatic. Nose: No congestion/rhinnorhea. Mouth/Throat: Mucous membranes are moist.   Oropharynx non-erythematous.  TMs well-visualized bilaterally without effusion or bulging.  Neck: No stridor.   Cardiovascular: Normal rate, regular rhythm. Good peripheral circulation. Grossly normal heart sounds.   Respiratory: Normal respiratory effort.  No retractions. Lungs CTAB. Gastrointestinal: Soft and nontender. No distention.  Musculoskeletal: No lower extremity tenderness with trace edema in the bilateral ankles, worse on the left. No gross deformities of extremities. Neurologic:  Normal speech and language. No gross focal neurologic deficits are appreciated.  Normal finger-to-nose testing.  5/5 strength of bilateral upper and lower extremities.  No facial asymmetry. Skin:  Skin is warm, dry and intact. No rash noted.   ____________________________________________   LABS (all labs ordered are listed, but only abnormal results are displayed)  Labs Reviewed  COMPREHENSIVE METABOLIC PANEL WITH GFR - Abnormal; Notable for the following components:      Result Value   Glucose, Bld 129 (*)    All other components within normal limits  URINALYSIS, ROUTINE W REFLEX MICROSCOPIC - Abnormal; Notable for the following components:   Ketones, ur 5 (*)    Leukocytes,Ua TRACE (*)    Bacteria, UA RARE (*)    All other components within normal limits  CBC  TROPONIN I (HIGH SENSITIVITY)   ____________________________________________  EKG  NSR. NO STEMI.   ____________________________________________   PROCEDURES  Procedure(s) performed:   Procedures  None  ____________________________________________   INITIAL IMPRESSION / ASSESSMENT AND PLAN / ED COURSE  Pertinent  labs & imaging results that were available during my care of the patient were reviewed by me and considered in my medical decision making (see chart for details).   This patient is Presenting for Evaluation of weakness, which does require a range of treatment options, and is a complaint that involves a high risk  of morbidity and mortality.  The Differential Diagnoses include dehydration, kidney injury, anemia, electrolyte abnormality, ACS, etc.  Critical Interventions-    Medications  sodium chloride  0.9 % bolus 500 mL (500 mLs Intravenous New Bag/Given 10/20/23 2109)    Reassessment after intervention: symptoms improved.    Clinical Laboratory Tests Ordered, included CBC without anemia or leukocytosis.  No acute kidney injury. Ua with leukocytes and bacteria. Plan for Keflex.   Medical Decision Making: Summary:  Patient presents emergency department with generalized weakness.  Vital signs are largely unremarkable.  Patient without focal neurodeficits.  No symptoms to strongly suspect vertigo.  Do not plan on neuroimaging although considered.  Plan for troponin, UA, IV fluids and reassess.  Reevaluation with update and discussion with patient. Labs are reassuring. Possible early UTI. Will start Keflex and advise follow up with PCP.   Patient's presentation is most consistent with acute presentation with potential threat to life or bodily function.   Disposition: discharge  ____________________________________________  FINAL CLINICAL IMPRESSION(S) / ED DIAGNOSES  Final diagnoses:  Lightheadedness  Acute cystitis without hematuria     NEW OUTPATIENT MEDICATIONS STARTED DURING THIS VISIT:  New Prescriptions   CEPHALEXIN (KEFLEX) 500 MG CAPSULE    Take 1 capsule (500 mg total) by mouth 2 (two) times daily for 7 days.    Note:  This document was prepared using Dragon voice recognition software and may include unintentional dictation errors.  Fonda Law, MD, [facility]    Miquan Tandon, Fonda MATSU, MD 10/20/23 [phone]

## 2023-10-20 NOTE — Discharge Instructions (Signed)
 Please continue your home medications.  Follow-up with your primary care doctor.  Return with any new or suddenly worsening symptoms.

## 2023-10-20 NOTE — ED Triage Notes (Signed)
 Pt came in for lightheadedness for two weeks. Pt hasn't loss consciousness or doesn't complain of blurred vision.

## 2023-10-24 ENCOUNTER — Other Ambulatory Visit: Payer: Self-pay | Admitting: Family Medicine

## 2023-10-24 ENCOUNTER — Encounter: Payer: Self-pay | Admitting: Family Medicine

## 2023-10-24 ENCOUNTER — Other Ambulatory Visit (HOSPITAL_COMMUNITY): Payer: Self-pay | Admitting: Internal Medicine

## 2023-10-24 ENCOUNTER — Ambulatory Visit (HOSPITAL_COMMUNITY)
Admission: RE | Admit: 2023-10-24 | Discharge: 2023-10-24 | Disposition: A | Discharge status: Home / Self Care | Source: Ambulatory Visit | Attending: Vascular Surgery | Admitting: Vascular Surgery

## 2023-10-24 DIAGNOSIS — R600 Localized edema: Secondary | ICD-10-CM | POA: Diagnosis present

## 2023-10-24 DIAGNOSIS — I38 Endocarditis, valve unspecified: Secondary | ICD-10-CM

## 2023-10-24 DIAGNOSIS — E041 Nontoxic single thyroid nodule: Secondary | ICD-10-CM

## 2023-10-25 ENCOUNTER — Ambulatory Visit (HOSPITAL_COMMUNITY)
Admission: RE | Admit: 2023-10-25 | Discharge: 2023-10-25 | Disposition: A | Discharge status: Home / Self Care | Source: Ambulatory Visit | Attending: Vascular Surgery | Admitting: Vascular Surgery

## 2023-11-06 ENCOUNTER — Ambulatory Visit
Admission: RE | Admit: 2023-11-06 | Discharge: 2023-11-06 | Disposition: A | Discharge status: Home / Self Care | Source: Ambulatory Visit | Attending: Family Medicine | Admitting: Family Medicine

## 2023-11-06 DIAGNOSIS — E041 Nontoxic single thyroid nodule: Secondary | ICD-10-CM

## 2023-11-07 ENCOUNTER — Ambulatory Visit: Attending: Internal Medicine | Admitting: Physical Therapy

## 2023-11-07 ENCOUNTER — Encounter: Payer: Self-pay | Admitting: Physical Therapy

## 2023-11-07 VITALS — BP 141/74 | HR 75

## 2023-11-07 DIAGNOSIS — R42 Dizziness and giddiness: Secondary | ICD-10-CM | POA: Diagnosis present

## 2023-11-07 NOTE — Therapy (Addendum)
 OUTPATIENT PHYSICAL THERAPY VESTIBULAR EVALUATION     Patient Name: Megan Molina MRN: [id number] DOB:Jan 25, 1933, 89 y.o., female Today's Date: 11/07/2023  END OF SESSION:  PT End of Session - 11/07/23 0844     Visit Number 1    Number of Visits 7    Date for PT Re-Evaluation 12/08/23    Authorization Type [employer] MEDICARE    PT Start Time 0845    PT Stop Time 0932    PT Time Calculation (min) 47 min    Equipment Utilized During Treatment Gait belt    Activity Tolerance Patient tolerated treatment well    Behavior During Therapy WFL for tasks assessed/performed          Past Medical History:  Diagnosis Date   Glaucoma    Hypercholesteremia    Hypothyroid    Osteopenia 06/2016   T score -2.4 stable from prior DEXA.   Past Surgical History:  Procedure Laterality Date   ABCESS DRAINAGE     INTESTINAL ABCESS   APPENDECTOMY     BREAST BIOPSY Right    BREAST SURGERY     BIOPSY   cataract surgery     COLONOSCOPY  2013   INGUINAL HERNIA REPAIR Bilateral 09/02/2019   Procedure: LAPAROSCOPIC BILATERAL HERNIA REPAIR, BILATERAL FEMORAL AND LEFT INGUINAL;  Surgeon: Sheldon Standing, MD;  Location: [facility];  Service: General;  Laterality: Bilateral;   Patient Active Problem List   Diagnosis Date Noted   Paroxysmal atrial fibrillation (HCC) 12/21/2022   Hypercoagulable state due to paroxysmal atrial fibrillation (HCC) 12/21/2022   Atrial fibrillation with rapid ventricular response (HCC) 12/09/2022   Hyperlipidemia 12/09/2022   Hypothyroidism 12/09/2022   Incarcerated femoral hernia 09/02/2019   Femoral hernia of right side with obstruction 09/01/2019    PCP: Roanna Ezekiel NOVAK, MD  REFERRING PROVIDER: Roanna Ezekiel NOVAK, MD  REFERRING DIAG: R42 (ICD-10-CM) - Dizziness and giddiness  THERAPY DIAG:  Dizziness and giddiness  ONSET DATE: ~1 month ago  Rationale for Evaluation and Treatment: Rehabilitation  SUBJECTIVE:   SUBJECTIVE STATEMENT: Pt presents to PT initial  evaluation alone. Pt states she drove herself and took Meclizine this morning. Pt denies any dizziness, describing her symptoms more as a lightheadedness that has persisted ever since she took a water  pill about a month ago. Pt states things will go out of focus and happens all throughout the day. Has had a blood testing done, arteries checked, and heart checked and everything came back clear.   Pt accompanied by: self  PERTINENT HISTORY: paroxymal atrial fibrillation that is well-controlled on Cardizem  and is on Eliquis ; glaucoma, osteoporosis, hypothyroidism, HLD    PAIN:  Are you having pain? No  PRECAUTIONS: None  RED FLAGS: None   WEIGHT BEARING RESTRICTIONS: No  FALLS: Has patient fallen in last 6 months? No  LIVING ENVIRONMENT: Lives with: lives in a nursing home Lives in: Other Retirement home Has following equipment at home: None  PLOF: Independent with basic ADLs  PATIENT GOALS: not to be lightheaded anymore  OBJECTIVE:  Note: Objective measures were completed at Evaluation unless otherwise noted.  DIAGNOSTIC FINDINGS: Acute visit on 10/03/23 with lightheadedness, she was not orthostatic and was not dehydrated. EKG shows NSR with PAC, high J-point in V4, otherwise no acute changes. No chest pain or SOB. Follow-up carotid ultrasound for carotid atherosclerosis diagnosed in 2022 has been ordered.   COGNITION: Overall cognitive status: Within functional limits for tasks assessed   SENSATION: WFL  Cervical ROM:  grossly limited  STRENGTH:  WFL  VESTIBULAR ASSESSMENT:  GENERAL OBSERVATION: no AD, no signs of distress or apprehension   SYMPTOM BEHAVIOR:  Subjective history: see above  Non-Vestibular symptoms: changes in vision; near-sided and uses glasses when driving  Type of dizziness: Lightheadedness/Faint  Frequency: daily  Duration: unable to determine; ebbs and flows  Aggravating factors: No known aggravating factors  Relieving factors: no known  relieving factors  Progression of symptoms: better  OCULOMOTOR EXAM:  Ocular Alignment: normal  Ocular ROM: No Limitations  Spontaneous Nystagmus: absent  Gaze-Induced Nystagmus: absent  Smooth Pursuits: intact  Saccades: intact  Convergence/Divergence: 6 cm   VESTIBULAR - OCULAR REFLEX:   Slow VOR: Normal  VOR Cancellation: Normal  Head-Impulse Test: HIT Right: positive (pt with no reported dizziness); HIT Left: negative  Dynamic Visual Acuity: deferred to next session   POSITIONAL TESTING: Right Dix-Hallpike: initially appeared down-beating rotary then appeared apogeotropic; pt denies dizziness with position but had mild dizziness when sitting upright Left Dix-Hallpike: initially appeared rotary that fatigued (unable to determine what direction as pt was closing her eyes); pt reporting a slight dizziness in this position that subsided after ~10 secs Right Roll Test: apogeotropic nystagmus and Duration: >60 secs; asymptomatic Left Roll Test: apogeotropic nystagmus and Duration: >60 secs; asymptomatic Right Sidelying: appeared down-beating and rotary; pt reports feeling slightly dizzy that subsided after ~30 secs Left Sidelying: pt had difficulty getting into the position; appeared to be down-beating rotary apogeotropic *Dix-hallpike performed in modified position using pillow  MOTION SENSITIVITY:  Motion Sensitivity Quotient Intensity: 0 = none, 1 = Lightheaded, 2 = Mild, 3 = Moderate, 4 = Severe, 5 = Vomiting  Intensity  1. Sitting to supine   2. Supine to L side   3. Supine to R side   4. Supine to sitting   5. L Hallpike-Dix   6. Up from L    7. R Hallpike-Dix   8. Up from R    9. Sitting, head tipped to L knee   10. Head up from L knee   11. Sitting, head tipped to R knee   12. Head up from R knee   13. Sitting head turns x5   14.Sitting head nods x5   15. In stance, 180 turn to L    16. In stance, 180 turn to R     OTHOSTATICS: BP WNL                                                                                                                              TREATMENT DATE: 11/07/23 Education on the pathophysiology of BPPV and the role PT plays in management of sx Discussion surrounding the purpose of Meclizine and how it suppresses the vestibular system   PATIENT EDUCATION: Education details: see above, clinical findings, POC Person educated: Patient Education method: Explanation Education comprehension: verbalized understanding and needs further education  HOME EXERCISE PROGRAM: Will provide at future session  GOALS: Goals reviewed  with patient? Yes  SHORT TERM GOALS: All STGs = LTGs    LONG TERM GOALS: Target date: 12/08/23  DVA goal to be written once assessed.  Baseline:  Goal status: INITIAL  2.  Pt will be independent and compliant with final HEP in order to maintain functional progress and improve mobility. Baseline:  Goal status: INITIAL  3.  Patient will demonstrate (-) positional testing to indicate resolution of BPPV. Baseline:  Goal status: INITIAL  ASSESSMENT:  CLINICAL IMPRESSION: Patient is a 89 year old female referred to Neuro OPPT for lightheadedness. Pt's PMH is significant for: paroxymal atrial fibrillation that is well-controlled on Cardizem  and is on Eliquis . The following deficits were present during the exam: limited cervical ROM, guarding with head movements. Pt unable to determine what exactly brings on her self-reported lightheadedness symptoms as well as the duration of her episodes. Pt had taken Meclizine before the PT initial evaluation, thereby implicating the accuracy of PT vestibular assessments/findings. Pt exhibited nystagmus in all positional testing (see above for further details) and will re-assess at next session when pt not on Meclizine. PT instructed pt to not take Meclizine before next session and to not drive. Based on lightheadedness and current functional status, pt falls below  self-reported baseline (prior to ~1 month ago). Pt would benefit from skilled PT to address these aforementioned impairments and functional limitations to improve QoL and return to PLOF.   OBJECTIVE IMPAIRMENTS: decreased knowledge of condition and dizziness.   ACTIVITY LIMITATIONS: bed mobility  PARTICIPATION LIMITATIONS: interpersonal relationship, driving, shopping, and community activity  PERSONAL FACTORS: Age, Past/current experiences, and Time since onset of injury/illness/exacerbation are also affecting patient's functional outcome.   REHAB POTENTIAL: Good  CLINICAL DECISION MAKING: Evolving/moderate complexity  EVALUATION COMPLEXITY: Moderate   PLAN:  PT FREQUENCY: 1-2x/week  PT DURATION: 4 weeks  PLANNED INTERVENTIONS: 97164- PT Re-evaluation, 97750- Physical Performance Testing, 97110-Therapeutic exercises, 97530- Therapeutic activity, 97112- Neuromuscular re-education, 97535- Self Care, [id number]- Manual therapy, [phone]- Gait training, [phone]- Canalith repositioning, Patient/Family education, Balance training, Stair training, Vestibular training, and Visual/preceptual remediation/compensation  PLAN FOR NEXT SESSION: further assessment/treatment for L posterior canalithiasis, initiate HEP - if applicable, DVA testing    Waddell Nailer, Student-PT 11/07/2023, 12:19 PM

## 2023-11-07 NOTE — Addendum Note (Signed)
 Addended by: ANNETT SHEFFIELD SAILOR on: 11/07/2023 03:39 PM   Modules accepted: Orders

## 2023-11-10 ENCOUNTER — Encounter: Payer: Self-pay | Admitting: Physical Therapy

## 2023-11-10 ENCOUNTER — Ambulatory Visit: Payer: Self-pay | Admitting: Physical Therapy

## 2023-11-10 VITALS — BP 167/81 | HR 79

## 2023-11-10 DIAGNOSIS — R42 Dizziness and giddiness: Secondary | ICD-10-CM

## 2023-11-10 NOTE — Therapy (Addendum)
 OUTPATIENT PHYSICAL THERAPY VESTIBULAR TREATMENT     Patient Name: Megan Molina MRN: [id number] DOB:27-Jun-1932, 89 y.o., female Today's Date: 11/10/2023  END OF SESSION:  PT End of Session - 11/10/23 0929     Visit Number 2    Number of Visits 7    Date for PT Re-Evaluation 12/08/23    Authorization Type [employer] MEDICARE    PT Start Time 0930    PT Stop Time 1017    PT Time Calculation (min) 47 min    Equipment Utilized During Treatment Gait belt    Activity Tolerance Patient tolerated treatment well    Behavior During Therapy WFL for tasks assessed/performed          Past Medical History:  Diagnosis Date   Glaucoma    Hypercholesteremia    Hypothyroid    Osteopenia 06/2016   T score -2.4 stable from prior DEXA.   Past Surgical History:  Procedure Laterality Date   ABCESS DRAINAGE     INTESTINAL ABCESS   APPENDECTOMY     BREAST BIOPSY Right    BREAST SURGERY     BIOPSY   cataract surgery     COLONOSCOPY  2013   INGUINAL HERNIA REPAIR Bilateral 09/02/2019   Procedure: LAPAROSCOPIC BILATERAL HERNIA REPAIR, BILATERAL FEMORAL AND LEFT INGUINAL;  Surgeon: Sheldon Standing, MD;  Location: [facility];  Service: General;  Laterality: Bilateral;   Patient Active Problem List   Diagnosis Date Noted   Paroxysmal atrial fibrillation (HCC) 12/21/2022   Hypercoagulable state due to paroxysmal atrial fibrillation (HCC) 12/21/2022   Atrial fibrillation with rapid ventricular response (HCC) 12/09/2022   Hyperlipidemia 12/09/2022   Hypothyroidism 12/09/2022   Incarcerated femoral hernia 09/02/2019   Femoral hernia of right side with obstruction 09/01/2019    PCP: Roanna Ezekiel NOVAK, MD  REFERRING PROVIDER: Roanna Ezekiel NOVAK, MD  REFERRING DIAG: R42 (ICD-10-CM) - Dizziness and giddiness  THERAPY DIAG:  Dizziness and giddiness  ONSET DATE: ~1 month ago  Rationale for Evaluation and Treatment: Rehabilitation  SUBJECTIVE:   SUBJECTIVE STATEMENT: Pt presents to PT treatment  session alone. Pt reports she was driven by a friend. Pt states she felt pretty good the day following the initial evaluation; she has not taken Meclizine since. No other major changes of note.    Pt accompanied by: self  PERTINENT HISTORY: paroxymal atrial fibrillation that is well-controlled on Cardizem  and is on Eliquis ; glaucoma, osteoporosis, hypothyroidism, HLD    PAIN:  Are you having pain? No  PRECAUTIONS: None  RED FLAGS: None   WEIGHT BEARING RESTRICTIONS: No  FALLS: Has patient fallen in last 6 months? No  LIVING ENVIRONMENT: Lives with: lives in a nursing home Lives in: Other Retirement home Has following equipment at home: None  PLOF: Independent with basic ADLs  PATIENT GOALS: not to be lightheaded anymore  OBJECTIVE:  Note: Objective measures were completed at Evaluation unless otherwise noted.  DIAGNOSTIC FINDINGS: Acute visit on 10/03/23 with lightheadedness, she was not orthostatic and was not dehydrated. EKG shows NSR with PAC, high J-point in V4, otherwise no acute changes. No chest pain or SOB. Follow-up carotid ultrasound for carotid atherosclerosis diagnosed in 2022 has been ordered.   COGNITION: Overall cognitive status: Within functional limits for tasks assessed   SENSATION: WFL  Cervical ROM:  grossly limited    STRENGTH:  WFL  VESTIBULAR ASSESSMENT:  GENERAL OBSERVATION: no AD, no signs of distress or apprehension   SYMPTOM BEHAVIOR:  Subjective history: see above  Non-Vestibular symptoms:  changes in vision; near-sided and uses glasses when driving  Type of dizziness: Lightheadedness/Faint  Frequency: daily  Duration: unable to determine; ebbs and flows  Aggravating factors: No known aggravating factors  Relieving factors: no known relieving factors  Progression of symptoms: better  OCULOMOTOR EXAM:  Ocular Alignment: normal  Ocular ROM: No Limitations  Spontaneous Nystagmus: absent  Gaze-Induced Nystagmus: absent  Smooth  Pursuits: intact  Saccades: intact  Convergence/Divergence: 6 cm   VESTIBULAR - OCULAR REFLEX:   Slow VOR: Normal  VOR Cancellation: Normal  Head-Impulse Test: HIT Right: positive (pt with no reported dizziness); HIT Left: negative  Dynamic Visual Acuity: deferred to next session   POSITIONAL TESTING: Right Dix-Hallpike: initially appeared down-beating rotary then appeared apogeotropic; pt denies dizziness with position but had mild dizziness when sitting upright Left Dix-Hallpike: initially appeared rotary that fatigued (unable to determine what direction as pt was closing her eyes); pt reporting a slight dizziness in this position that subsided after ~10 secs Right Roll Test: apogeotropic nystagmus and Duration: >60 secs; asymptomatic Left Roll Test: apogeotropic nystagmus and Duration: >60 secs; asymptomatic Right Sidelying: appeared down-beating and rotary; pt reports feeling slightly dizzy that subsided after ~30 secs Left Sidelying: pt had difficulty getting into the position; appeared to be down-beating rotary apogeotropic *Dix-hallpike performed in modified position using pillow  MOTION SENSITIVITY:  Motion Sensitivity Quotient Intensity: 0 = none, 1 = Lightheaded, 2 = Mild, 3 = Moderate, 4 = Severe, 5 = Vomiting  Intensity  1. Sitting to supine   2. Supine to L side   3. Supine to R side   4. Supine to sitting   5. L Hallpike-Dix   6. Up from L    7. R Hallpike-Dix   8. Up from R    9. Sitting, head tipped to L knee   10. Head up from L knee   11. Sitting, head tipped to R knee   12. Head up from R knee   13. Sitting head turns x5   14.Sitting head nods x5   15. In stance, 180 turn to L    16. In stance, 180 turn to R     OTHOSTATICS: BP WNL                                                                                                                             TREATMENT DATE: 11/10/23  Patient Education / Self-Care: Today's Vitals   11/10/23 0936  11/10/23 0938  BP: (!) 153/59 (!) 167/81  Pulse: 69 79  *sitting (start of session); standing (start of session) Education on self-monitoring BP at home along with keeping a log to provide PCP, especially if SBP is consistently elevated given reports of lightheadedness Pt verbalized understanding and agreeable Education on repetitive rolling HEP PT provided handout; pt verbalized understanding and had no further questions  NMR: R roll: initially rotary nystagmus, then apogeotropic nystagmus >60 seconds, reporting some lightheadedness  L roll:  apogeotropic nystagmus >60 seconds   R DixHallpike: initially appeared to have a rotary component then apogeotropic nystagmus  L DixHallpike: : initially appeared to have a rotary component then apogeotropic nystagmus  Pt reporting an incr dizziness with return to upright  Reassessed DixHallpike with bed in Trendelenburg (4 steps under mat table)  R DixHallpike: no nystagmus noted  L DixHallpike: apogeotropic nystagmus with pt reporting a spinning sensation initially laying back that lasted approx. 5-10 seconds, no rotary nystagmus seen   Performed Gufoni for potential R horizontal cupulolithiasis (as with L DixHallpike pt had apogeotropic nystagmus that pt was symptomatic with and was potentially stimulating the L horizontal canal) Re-assessed with no change in apogeotropic nystagmus afterwards - with R roll test, pt with apogeotropic nystagmus, more mild and reporting some lightheadedness/dizziness, with L roll test, pt with apogeotropic nystagmus that was more noticeable than R side, pt reporting no symptoms on this side   PATIENT EDUCATION: Education details: see above, location of BPPV - suspected horizontal canal Person educated: Patient Education method: Explanation and Handouts Education comprehension: verbalized understanding and needs further education  HOME EXERCISE PROGRAM: Repetitive rolling (11/10/23)  GOALS: Goals reviewed with  patient? Yes  SHORT TERM GOALS: All STGs = LTGs    LONG TERM GOALS: Target date: 12/08/23  DVA goal to be written once assessed.  Baseline:  Goal status: INITIAL  2.  Pt will be independent and compliant with final HEP in order to maintain functional progress and improve mobility. Baseline:  Goal status: INITIAL  3.  Patient will demonstrate (-) positional testing to indicate resolution of BPPV. Baseline:  Goal status: INITIAL  ASSESSMENT:  CLINICAL IMPRESSION: Patient seen for skilled PT session with emphasis on canalith repositioning. She previously potentially had suspected L posterior canal canalithiasis due to pt's symptoms in position. Today, it appears that she has R horizontal canal cupulolithiasis with pt having apogeotropic nystagmus >60 seconds with R and L roll test,with symptoms initially appearing to be worse to the L. Treated with Gufoni x1 with no change in apogeotropic nystagmus afterwards. Pt with severely limited cervical extension and bilat cervical rotation- attempted to be accounted for with bed in trendelenburg, but was minimally effective. Pt given repetitive rolling to trial at home - added to HEP. Continue POC as able.   OBJECTIVE IMPAIRMENTS: decreased knowledge of condition and dizziness.   ACTIVITY LIMITATIONS: bed mobility  PARTICIPATION LIMITATIONS: interpersonal relationship, driving, shopping, and community activity  PERSONAL FACTORS: Age, Past/current experiences, and Time since onset of injury/illness/exacerbation are also affecting patient's functional outcome.   REHAB POTENTIAL: Good  CLINICAL DECISION MAKING: Evolving/moderate complexity  EVALUATION COMPLEXITY: Moderate   PLAN:  PT FREQUENCY: 1-2x/week  PT DURATION: 4 weeks  PLANNED INTERVENTIONS: 97164- PT Re-evaluation, 97750- Physical Performance Testing, 97110-Therapeutic exercises, 97530- Therapeutic activity, 97112- Neuromuscular re-education, 97535- Self Care, 02859- Manual  therapy, [phone]- Gait training, [phone]- Canalith repositioning, Patient/Family education, Balance training, Stair training, Vestibular training, and Visual/preceptual remediation/compensation  PLAN FOR NEXT SESSION: bow and lean test, PT suspects R cupulolithiasis     Waddell Nailer, Student-PT 11/10/2023, 11:28 AM  Sheffield Senate, PT, DPT 11/10/23 1:08 PM

## 2023-11-14 ENCOUNTER — Other Ambulatory Visit: Payer: Self-pay | Admitting: Internal Medicine

## 2023-11-14 DIAGNOSIS — E042 Nontoxic multinodular goiter: Secondary | ICD-10-CM

## 2023-11-15 ENCOUNTER — Encounter: Payer: Self-pay | Admitting: Physical Therapy

## 2023-11-15 ENCOUNTER — Ambulatory Visit: Admitting: Physical Therapy

## 2023-11-15 VITALS — BP 169/77 | HR 80

## 2023-11-15 DIAGNOSIS — R42 Dizziness and giddiness: Secondary | ICD-10-CM | POA: Diagnosis not present

## 2023-11-15 NOTE — Therapy (Signed)
 OUTPATIENT PHYSICAL THERAPY VESTIBULAR TREATMENT     Patient Name: Megan Molina MRN: [id number] DOB:[birth year], 89 y.o., female Today's Date: 11/15/2023  END OF SESSION:  PT End of Session - 11/15/23 0846     Visit Number 3    Number of Visits 7    Date for PT Re-Evaluation 12/08/23    Authorization Type [employer]ICARE    Authorization Time Period Approved 7 PT visits 11/07/2023 - 12/08/2023    PT Start Time 0846    PT Stop Time 0926    PT Time Calculation (min) 40 min    Equipment Utilized During Treatment --    Activity Tolerance Patient tolerated treatment well   limited by lightheadedness   Behavior During Therapy WFL for tasks assessed/performed          Past Medical History:  Diagnosis Date   Glaucoma    Hypercholesteremia    Hypothyroid    Osteopenia 06/2016   T score -2.4 stable from prior DEXA.   Past Surgical History:  Procedure Laterality Date   ABCESS DRAINAGE     INTESTINAL ABCESS   APPENDECTOMY     BREAST BIOPSY Right    BREAST SURGERY     BIOPSY   cataract surgery     COLONOSCOPY  2013   INGUINAL HERNIA REPAIR Bilateral 09/02/2019   Procedure: LAPAROSCOPIC BILATERAL HERNIA REPAIR, BILATERAL FEMORAL AND LEFT INGUINAL;  Surgeon: Sheldon Standing, MD;  Location: [facility];  Service: General;  Laterality: Bilateral;   Patient Active Problem List   Diagnosis Date Noted   Paroxysmal atrial fibrillation (HCC) 12/21/2022   Hypercoagulable state due to paroxysmal atrial fibrillation (HCC) 12/21/2022   Atrial fibrillation with rapid ventricular response (HCC) 12/09/2022   Hyperlipidemia 12/09/2022   Hypothyroidism 12/09/2022   Incarcerated femoral hernia 09/02/2019   Femoral hernia of right side with obstruction 09/01/2019    PCP: Roanna Ezekiel NOVAK, MD  REFERRING PROVIDER: Roanna Ezekiel NOVAK, MD  REFERRING DIAG: R42 (ICD-10-CM) - Dizziness and giddiness  THERAPY DIAG:  Dizziness and giddiness  ONSET DATE: ~1 month ago  Rationale for Evaluation and  Treatment: Rehabilitation  SUBJECTIVE:   SUBJECTIVE STATEMENT: Feeling pretty lightheaded this morning. Tried the repeated rolling at home, seemed to have more dizziness when she was on her R ear and sometimes it kicked in on the L. Notes the dizziness is about the same, no change. Has not taken Meclizine.   Pt accompanied by: self  PERTINENT HISTORY: paroxymal atrial fibrillation that is well-controlled on Cardizem  and is on Eliquis ; glaucoma, osteoporosis, hypothyroidism, HLD    PAIN:  Are you having pain? No  PRECAUTIONS: None  RED FLAGS: None   WEIGHT BEARING RESTRICTIONS: No  FALLS: Has patient fallen in last 6 months? No  LIVING ENVIRONMENT: Lives with: lives in a nursing home Lives in: Other Retirement home Has following equipment at home: None  PLOF: Independent with basic ADLs  PATIENT GOALS: not to be lightheaded anymore  OBJECTIVE:  Note: Objective measures were completed at Evaluation unless otherwise noted.  DIAGNOSTIC FINDINGS: Acute visit on 10/03/23 with lightheadedness, she was not orthostatic and was not dehydrated. EKG shows NSR with PAC, high J-point in V4, otherwise no acute changes. No chest pain or SOB. Follow-up carotid ultrasound for carotid atherosclerosis diagnosed in 2022 has been ordered.   COGNITION: Overall cognitive status: Within functional limits for tasks assessed   SENSATION: WFL  Cervical ROM:  grossly limited    STRENGTH:  WFL  VESTIBULAR ASSESSMENT:  GENERAL OBSERVATION:  no AD, no signs of distress or apprehension   SYMPTOM BEHAVIOR:  Subjective history: see above  Non-Vestibular symptoms: changes in vision; near-sided and uses glasses when driving  Type of dizziness: Lightheadedness/Faint  Frequency: daily  Duration: unable to determine; ebbs and flows  Aggravating factors: No known aggravating factors  Relieving factors: no known relieving factors  Progression of symptoms: better  OCULOMOTOR EXAM:  Ocular  Alignment: normal  Ocular ROM: No Limitations  Spontaneous Nystagmus: absent  Gaze-Induced Nystagmus: absent  Smooth Pursuits: intact  Saccades: intact  Convergence/Divergence: 6 cm   VESTIBULAR - OCULAR REFLEX:   Slow VOR: Normal  VOR Cancellation: Normal  Head-Impulse Test: HIT Right: positive (pt with no reported dizziness); HIT Left: negative  Dynamic Visual Acuity: deferred to next session   POSITIONAL TESTING: Right Dix-Hallpike: initially appeared down-beating rotary then appeared apogeotropic; pt denies dizziness with position but had mild dizziness when sitting upright Left Dix-Hallpike: initially appeared rotary that fatigued (unable to determine what direction as pt was closing her eyes); pt reporting a slight dizziness in this position that subsided after ~10 secs Right Roll Test: apogeotropic nystagmus and Duration: >60 secs; asymptomatic Left Roll Test: apogeotropic nystagmus and Duration: >60 secs; asymptomatic Right Sidelying: appeared down-beating and rotary; pt reports feeling slightly dizzy that subsided after ~30 secs Left Sidelying: pt had difficulty getting into the position; appeared to be down-beating rotary apogeotropic *Dix-hallpike performed in modified position using pillow  MOTION SENSITIVITY:  Motion Sensitivity Quotient Intensity: 0 = none, 1 = Lightheaded, 2 = Mild, 3 = Moderate, 4 = Severe, 5 = Vomiting  Intensity  1. Sitting to supine   2. Supine to L side   3. Supine to R side   4. Supine to sitting   5. L Hallpike-Dix   6. Up from L    7. R Hallpike-Dix   8. Up from R    9. Sitting, head tipped to L knee   10. Head up from L knee   11. Sitting, head tipped to R knee   12. Head up from R knee   13. Sitting head turns x5   14.Sitting head nods x5   15. In stance, 180 turn to L    16. In stance, 180 turn to R                                                                                                                                 TREATMENT DATE: 11/15/23  Patient Education / Self-Care: Today's Vitals   11/15/23 0850 11/15/23 0852  BP: (!) 160/76 (!) 169/77  Pulse: 72 80  *sitting (start of session); standing (start of session) Pt reports that her lightheadedness did not incr upon standing. Pt reports that her BP has been more elevated at therapy than at home.  Pt does not currently take any BP medications.     Education on self-monitoring BP at home along with keeping a  log to provide PCP, especially if SBP is consistently elevated given reports of lightheadedness, pt verbalized understanding   NMR:  POSITIONAL TESTING: Right Dix-Hallpike: pt reporting some things were moving, appeared slightly rotary? But also looking potentially apogeotropic, was hard to discern  Left Dix-Hallpike: upbeating, left nystagmus and appeared to be brief upbeating and rotary with pt reporting some of the room was moving, lasting approx 30 seconds   Right Roll Test: apogeotropic nystagmus and lasting >60 seconds, pt reporting that she saw things moving  Left Roll Test: no nystagmus and pt reporting no dizziness and nothing was moving  Pt reports that she has hx of double vision, so re-checked roll test each side with pt's glasses on with no changes noted   VESTIBULAR TREATMENT:  Canalith Repositioning: Epley Left: Number of Reps: 1, Response to Treatment: symptoms improved, and Comment: assessed 2 reps after Epley, with no nystagmus noted and pt not reporting dizziness/room moving in DixHallpike testing position, but did report some brief dizziness/felt like vision got funny when returning back to sitting   At end of session pt reporting feeling more lightheadedness, needing CGA when ambulating to restroom, pt has someone to call to drive her home    **DixHallpike and Epley perform with 4 steps under mat table to assist with extension   PATIENT EDUCATION: Education details: See self-care, BPPV education, continue repeated  rolling at home  Person educated: Patient Education method: Explanation and Handouts Education comprehension: verbalized understanding and needs further education  HOME EXERCISE PROGRAM: Repetitive rolling (11/10/23)  GOALS: Goals reviewed with patient? Yes  SHORT TERM GOALS: All STGs = LTGs    LONG TERM GOALS: Target date: 12/08/23  DVA goal to be written once assessed.  Baseline:  Goal status: INITIAL  2.  Pt will be independent and compliant with final HEP in order to maintain functional progress and improve mobility. Baseline:  Goal status: INITIAL  3.  Patient will demonstrate (-) positional testing to indicate resolution of BPPV. Baseline:  Goal status: INITIAL   ASSESSMENT:  CLINICAL IMPRESSION: Pt's BP still elevated, but WNL to participate in therapy. Encouraged pt to keep monitoring it at home and to let PCP know of readings as pt not on BP medication. Re-assessed positional testing today with pt with continued apogeotropic nystagmus in R roll test lasting >60 seconds with pt reporting things moving. With L roll test, pt had no nystagmus or symptoms (which was improved from last week). Assessed DixHallpike with 4 risers under mat table to help with extension, with L DixHallpike, pt initially appeared to have L rotary upbeating rotary nystagmus with pt reporting the room was moving for approx 30 seconds. Treated with 1 rep of the Epley maneuver. With re-assessment, pt with no dizziness/nystagmus in L DixHallpike, but pt did report incr dizziness with return to sitting. Pt reporting feeling more of the lightheaded sensation at the end of session, but did have a ride home. Pt's BPPV appears to be multi-canal? But difficult to discern. Encouraged pt to continue working on repeated rolling at home. Will continue per POC as able.    OBJECTIVE IMPAIRMENTS: decreased knowledge of condition and dizziness.   ACTIVITY LIMITATIONS: bed mobility  PARTICIPATION LIMITATIONS:  interpersonal relationship, driving, shopping, and community activity  PERSONAL FACTORS: Age, Past/current experiences, and Time since onset of injury/illness/exacerbation are also affecting patient's functional outcome.   REHAB POTENTIAL: Good  CLINICAL DECISION MAKING: Evolving/moderate complexity  EVALUATION COMPLEXITY: Moderate   PLAN:  PT FREQUENCY: 1-2x/week  PT  DURATION: 4 weeks  PLANNED INTERVENTIONS: 97164- PT Re-evaluation, 97750- Physical Performance Testing, 97110-Therapeutic exercises, 97530- Therapeutic activity, [id number]- Neuromuscular re-education, 97535- Self Care, 02859- Manual therapy, [phone]- Gait training, [phone]- Canalith repositioning, Patient/Family education, Balance training, Stair training, Vestibular training, and Visual/preceptual remediation/compensation  PLAN FOR NEXT SESSION: check BP, re-check all canals, appeared to be L posterior canalithiasis and potentially L horizontal cupulo? Due to worse symptoms on R side.  Re-check things as able, if PT is not helping, would need referral back to PCP and potential referral to ENT   Sheffield Senate, PT, DPT 11/15/23 9:29 AM

## 2023-11-17 ENCOUNTER — Encounter

## 2023-11-17 ENCOUNTER — Other Ambulatory Visit: Payer: Self-pay | Admitting: Internal Medicine

## 2023-11-17 DIAGNOSIS — R42 Dizziness and giddiness: Secondary | ICD-10-CM

## 2023-11-22 ENCOUNTER — Ambulatory Visit

## 2023-11-22 NOTE — Telephone Encounter (Signed)
 Megan Cassis Spainhour, PA-C  Megan Molina, CMA Megan, please contact patient and let her know I have reviewed her test results from yesterday:  1) she shows hearing loss in both ears.  She would be a candidate for hearing aids if she wishes to pursue that option.  2) the Dix-Hallpike testing was negative.  She may continue to work with her local physical therapy to see if her dizziness improves.  3) I would be happy to refer her to the main [facility] facility for further vestibular evaluation.  Please let me know if she wishes for me to make that referral or if there are any other questions.  Thank you.

## 2023-11-22 NOTE — Telephone Encounter (Signed)
 Patient c/o of blurry vision, and issues of dizziness which has not subsided. Wants to proceed with referral to main [facility] for further vestibular evaluation. Patient is not wanting to get hearing aids at this time due to dizzy spells and will contact her Physical therapist to see if this will help with her case. Please send referral ASAP as patient is in need.

## 2023-11-23 ENCOUNTER — Other Ambulatory Visit: Payer: Self-pay

## 2023-11-23 ENCOUNTER — Encounter: Payer: Medicare PPO | Admitting: Radiology

## 2023-11-23 ENCOUNTER — Emergency Department (HOSPITAL_COMMUNITY)

## 2023-11-23 ENCOUNTER — Emergency Department (HOSPITAL_COMMUNITY)
Admission: EM | Admit: 2023-11-23 | Discharge: 2023-11-23 | Disposition: A | Discharge status: Home / Self Care | Attending: Emergency Medicine | Admitting: Emergency Medicine

## 2023-11-23 ENCOUNTER — Encounter (HOSPITAL_COMMUNITY): Payer: Self-pay

## 2023-11-23 DIAGNOSIS — R42 Dizziness and giddiness: Secondary | ICD-10-CM | POA: Diagnosis present

## 2023-11-23 DIAGNOSIS — Z79899 Other long term (current) drug therapy: Secondary | ICD-10-CM | POA: Insufficient documentation

## 2023-11-23 DIAGNOSIS — H8193 Unspecified disorder of vestibular function, bilateral: Secondary | ICD-10-CM | POA: Diagnosis not present

## 2023-11-23 DIAGNOSIS — Z7901 Long term (current) use of anticoagulants: Secondary | ICD-10-CM | POA: Diagnosis not present

## 2023-11-23 LAB — COMPREHENSIVE METABOLIC PANEL WITH GFR
ALT: 27 U/L (ref 0–44)
AST: 29 U/L (ref 15–41)
Albumin: 4.6 g/dL (ref 3.5–5.0)
Alkaline Phosphatase: 48 U/L (ref 38–126)
Anion gap: 10 (ref 5–15)
BUN: 13 mg/dL (ref 8–23)
CO2: 26 mmol/L (ref 22–32)
Calcium: 9.7 mg/dL (ref 8.9–10.3)
Chloride: 103 mmol/L (ref 98–111)
Creatinine, Ser: 0.73 mg/dL (ref 0.44–1.00)
GFR, Estimated: >60 mL/min (ref >60)
Glucose, Bld: 92 mg/dL (ref 70–99)
Potassium: 3.5 mmol/L (ref 3.5–5.1)
Sodium: 139 mmol/L (ref 135–145)
Total Bilirubin: 1.4 mg/dL — ABNORMAL HIGH (ref 0.0–1.2)
Total Protein: 7.8 g/dL (ref 6.5–8.1)

## 2023-11-23 LAB — CBC
HCT: 49.7 % — ABNORMAL HIGH (ref 36.0–46.0)
Hemoglobin: 15.8 g/dL — ABNORMAL HIGH (ref 12.0–15.0)
MCH: 29.6 pg (ref 26.0–34.0)
MCHC: 31.8 g/dL (ref 30.0–36.0)
MCV: 93.2 fL (ref 80.0–100.0)
Platelets: 276 K/uL (ref 150–400)
RBC: 5.33 MIL/uL — ABNORMAL HIGH (ref 3.87–5.11)
RDW: 12.5 % (ref 11.5–15.5)
WBC: 8.5 K/uL (ref 4.0–10.5)
nRBC: 0 % (ref 0.0–0.2)

## 2023-11-23 LAB — RAPID URINE DRUG SCREEN, HOSP PERFORMED
Amphetamines: NOT DETECTED
Barbiturates: NOT DETECTED
Benzodiazepines: NOT DETECTED
Cocaine: NOT DETECTED
Opiates: NOT DETECTED
Tetrahydrocannabinol: NOT DETECTED

## 2023-11-23 LAB — DIFFERENTIAL
Abs Immature Granulocytes: 0.02 K/uL (ref 0.00–0.07)
Basophils Absolute: 0.1 K/uL (ref 0.0–0.1)
Basophils Relative: 1 %
Eosinophils Absolute: 0 K/uL (ref 0.0–0.5)
Eosinophils Relative: 0 %
Immature Granulocytes: 0 %
Lymphocytes Relative: 27 %
Lymphs Abs: 2.3 K/uL (ref 0.7–4.0)
Monocytes Absolute: 0.5 K/uL (ref 0.1–1.0)
Monocytes Relative: 6 %
Neutro Abs: 5.6 K/uL (ref 1.7–7.7)
Neutrophils Relative %: 66 %

## 2023-11-23 LAB — ETHANOL: Alcohol, Ethyl (B): <15 mg/dL (ref <15)

## 2023-11-23 LAB — PROTIME-INR
INR: 1.1 (ref 0.8–1.2)
Prothrombin Time: 14.4 s (ref 11.4–15.2)

## 2023-11-23 LAB — APTT: aPTT: 37 s — ABNORMAL HIGH (ref 24–36)

## 2023-11-23 MED ORDER — MECLIZINE HCL 12.5 MG PO TABS: 12.5000 mg | ORAL_TABLET | Freq: Three times a day (TID) | ORAL | 0 refills | Status: DC

## 2023-11-23 MED ORDER — SODIUM CHLORIDE 0.9 % IV SOLN
100.0000 mL/h | INTRAVENOUS | Status: DC
  Administered 2023-11-23: 100 mL/h via INTRAVENOUS

## 2023-11-23 MED ORDER — SODIUM CHLORIDE 0.9 % IV BOLUS
500.0000 mL | Freq: Once | INTRAVENOUS | Status: AC
  Administered 2023-11-23: 500 mL via INTRAVENOUS

## 2023-11-23 MED ORDER — MECLIZINE HCL 25 MG PO TABS
25.0000 mg | ORAL_TABLET | Freq: Once | ORAL | Status: AC
  Administered 2023-11-23: 25 mg via ORAL
  Filled 2023-11-23: qty 1

## 2023-11-23 NOTE — Discharge Instructions (Addendum)
 As discussed, today's emergency department evaluation has been generally reassuring.  However, with your ongoing lightheadedness/dizziness it is important to continue working with your primary care physician and our neurology team.  A referral has been placed to neurology and they will contact you tomorrow or Monday for follow-up.  In the interim, please take meclizine , 12.5 mg 3 times daily.  Return here or to [facility] for concerning changes in your condition.

## 2023-11-23 NOTE — ED Triage Notes (Signed)
 Pt reports with dizziness and blurred vision for over a month. Pt reports being dx with vertigo but is not taking her medication anymore. Pt denies chest pain and SHOB.

## 2023-11-23 NOTE — ED Provider Notes (Signed)
 [place] EMERGENCY DEPARTMENT AT [facility] Provider Note   CSN: [id number] Arrival date & time: 11/23/23  0900     Patient presents with: Dizziness   Megan Molina is a 89 y.o. female.   HPI Patient presents with her son and a family friend who assists with the history.  She notes ongoing dizziness described as lightheadedness/instability for approximately 2 months, worse for the past 2 days.  During this evaluation she has seen her primary care physician, ENT, has had PT with Dix-Hallpike maneuvers.  She notes that her evaluation thus far also includes ultrasound carotid arteries, cardiac evaluation, and she was recently prescribed outpatient MRI with anticipated neuro follow-up.  She has been inconsistently using meclizine , though notes that it has some improvement when she is taking it regularly.  Today, she has had worsening symptoms since yesterday prompting ED visit.    Prior to Admission medications   Medication Sig Start Date End Date Taking? Authorizing Provider  apixaban  (ELIQUIS ) 2.5 MG TABS tablet Take 1 tablet (2.5 mg total) by mouth 2 (two) times daily. 01/18/23   Terra Fairy PARAS, PA-C  Ascorbic Acid  (VITAMIN C PO) Take 1 tablet by mouth 3 (three) times a week.    [provider]  Calcium  Carb-Cholecalciferol (CALCIUM  500 + D3 PO) Take 500 mg by mouth daily.    [provider]  Cholecalciferol (VITAMIN D PO) Take 2,000 Units by mouth.    [provider]  ciclopirox  (PENLAC ) 8 % solution Apply topically at bedtime. Apply over nail and surrounding skin. Apply daily over previous coat. Remove weekly with polish remover. 02/15/23   McCaughan, Dia D, DPM  Cyanocobalamin (VITAMIN B-12 PO) Take 1 tablet by mouth every 3 (three) days.    [provider]  diltiazem  (CARDIZEM  CD) 120 MG 24 hr capsule Take 1 capsule (120 mg total) by mouth daily. 10/18/23   Terra Fairy PARAS, PA-C  dorzolamide  (TRUSOPT ) 2 % ophthalmic solution Place 1 drop  into both eyes 2 (two) times daily.     [provider]  ibandronate  (BONIVA ) 150 MG tablet TAKE 1 TABLET BY MOUTH EVERY 30 DAYS IN THE MORNING, WITH FULL GLASS OF WATER  ON AN EMPTY STOMACH. NOTHING BY MOUTH OR LIE DOWN FOR NEXT 30 11/17/22   Chrzanowski, Jami B, NP  levothyroxine  (SYNTHROID ) 25 MCG tablet Take 25 mcg by mouth every other day. T,TH,SAT,SUN 06/07/19   [provider]  levothyroxine  (SYNTHROID ) 50 MCG tablet Take 50 mcg by mouth every other day. M,W,F    [provider]  OMEGA-3 FATTY ACIDS PO Take 1 tablet by mouth every 3 (three) days.    [provider]  rosuvastatin  (CRESTOR ) 10 MG tablet Take 10 mg by mouth daily. 04/08/21   [provider]  tretinoin (RETIN-A) 0.05 % cream Apply topically at bedtime. 06/14/22   [provider]    Allergies: Sulfa antibiotics    Review of Systems  Updated Vital Signs BP (!) 146/63   Pulse 73   Temp 98.2 F (36.8 C)   Resp 17   SpO2 96%   Physical Exam Vitals and nursing note reviewed.  Constitutional:      General: She is not in acute distress.    Appearance: She is well-developed.  HENT:     Head: Normocephalic and atraumatic.  Eyes:     Conjunctiva/sclera: Conjunctivae normal.  Cardiovascular:     Rate and Rhythm: Normal rate. Rhythm irregular.  Pulmonary:     Effort:  Pulmonary effort is normal. No respiratory distress.     Breath sounds: Normal breath sounds. No stridor.  Abdominal:     General: There is no distension.  Skin:    General: Skin is warm and dry.  Neurological:     General: No focal deficit present.     Mental Status: She is alert and oriented to person, place, and time.     Cranial Nerves: No cranial nerve deficit.     Motor: Atrophy present.  Psychiatric:        Mood and Affect: Mood normal.     (all labs ordered are listed, but only abnormal results are displayed) Labs Reviewed  APTT - Abnormal; Notable for the following components:       Result Value   aPTT 37 (*)    All other components within normal limits  CBC - Abnormal; Notable for the following components:   RBC 5.33 (*)    Hemoglobin 15.8 (*)    HCT 49.7 (*)    All other components within normal limits  COMPREHENSIVE METABOLIC PANEL WITH GFR - Abnormal; Notable for the following components:   Total Bilirubin 1.4 (*)    All other components within normal limits  PROTIME-INR  DIFFERENTIAL  ETHANOL  RAPID URINE DRUG SCREEN, HOSP PERFORMED    EKG: 92 sinus with PAC, artifact, wander, abnormal  Radiology: MR BRAIN WO CONTRAST Result Date: 11/23/2023 CLINICAL DATA:  Provided history: Neuro deficit, acute, stroke suspected. New gait difficulty with dizziness. EXAM: MRI HEAD WITHOUT CONTRAST TECHNIQUE: Multiplanar, multiecho pulse sequences of the brain and surrounding structures were obtained without intravenous contrast. COMPARISON:  None. FINDINGS: Brain: Generalized cerebral atrophy. Minimal periventricular T2 FLAIR hyperintense signal abnormality which is nonspecific, but consistent with chronic small vessel ischemic disease. No cortical encephalomalacia is identified. There is no acute infarct. No evidence of an intracranial mass. No chronic intracranial blood products. No extra-axial fluid collection. No midline shift. Vascular: Maintained flow voids within the proximal large arterial vessels. Skull and upper cervical spine: No focal worrisome marrow lesion. Incompletely assessed cervical spondylosis. Sinuses/Orbits: No mass or acute finding within the imaged orbits. Prior bilateral ocular lens replacement. No significant paranasal sinus disease. IMPRESSION: 1.  No evidence of an acute intracranial abnormality. 2. Minor chronic small vessel ischemic changes within the cerebral white matter. 3. Generalized cerebral atrophy. Electronically Signed   By: Rockey Childs D.O.   On: 11/23/2023 13:55     Procedures   Medications Ordered in the ED  sodium chloride  0.9 % bolus  500 mL (500 mLs Intravenous New Bag/Given 11/23/23 1204)    Followed by  0.9 %  sodium chloride  infusion (100 mL/hr Intravenous New Bag/Given 11/23/23 1208)  meclizine  (ANTIVERT ) tablet 25 mg (25 mg Oral Given 11/23/23 1201)                                    Medical Decision Making Patient with worsening dizziness in the context of ongoing evaluation for this.  With worsening symptoms in spite of appropriate evaluation, differential includes remaining items for disequilibrium/dizziness/lightheadedness including stroke, medication effect, arrhythmia but is symptomatic beyond her baseline A-fib. Cardiac 90s sinus rhythm frequent PAC abnormal pulse ox 95% room air normal  Amount and/or Complexity of Data Reviewed Independent Historian:     Details: Family and friend External Data Reviewed: notes.    Details: [facility] notes reviewed Labs: ordered. Decision-making details documented in  ED Course. Radiology: ordered and independent interpretation performed. Decision-making details documented in ED Course. ECG/medicine tests: ordered and independent interpretation performed. Decision-making details documented in ED Course.  Risk Prescription drug management. Decision regarding hospitalization. Diagnosis or treatment significantly limited by social determinants of health.   2:30 PM Patient, family and I discussed all findings including MRI results.  MRI without acute stroke, mass, labs unremarkable, noncontributory.  Given her prior evaluation as documented above, some consideration of vestibular dysfunction versus vestibulitis given persistent symptoms.  Conversation about this, the possibility, other possibilities and importance of following up with neurology.  Patient will start meclizine  3 times daily, have ambulatory referral to neurology, is discharged in stable condition.    Final diagnoses:  Vestibular dysfunction of both ears    ED Discharge Orders     None           Garrick Charleston, MD 11/23/23 1434

## 2023-11-23 NOTE — ED Notes (Signed)
 Patient transported to MRI

## 2023-11-24 ENCOUNTER — Encounter

## 2023-11-28 ENCOUNTER — Encounter: Admitting: Physical Therapy

## 2023-11-29 ENCOUNTER — Inpatient Hospital Stay: Admission: RE | Admit: 2023-11-29 | Source: Ambulatory Visit

## 2023-11-30 ENCOUNTER — Telehealth: Payer: Self-pay | Admitting: Neurology

## 2023-11-30 ENCOUNTER — Ambulatory Visit: Admitting: Neurology

## 2023-11-30 ENCOUNTER — Encounter: Payer: Self-pay | Admitting: Neurology

## 2023-11-30 VITALS — BP 128/84 | HR 84 | Ht 65.0 in | Wt 121.5 lb

## 2023-11-30 DIAGNOSIS — R42 Dizziness and giddiness: Secondary | ICD-10-CM | POA: Diagnosis not present

## 2023-11-30 MED ORDER — MECLIZINE HCL 25 MG PO TABS: 25.0000 mg | ORAL_TABLET | Freq: Three times a day (TID) | ORAL | 0 refills | Status: AC | PRN

## 2023-11-30 NOTE — Telephone Encounter (Signed)
 Referral for Physical  Therapy faxed to [facility])  [facility]) Phone [phone] Fax [phone]

## 2023-11-30 NOTE — Patient Instructions (Signed)
 Increase fluid intake  Continue with Meclizine  25 mg three time daily  Continue your other medications  Referral to vestibular rehab  Continue to follow up with PCP  Return as needed

## 2023-11-30 NOTE — Progress Notes (Signed)
 [facility]  PATIENT: Megan Molina DOB: 1932-08-17  REQUESTING CLINICIAN: Garrick Charleston, MD HISTORY FROM: Patient/Son  REASON FOR VISIT: Dizziness    HISTORICAL  CHIEF COMPLAINT:  Chief Complaint  Patient presents with   Dizziness    Room 12 New patient  Notice some dizziness and feeling light head about 5 weeks ago     HISTORY OF PRESENT ILLNESS:  Discussed the use of AI scribe software for clinical note transcription with the patient, who gave verbal consent to proceed.  Megan Molina is an 89 year old female with history of hypothyroidism, anxiety, hyperlipidemia, heart disease who presents with dizziness and lightheadedness.  She has been experiencing dizziness and lightheadedness for the past five weeks, describing the sensation as things being 'a little bit unfocused' rather than true vertigo, with no spinning or movement of the environment. Initially, she was able to function despite the lightheadedness, but her symptoms have worsened after completing vestibular therapy. Her symptoms are not consistently triggered by movement, although she experienced a 'funny feeling' when putting on a sweater. She describes a sensation of having 'water  in her head' and feels like she is 'under water ,' which she attributes to a muffling sensation. No recent ear infections, but she is hard of hearing.  She has undergone a comprehensive workup including tests on her carotid artery, heart, and an MRI of the brain, all of which returned negative results. She also visited an ENT specialist who noted some hearing loss and conducted a hearing test, which was negative for any significant findings. She attended physical therapy three times, but after the third session, her symptoms worsened, therefore she stopped going.   Currently, she is taking meclizine  12.5 mg three times a day, which she has been using for about a month. The medication does not make her drowsy and provides some  relief. Her vision is slightly off, but this is not a new symptom. She can read with her readers but finds her vision 'a little bit off' without them.  Her fluid intake is approximately 30 to 32 ounces per day, including water  and Gatorade. She lives alone in a retirement home but has family support from her sons and daughter who can visit as needed. She has a history of being a 'wobbly' walker for about a year and is unable to walk in a straight line, which she could do before her symptoms worsened. She has been performing exercises at home.      OTHER MEDICAL CONDITIONS: Hypothyroidism, hyperlipidemia, heart disease    REVIEW OF SYSTEMS: Full 14 system review of systems performed and negative with exception of: as noted in the HPI    ALLERGIES: Allergies  Allergen Reactions   Sulfa Antibiotics Rash    HOME MEDICATIONS: Outpatient Medications Prior to Visit  Medication Sig Dispense Refill   apixaban  (ELIQUIS ) 2.5 MG TABS tablet Take 1 tablet (2.5 mg total) by mouth 2 (two) times daily. 180 tablet 2   Ascorbic Acid  (VITAMIN C PO) Take 1 tablet by mouth 3 (three) times a week.     Calcium  Carb-Cholecalciferol (CALCIUM  500 + D3 PO) Take 500 mg by mouth daily.     Cholecalciferol (VITAMIN D PO) Take 2,000 Units by mouth.     ciclopirox  (PENLAC ) 8 % solution Apply topically at bedtime. Apply over nail and surrounding skin. Apply daily over previous coat. Remove weekly with polish remover. 6.6 mL 11   Cyanocobalamin (VITAMIN B-12 PO) Take 1 tablet by mouth every 3 (three)  days.     diltiazem  (CARDIZEM  CD) 120 MG 24 hr capsule Take 1 capsule (120 mg total) by mouth daily. 90 capsule 9   dorzolamide  (TRUSOPT ) 2 % ophthalmic solution Place 1 drop into both eyes 2 (two) times daily.      ibandronate  (BONIVA ) 150 MG tablet TAKE 1 TABLET BY MOUTH EVERY 30 DAYS IN THE MORNING, WITH FULL GLASS OF WATER  ON AN EMPTY STOMACH. NOTHING BY MOUTH OR LIE DOWN FOR NEXT 30 3 tablet 4   levothyroxine  (SYNTHROID )  25 MCG tablet Take 25 mcg by mouth every other day. T,TH,SAT,SUN     levothyroxine  (SYNTHROID ) 50 MCG tablet Take 50 mcg by mouth every other day. M,W,F     OMEGA-3 FATTY ACIDS PO Take 1 tablet by mouth every 3 (three) days.     rosuvastatin  (CRESTOR ) 10 MG tablet Take 10 mg by mouth daily.     tretinoin (RETIN-A) 0.05 % cream Apply topically at bedtime.     meclizine  (ANTIVERT ) 12.5 MG tablet Take 1 tablet (12.5 mg total) by mouth 3 (three) times daily. 30 tablet 0   No facility-administered medications prior to visit.    PAST MEDICAL HISTORY: Past Medical History:  Diagnosis Date   Glaucoma    Hypercholesteremia    Hypothyroid    Osteopenia 06/2016   T score -2.4 stable from prior DEXA.    PAST SURGICAL HISTORY: Past Surgical History:  Procedure Laterality Date   ABCESS DRAINAGE     INTESTINAL ABCESS   APPENDECTOMY     BREAST BIOPSY Right    BREAST SURGERY     BIOPSY   cataract surgery     COLONOSCOPY  2013   INGUINAL HERNIA REPAIR Bilateral 09/02/2019   Procedure: LAPAROSCOPIC BILATERAL HERNIA REPAIR, BILATERAL FEMORAL AND LEFT INGUINAL;  Surgeon: Sheldon Standing, MD;  Location: [facility];  Service: General;  Laterality: Bilateral;    FAMILY HISTORY: Family History  Problem Relation Age of Onset   Pancreatic cancer Mother    Colon cancer Neg Hx    Stomach cancer Neg Hx     SOCIAL HISTORY: Social History   Socioeconomic History   Marital status: Widowed    Spouse name: Not on file   Number of children: 3   Years of education: Not on file   Highest education level: Not on file  Occupational History   Not on file  Tobacco Use   Smoking status: Former    Current packs/day: 0.00    Types: Cigarettes    Quit date: 04/25/1988    Years since quitting: 35.6   Smokeless tobacco: Never  Vaping Use   Vaping status: Never Used  Substance and Sexual Activity   Alcohol use: Not Currently    Alcohol/week: 7.0 standard drinks of alcohol    Types: 7 Standard drinks or  equivalent per week   Drug use: No   Sexual activity: Not Currently    Partners: Male    Birth control/protection: Post-menopausal    Comment: 1st intercourse 89 yo-Fewer than 5 partners, answered no to all medicare screening questions  Other Topics Concern   Not on file  Social History Narrative   Not on file   Social Drivers of Health   Financial Resource Strain: Not on file  Food Insecurity: No Food Insecurity (12/09/2022)   Hunger Vital Sign    Worried About Running Out of Food in the Last Year: Never true    Ran Out of Food in the Last Year: Never true  Transportation Needs: No Transportation Needs (12/09/2022)   PRAPARE - Administrator, Civil Service (Medical): No    Lack of Transportation (Non-Medical): No  Physical Activity: Not on file  Stress: Not on file  Social Connections: Not on file  Intimate Partner Violence: Not At Risk (12/09/2022)   Humiliation, Afraid, Rape, and Kick questionnaire    Fear of Current or Ex-Partner: No    Emotionally Abused: No    Physically Abused: No    Sexually Abused: No    PHYSICAL EXAM  GENERAL EXAM/CONSTITUTIONAL: Vitals:  Vitals:   11/30/23 1345 11/30/23 1349  BP:  128/84  Pulse:  84  Weight: 121 lb 8 oz (55.1 kg)   Height: 5' 5 (1.651 m)   Orthostatic Vitals for the past 48 hrs (Last 6 readings):  Patient Position Orthostatic BP BP Pulse BP Location Cuff Size Patient Position (if appropriate)  11/30/23 1345 Sitting 126/82 -- -- Left Arm -- --  11/30/23 1349 Standing -- 128/84 84 Left Arm Small Standing     Body mass index is 20.22 kg/m. Wt Readings from Last 3 Encounters:  11/30/23 121 lb 8 oz (55.1 kg)  10/20/23 122 lb (55.3 kg)  09/04/23 125 lb (56.7 kg)   Patient is in no distress; well developed, nourished and groomed; neck is supple  MUSCULOSKELETAL: Gait, strength, tone, movements noted in Neurologic exam below  NEUROLOGIC: MENTAL STATUS:      No data to display         awake, alert,  oriented to person, place and time recent and remote memory intact normal attention and concentration language fluent, comprehension intact, naming intact fund of knowledge appropriate  CRANIAL NERVE:  2nd, 3rd, 4th, 6th - Visual fields full to confrontation, extraocular muscles intact, no nystagmus 5th - facial sensation symmetric 7th - facial strength symmetric 8th - hearing intact 9th - palate elevates symmetrically, uvula midline 11th - shoulder shrug symmetric 12th - tongue protrusion midline  MOTOR:  normal bulk and tone, full strength in the BUE, BLE  SENSORY:  normal and symmetric to light touch  COORDINATION:  finger-nose-finger, fine finger movements normal  GAIT/STATION:  Narrow based, unable to tandem walk, positive Romberg.    DIAGNOSTIC DATA (LABS, IMAGING, TESTING) - I reviewed patient records, labs, notes, testing and imaging myself where available.  Lab Results  Component Value Date   WBC 8.5 11/23/2023   HGB 15.8 (H) 11/23/2023   HCT 49.7 (H) 11/23/2023   MCV 93.2 11/23/2023   PLT 276 11/23/2023      Component Value Date/Time   NA 139 11/23/2023 1118   K 3.5 11/23/2023 1118   CL 103 11/23/2023 1118   CO2 26 11/23/2023 1118   GLUCOSE 92 11/23/2023 1118   BUN 13 11/23/2023 1118   CREATININE 0.73 11/23/2023 1118   CALCIUM  9.7 11/23/2023 1118   PROT 7.8 11/23/2023 1118   ALBUMIN  4.6 11/23/2023 1118   AST 29 11/23/2023 1118   ALT 27 11/23/2023 1118   ALKPHOS 48 11/23/2023 1118   BILITOT 1.4 (H) 11/23/2023 1118   GFRNONAA >60 11/23/2023 1118   GFRAA >60 09/04/2019 0727   Lab Results  Component Value Date   CHOL 139 12/10/2022   HDL 69 12/10/2022   LDLCALC 62 12/10/2022   TRIG 40 12/10/2022   CHOLHDL 2.0 12/10/2022   No results found for: HGBA1C No results found for: VITAMINB12 Lab Results  Component Value Date   TSH 1.358 12/08/2022    MRI Brain 11/23/2023  1.  No evidence of an acute intracranial abnormality. 2. Minor chronic  small vessel ischemic changes within the cerebral white matter. 3. Generalized cerebral atrophy    ASSESSMENT AND PLAN  89 y.o. year old female with hypothyroidism, hyperlipidemia, heart disease who is presenting with ongoing dizziness for the past 5 weeks. Work up including MRI Brain, Carotid US , and cardiac workup all unrevealing.    Chronic dizziness and imbalance Chronic dizziness and imbalance for approximately five weeks, described as lightheadedness and a sensation of things being out of focus. No room spinning sensation reported. Previous evaluations including MRI of the brain, carotid artery check, and ENT assessment were negative. Differential diagnosis includes benign positional vertigo and vestibular neuritis. Dehydration and medication side effects considered as potential contributors. Meclizine  has been used with some relief, but symptoms persist. Concerns about potential worsening with vestibular therapy maneuvers. Steroid therapy discussed but deferred due to potential side effects especially in the elderly. - Continue meclizine  25 mg up to three times a day as needed for dizziness. - Increase fluid intake to improve hydration status. - Refer to vestibular therapy for further evaluation and management. - Educate about potential worsening of symptoms with vestibular therapy before improvement.  Impaired balance Impaired balance with difficulty walking in a straight line and performing tandem walking. No recent falls reported. Balance issues have been present for about a year, with recent exacerbation following vestibular therapy maneuvers. - Refer to vestibular therapy for balance rehabilitation. - Encourage continuation of home exercises to improve balance.    1. Dizziness     Patient Instructions  Increase fluid intake  Continue with Meclizine  25 mg three time daily  Continue your other medications  Referral to vestibular rehab  Continue to follow up with PCP  Return  as needed   Orders Placed This Encounter  Procedures   Ambulatory referral to Physical Therapy    Meds ordered this encounter  Medications   meclizine  (ANTIVERT ) 25 MG tablet    Sig: Take 1 tablet (25 mg total) by mouth 3 (three) times daily as needed for dizziness.    Dispense:  45 tablet    Refill:  0    Return if symptoms worsen or fail to improve.    Pastor Falling, MD 11/30/2023, 2:45 PM  [facility] [address], [address] [place], [place] [address] [phone]

## 2023-12-01 ENCOUNTER — Encounter

## 2023-12-07 ENCOUNTER — Telehealth: Payer: Self-pay

## 2023-12-07 NOTE — Telephone Encounter (Signed)
 Plan of care faxed to [facility]

## 2023-12-21 ENCOUNTER — Other Ambulatory Visit: Payer: Self-pay | Admitting: Radiology

## 2023-12-21 ENCOUNTER — Telehealth: Payer: Self-pay

## 2023-12-21 DIAGNOSIS — M810 Age-related osteoporosis without current pathological fracture: Secondary | ICD-10-CM

## 2023-12-21 MED ORDER — DILTIAZEM HCL ER COATED BEADS 240 MG PO CP24: 240.0000 mg | ORAL_CAPSULE | Freq: Every day | ORAL | 3 refills | Status: AC

## 2023-12-21 NOTE — Telephone Encounter (Signed)
 Med refill request:  Ibandronate  (Boniva ) 150 mg tablet Start: 12/21/23 - 3 tab with 4 refills  Last refill: 09/21/23  Last OV: 07/04/23 Last AEX:  10/04/21 ([place]) Next B&P:  01/31/24 Last MMG (if hormonal med): 01/30/23 Refill authorized: Please Advise?

## 2023-12-21 NOTE — Telephone Encounter (Signed)
 Pt called in returning [facility] call   Best number [phone]

## 2023-12-21 NOTE — Telephone Encounter (Signed)
 Already spoke to patient see previous 8/28 telephone note.

## 2023-12-21 NOTE — Telephone Encounter (Signed)
 Called patient left message on personal voice mail Dr.Jordan reviewed monitor from PCP.Advised to call me back for Dr.Jordan's recommendations.

## 2023-12-21 NOTE — Telephone Encounter (Signed)
 Received a call back from patient.Dr.Jordan reviewed monitor from your PCP.He advised to increase Cardizem  to 240 mg daily.Follow up appointment scheduled with Damien Braver NP 9/30 at 2:20 pm.

## 2023-12-29 ENCOUNTER — Encounter: Payer: Self-pay | Admitting: Internal Medicine

## 2024-01-02 ENCOUNTER — Ambulatory Visit: Payer: Self-pay

## 2024-01-10 ENCOUNTER — Ambulatory Visit
Admission: RE | Admit: 2024-01-10 | Discharge: 2024-01-10 | Disposition: A | Discharge status: Home / Self Care | Source: Ambulatory Visit | Attending: Internal Medicine | Admitting: Internal Medicine

## 2024-01-10 ENCOUNTER — Other Ambulatory Visit (HOSPITAL_COMMUNITY)
Admission: RE | Admit: 2024-01-10 | Discharge: 2024-01-10 | Disposition: A | Discharge status: Home / Self Care | Source: Ambulatory Visit | Attending: Internal Medicine | Admitting: Internal Medicine

## 2024-01-10 DIAGNOSIS — E041 Nontoxic single thyroid nodule: Secondary | ICD-10-CM

## 2024-01-10 DIAGNOSIS — E042 Nontoxic multinodular goiter: Secondary | ICD-10-CM

## 2024-01-12 LAB — CYTOLOGY - NON PAP

## 2024-01-15 ENCOUNTER — Telehealth: Payer: Self-pay

## 2024-01-15 NOTE — Telephone Encounter (Signed)
 Plan of care faxed to [facility]

## 2024-01-22 ENCOUNTER — Ambulatory Visit: Admitting: Nurse Practitioner

## 2024-01-22 NOTE — Progress Notes (Unsigned)
 Cardiology Office Note:    Date:  01/23/2024   ID:  Megan Molina, DOB 08-01-1932, MRN [id number]  PCP:  Roanna Ezekiel NOVAK, MD   [facility] Providers Cardiologist:  Peter Swaziland, MD     Referring MD: Roanna Ezekiel NOVAK, MD   Chief complaint: Follow-up cardiac event monitor  History of Present Illness:    Megan Molina is a 89 y.o. female with a hx of HLD, hypothyroidism, and paroxysmal atrial fibrillation presenting to the clinic for follow-up on results of a cardiac event monitor.  Evaluated by Dr. Swaziland in 05/2019 for evaluation of cardiac arrhythmia.  Monitor at that time demonstrated PACs with short burst of PAT, longest 16 seconds, rare PVCs, 3 short runs of NSVT which were completely asymptomatic.  11/2022 she presented to the hospital with complaints of SOB and hypertension, on arrival was found to be in atrial fibrillation with RVR.  Was started on IV heparin , diltiazem , when she eventually converted to NSR during admission.  Was started on Eliquis  2.5 mg twice daily and discharged on 120 mg of Cardizem  daily.  Has been following with A-fib clinic over the last year.  Cardiac event monitoring was ordered by her PCP on 12/07/2023.  That showed min HR50 2 bpm, max HR 154 bpm, average HR 75 bpm.  Predominant underlying rhythm was sinus rhythm.  Slight P wave morphology changes were noted first-degree AV block was present, A-fib/flutter occurred (2% burden), ranging from 72-154 bpm, longest lasting 1 hour 8 minutes with an average rate of 93 bpm.  Ectopic atrial rhythm was present.  Atrial fibrillation/flutter and ectopic atrial rhythm was detected within 45 seconds of symptomatic patient events.  Dr. Swaziland reviewed monitor results, and increased her diltiazem .  Patient presents to the clinic alone today, appears stable from a cardiovascular standpoint. She denies chest pain, palpitations, dyspnea, pnd, orthopnea, n, v, dizziness, syncope, edema, weight gain, or early satiety.   Reports she has not felt himself going to A-fib recently.  She is complaining of worsening lower extremity edema over the last 8 months that is not improving.  States she does not like to wear compression stockings as they are uncomfortable.   Past Medical History:  Diagnosis Date   Glaucoma    Hypercholesteremia    Hypothyroid    Osteopenia 06/2016   T score -2.4 stable from prior DEXA.    Past Surgical History:  Procedure Laterality Date   ABCESS DRAINAGE     INTESTINAL ABCESS   APPENDECTOMY     BREAST BIOPSY Right    BREAST SURGERY     BIOPSY   cataract surgery     COLONOSCOPY  2013   INGUINAL HERNIA REPAIR Bilateral 09/02/2019   Procedure: LAPAROSCOPIC BILATERAL HERNIA REPAIR, BILATERAL FEMORAL AND LEFT INGUINAL;  Surgeon: Sheldon Standing, MD;  Location: [facility];  Service: General;  Laterality: Bilateral;    Current Medications: Current Meds  Medication Sig   apixaban  (ELIQUIS ) 2.5 MG TABS tablet Take 1 tablet (2.5 mg total) by mouth 2 (two) times daily.   Ascorbic Acid  (VITAMIN C PO) Take 1 tablet by mouth 3 (three) times a week.   Calcium  Carb-Cholecalciferol (CALCIUM  500 + D3 PO) Take 500 mg by mouth daily.   Cholecalciferol (VITAMIN D PO) Take 2,000 Units by mouth.   ciclopirox  (PENLAC ) 8 % solution Apply topically at bedtime. Apply over nail and surrounding skin. Apply daily over previous coat. Remove weekly with polish remover.   Cyanocobalamin (VITAMIN B-12 PO) Take  1 tablet by mouth every 3 (three) days.   diltiazem  (CARDIZEM  CD) 240 MG 24 hr capsule Take 1 capsule (240 mg total) by mouth daily.   dorzolamide  (TRUSOPT ) 2 % ophthalmic solution Place 1 drop into both eyes 2 (two) times daily.    furosemide (LASIX) 20 MG tablet Take 40 mg daily x 3 days, then resume to 20 mg daily   ibandronate  (BONIVA ) 150 MG tablet TAKE 1 TABLET EVERY 30 DAYS IN AM WITH GLASS OF WATER  ON EMPTY STOMACH. NOTHING BY MOUTH OR LIE DOWN FOR NEXT 30 MINS   levothyroxine  (SYNTHROID ) 25 MCG  tablet Take 25 mcg by mouth every other day. T,TH,SAT,SUN   levothyroxine  (SYNTHROID ) 50 MCG tablet Take 50 mcg by mouth every other day. M,W,F   OMEGA-3 FATTY ACIDS PO Take 1 tablet by mouth every 3 (three) days.   rosuvastatin  (CRESTOR ) 10 MG tablet Take 10 mg by mouth daily.   tretinoin (RETIN-A) 0.05 % cream Apply topically at bedtime.     Allergies:   Sulfa antibiotics   Social History   Socioeconomic History   Marital status: Widowed    Spouse name: Not on file   Number of children: 3   Years of education: Not on file   Highest education level: Not on file  Occupational History   Not on file  Tobacco Use   Smoking status: Former    Current packs/day: 0.00    Types: Cigarettes    Quit date: 04/25/1988    Years since quitting: 35.7   Smokeless tobacco: Never  Vaping Use   Vaping status: Never Used  Substance and Sexual Activity   Alcohol use: Not Currently    Alcohol/week: 7.0 standard drinks of alcohol    Types: 7 Standard drinks or equivalent per week   Drug use: No   Sexual activity: Not Currently    Partners: Male    Birth control/protection: Post-menopausal    Comment: 1st intercourse 89 yo-Fewer than 5 partners, answered no to all medicare screening questions  Other Topics Concern   Not on file  Social History Narrative   Not on file   Social Drivers of Health   Financial Resource Strain: Not on file  Food Insecurity: No Food Insecurity (12/09/2022)   Hunger Vital Sign    Worried About Running Out of Food in the Last Year: Never true    Ran Out of Food in the Last Year: Never true  Transportation Needs: No Transportation Needs (12/09/2022)   PRAPARE - Administrator, Civil Service (Medical): No    Lack of Transportation (Non-Medical): No  Physical Activity: Not on file  Stress: Not on file  Social Connections: Not on file     Family History: The patient's family history includes Pancreatic cancer in her mother. There is no history of Colon  cancer or Stomach cancer.  ROS:   Please see the history of present illness.    All other systems reviewed and are negative.  EKGs/Labs/Other Studies Reviewed:    The following studies were reviewed today:  EKG Interpretation Date/Time:  Tuesday January 23 2024 15:47:00 EDT Ventricular Rate:  72 PR Interval:  218 QRS Duration:  76 QT Interval:  380 QTC Calculation: 416 R Axis:   65  Text Interpretation: Sinus rhythm with 1st degree A-V block with Premature atrial complexes Anterior infarct , age undetermined When compared with ECG of 23-Nov-2023 19:44, PREVIOUS ECG IS PRESENT Confirmed by Abshir Paolini [phone]) on 01/23/2024 3:55:29 PM  Recent Labs: 11/23/2023: ALT 27; BUN 13; Creatinine, Ser 0.73; Hemoglobin 15.8; Platelets 276; Potassium 3.5; Sodium 139  Recent Lipid Panel    Component Value Date/Time   CHOL 139 12/10/2022 0158   TRIG 40 12/10/2022 0158   HDL 69 12/10/2022 0158   CHOLHDL 2.0 12/10/2022 0158   VLDL 8 12/10/2022 0158   LDLCALC 62 12/10/2022 0158     Risk Assessment/Calculations:    CHA2DS2-VASc Score = 3   This indicates a 3.2% annual risk of stroke. The patient's score is based upon: CHF History: 0 HTN History: 0 Diabetes History: 0 Stroke History: 0 Vascular Disease History: 0 Age Score: 2 Gender Score: 1       Physical Exam:    VS:  BP (!) 112/56   Pulse 78   Ht 5' 5 (1.651 m)   Wt 122 lb 12.8 oz (55.7 kg)   SpO2 95%   BMI 20.43 kg/m     Wt Readings from Last 3 Encounters:  01/23/24 122 lb 12.8 oz (55.7 kg)  11/30/23 121 lb 8 oz (55.1 kg)  10/20/23 122 lb (55.3 kg)     GEN:  Well nourished, well developed in no acute distress HEENT: Normal NECK: No carotid bruits CARDIAC: RRR, no murmurs, rubs, gallops RESPIRATORY:  Clear to auscultation without rales, wheezing or rhonchi  MUSCULOSKELETAL: Significant bilateral lower extremity +3 pitting edema with small area of bruising/discoloration on left and two small bullae. No  deformity  SKIN: Warm and dry NEUROLOGIC:  Alert and oriented x 3 PSYCHIATRIC:  Normal affect   ASSESSMENT:    1. Tachycardia   2. Lower extremity edema   3. Atrial fibrillation, unspecified type (HCC)    PLAN:    In order of problems listed above:  Lower extremity edema Denies chest pain, shortness of breath, DOE, orthopnea, other symptoms of fluid volume overload Significant bilateral lower extremity +3 pitting edema with small area of bruising/discoloration on left and two small bullae present Previous vascular ultrasound performed 10/24/2023 was negative for DVT bilaterally, also demonstrated bilateral venous insufficiency.  Will order lasix 40 mg daily x 3 days, to be decreased to 20 mg daily afterward Will order BMP to be done 1 week after starting lasix Recommending compression stockings and leg elevation Will discuss w/ primary cardiologist and consider switching patient from Cardizem  to metoprolol  at follow up visit considering this may be contributing to patients LE edema  Paroxysmal atrial fibrillation EKG: NSR, first-degree AV block, PACs, 72 bpm Continue Eliquis  2.5 mg twice daily as this is the correct dose for her age, weight, creatinine Continue Cardizem  240 mg daily Discussed Kardia mobile for detection of A-fib at home Reviewed ED precautions Continue home monitoring for fast heart rate  HTN BPs reported well-controlled at home Continue diltiazem  240 mg daily  Follow-up in 1 month with MD or APP  Medication Adjustments/Labs and Tests Ordered: Current medicines are reviewed at length with the patient today.  Concerns regarding medicines are outlined above.  Orders Placed This Encounter  Procedures   Basic metabolic panel with GFR   EKG [id number]   Meds ordered this encounter  Medications   furosemide (LASIX) 20 MG tablet    Sig: Take 40 mg daily x 3 days, then resume to 20 mg daily    Dispense:  90 tablet    Refill:  3    Patient Instructions   Medication Instructions:  Start Furosemide (Lasix) 40 mg daily for 3 days, then reduce to 20 mg daily  *  If you need a refill on your cardiac medications before your next appointment, please call your pharmacy*  Lab Work: BMET in 1 week  Testing/Procedures: NONE ordered at this time of appointment    Follow-Up: At [facility], you and your health needs are our priority.  As part of our continuing mission to provide you with exceptional heart care, our providers are all part of one team.  This team includes your primary Cardiologist (physician) and Advanced Practice Providers or APPs (Physician Assistants and Nurse Practitioners) who all work together to provide you with the care you need, when you need it.  Your next appointment:   1 month(s)  Provider:   Peter Swaziland, MD or Miriam Shams, NP or Damien Braver, NP          We recommend signing up for the patient portal called MyChart.  Sign up information is provided on this After Visit Summary.  MyChart is used to connect with patients for Virtual Visits (Telemedicine).  Patients are able to view lab/test results, encounter notes, upcoming appointments, etc.  Non-urgent messages can be sent to your provider as well.   To learn more about what you can do with MyChart, go to [email or web address].   Other Instructions Please wear compression socks or stockings and elevate legs as much as possible.            Signed, Miriam FORBES Shams, NP  01/23/2024 9:18 PM    [facility]

## 2024-01-23 ENCOUNTER — Ambulatory Visit: Attending: Nurse Practitioner | Admitting: Emergency Medicine

## 2024-01-23 ENCOUNTER — Encounter: Payer: Self-pay | Admitting: Nurse Practitioner

## 2024-01-23 ENCOUNTER — Other Ambulatory Visit: Payer: Self-pay

## 2024-01-23 VITALS — BP 112/56 | HR 78 | Ht 65.0 in | Wt 122.8 lb

## 2024-01-23 DIAGNOSIS — I10 Essential (primary) hypertension: Secondary | ICD-10-CM

## 2024-01-23 DIAGNOSIS — R000 Tachycardia, unspecified: Secondary | ICD-10-CM

## 2024-01-23 DIAGNOSIS — I4891 Unspecified atrial fibrillation: Secondary | ICD-10-CM

## 2024-01-23 DIAGNOSIS — R600 Localized edema: Secondary | ICD-10-CM

## 2024-01-23 MED ORDER — FUROSEMIDE 20 MG PO TABS: ORAL_TABLET | ORAL | 3 refills | Status: DC

## 2024-01-23 NOTE — Patient Instructions (Addendum)
 Medication Instructions:  Start Furosemide (Lasix) 40 mg daily for 3 days, then reduce to 20 mg daily  *If you need a refill on your cardiac medications before your next appointment, please call your pharmacy*  Lab Work: BMET in 1 week  Testing/Procedures: NONE ordered at this time of appointment    Follow-Up: At [facility], you and your health needs are our priority.  As part of our continuing mission to provide you with exceptional heart care, our providers are all part of one team.  This team includes your primary Cardiologist (physician) and Advanced Practice Providers or APPs (Physician Assistants and Nurse Practitioners) who all work together to provide you with the care you need, when you need it.  Your next appointment:   1 month(s)  Provider:   Peter Swaziland, MD or Miriam Shams, NP or Damien Braver, NP          We recommend signing up for the patient portal called MyChart.  Sign up information is provided on this After Visit Summary.  MyChart is used to connect with patients for Virtual Visits (Telemedicine).  Patients are able to view lab/test results, encounter notes, upcoming appointments, etc.  Non-urgent messages can be sent to your provider as well.   To learn more about what you can do with MyChart, go to [email or web address].   Other Instructions Please wear compression socks or stockings and elevate legs as much as possible.

## 2024-01-24 ENCOUNTER — Other Ambulatory Visit (HOSPITAL_COMMUNITY): Payer: Self-pay | Admitting: *Deleted

## 2024-01-24 ENCOUNTER — Telehealth: Payer: Self-pay | Admitting: *Deleted

## 2024-01-24 MED ORDER — POTASSIUM CHLORIDE ER 10 MEQ PO TBCR
10.0000 meq | EXTENDED_RELEASE_TABLET | Freq: Every day | ORAL | 3 refills | Status: DC
Start: 2024-01-24 — End: 2024-02-27

## 2024-01-24 MED ORDER — APIXABAN 2.5 MG PO TABS: 2.5000 mg | ORAL_TABLET | Freq: Two times a day (BID) | ORAL | 2 refills | Status: DC

## 2024-01-24 NOTE — Telephone Encounter (Signed)
 Call placed to pt. She is aware to start Potassium 10 meq daily.  Rx sent to [facility], per pt's request.

## 2024-01-24 NOTE — Telephone Encounter (Signed)
-----   Message from Miriam FORBES Shams sent at 01/24/2024  1:45 PM EDT ----- Please place order for 10 meq of potassium daily to be taken with lasix. Please call the patient and let her know I want her to start on this supplement.

## 2024-01-25 ENCOUNTER — Other Ambulatory Visit (HOSPITAL_COMMUNITY): Payer: Self-pay | Admitting: *Deleted

## 2024-01-25 MED ORDER — APIXABAN 2.5 MG PO TABS: 2.5000 mg | ORAL_TABLET | Freq: Two times a day (BID) | ORAL | 2 refills | Status: AC

## 2024-01-30 LAB — BASIC METABOLIC PANEL WITH GFR
BUN/Creatinine Ratio: 26 (ref 12–28)
BUN: 19 mg/dL (ref 8–27)
CO2: 23 mmol/L (ref 20–29)
Calcium: 9.6 mg/dL (ref 8.7–10.3)
Chloride: 100 mmol/L (ref 96–106)
Creatinine, Ser: 0.72 mg/dL (ref 0.57–1.00)
Glucose: 79 mg/dL (ref 70–99)
Potassium: 4.8 mmol/L (ref 3.5–5.2)
Sodium: 139 mmol/L (ref 134–144)
eGFR: 80 mL/min/1.73 (ref >59)

## 2024-01-31 ENCOUNTER — Ambulatory Visit: Payer: Self-pay | Admitting: Emergency Medicine

## 2024-01-31 ENCOUNTER — Encounter: Payer: Self-pay | Admitting: Radiology

## 2024-01-31 ENCOUNTER — Ambulatory Visit: Admitting: Radiology

## 2024-01-31 VITALS — BP 112/66 | HR 82 | Ht 65.0 in | Wt 121.0 lb

## 2024-01-31 DIAGNOSIS — Z01419 Encounter for gynecological examination (general) (routine) without abnormal findings: Secondary | ICD-10-CM | POA: Diagnosis not present

## 2024-01-31 NOTE — Progress Notes (Signed)
   Megan Molina April 22, 1933 [id number]   History: Postmenopausal 89 y.o. presents for low risk breast and pelvic.  Risk Factors for Medicare Patients  >/= 5 sexual partners in a lifetime: No  First intercourse <64 years of age: No  H/O STD at any age: No  Abnormal pap smear, < 3 negative paps within the last 7 years: No  DES exposure (women born between [phone]): No  Patient is on post breast cancer medication like Femara or, if medication like this is not needed, 5 years post breast cancer diagnosis: No   Gynecologic History Postmenopausal Last Pap: 2016. Results were: normal Last mammogram: 01/30/23. Results were: normal Last colonoscopy: 2013 DEXA:8/23- repeat scheduled 1/26  Obstetric History OB History  Gravida Para Term Preterm AB Living  3 3 3   3   SAB IAB Ectopic Multiple Live Births          # Outcome Date GA Lbr Len/2nd Weight Sex Type Anes PTL Lv  3 Term           2 Term           1 Term               The following portions of the patient's history were reviewed and updated as appropriate: allergies, current medications, past family history, past medical history, past social history, past surgical history, and problem list.  Review of Systems Pertinent items noted in HPI and remainder of comprehensive ROS otherwise negative.  Past medical history, past surgical history, family history and social history were all reviewed and documented in the EPIC chart.  Exam:  Vitals:   01/31/24 1332  BP: 112/66  Pulse: 82  SpO2: 94%  Weight: 121 lb (54.9 kg)  Height: 5' 5 (1.651 m)   Body mass index is 20.14 kg/m.  General appearance:  Normal Thyroid :  Symmetrical, normal in size, without palpable masses or nodularity. Respiratory  Auscultation:  Clear without wheezing or rhonchi Cardiovascular  Auscultation:  Regular rate, without rubs, murmurs or gallops  Edema/varicosities:  Not grossly evident Abdominal  Soft,nontender, without masses, guarding or  rebound.  Liver/spleen:  No organomegaly noted  Hernia:  None appreciated  Skin  Inspection:  Grossly normal Breasts: Examined lying and sitting.   Right: Without masses, retractions, nipple discharge or axillary adenopathy.   Left: Without masses, retractions, nipple discharge or axillary adenopathy. Genitourinary   Inguinal/mons:  Normal without inguinal adenopathy  External genitalia:  Normal appearing vulva with no masses, tenderness, or lesions  BUS/Urethra/Skene's glands:  Normal  Vagina:  Normal appearing with normal color and discharge, no lesions. Atrophy: moderate   Cervix:  Normal appearing without discharge or lesions  Uterus:  Normal in size, shape and contour.  Midline and mobile, nontender  Adnexa/parametria:     Rt: Normal in size, without masses or tenderness.   Lt: Normal in size, without masses or tenderness.  Anus and perineum: Normal    Dereck Keas, CMA present for exam  Assessment/Plan:   1. Encounter for breast and pelvic examination (Primary) Mammo yearly   Return in 2 years for annual or sooner prn.  Stewart Pimenta B WHNP-BC, 1:53 PM 01/31/2024

## 2024-02-01 ENCOUNTER — Ambulatory Visit

## 2024-02-02 NOTE — Telephone Encounter (Signed)
 Pt returning call to nurse. Pt will be home until 9:30 am

## 2024-02-13 ENCOUNTER — Ambulatory Visit
Admission: RE | Admit: 2024-02-13 | Discharge: 2024-02-13 | Disposition: A | Source: Ambulatory Visit | Attending: Internal Medicine | Admitting: Internal Medicine

## 2024-02-13 DIAGNOSIS — Z1231 Encounter for screening mammogram for malignant neoplasm of breast: Secondary | ICD-10-CM

## 2024-02-15 ENCOUNTER — Ambulatory Visit: Admitting: Podiatry

## 2024-02-15 VITALS — Ht 65.0 in | Wt 121.0 lb

## 2024-02-15 DIAGNOSIS — B351 Tinea unguium: Secondary | ICD-10-CM

## 2024-02-15 DIAGNOSIS — M79675 Pain in left toe(s): Secondary | ICD-10-CM | POA: Diagnosis not present

## 2024-02-15 DIAGNOSIS — M79674 Pain in right toe(s): Secondary | ICD-10-CM | POA: Diagnosis not present

## 2024-02-18 ENCOUNTER — Encounter: Payer: Self-pay | Admitting: Podiatry

## 2024-02-18 NOTE — Progress Notes (Signed)
  Subjective:  Patient ID: Megan Molina, female    DOB: [birth year],  MRN: [id number]  Chief Complaint  Patient presents with   Nail Problem    Rm 6 Patient has bilateral onychomycosis. Severe thickening of the right and lleft hallux.     89 y.o. female presents with the above complaint. History confirmed with patient.  The nails are quite difficult to manage.  They cause pain in her shoe gear.  Previous debridements been helpful in reducing pain and improving function.  Objective:  Physical Exam: warm, good capillary refill, no trophic changes or ulcerative lesions, normal DP and PT pulses, and normal sensory exam. Left Foot: dystrophic yellowed discolored nail plates with subungual debris Right Foot: dystrophic yellowed discolored nail plates with subungual debris    Assessment:   1. Pain due to onychomycosis of toenails of both feet      Plan:  Patient was evaluated and treated and all questions answered.   Discussed the etiology and treatment options for the condition in detail with the patient. Recommended debridement of the nails today. Sharp and mechanical debridement performed of all painful and mycotic nails today. Nails debrided in length and thickness using a nail nipper to level of comfort. Follow up as needed for painful nails.    Return in about 3 months (around 05/17/2024) for RFC.

## 2024-02-20 ENCOUNTER — Ambulatory Visit: Admitting: Nurse Practitioner

## 2024-02-20 NOTE — Progress Notes (Signed)
 Cardiology Office Note:  .   Date:  02/27/2024  ID:  Megan Molina, DOB July 01, 1932, MRN [id number] PCP: Roanna Ezekiel NOVAK, MD  [facility] Providers Cardiologist:  Peter Jordan, MD    History of Present Illness: Megan Molina   Megan Molina is a 89 y.o. female  with a hx of HLD, hypothyroidism, and paroxysmal atrial fibrillation presenting to the clinic for follow-up on results of a cardiac event monitor.   Evaluated by Dr. Jordan in 05/2019 for evaluation of cardiac arrhythmia.  Monitor at that time demonstrated PACs with short burst of PAT, longest 16 seconds, rare PVCs, 3 short runs of NSVT which were completely asymptomatic.   11/2022 she presented to the hospital with complaints of SOB and hypertension, on arrival was found to be in atrial fibrillation with RVR.  Was started on IV heparin , diltiazem , when she eventually converted to NSR during admission.  Was started on Eliquis  2.5 mg twice daily and discharged on 120 mg of Cardizem  daily.  Has been following with A-fib clinic over the last year.   Cardiac event monitoring was ordered by her PCP on 12/07/2023.  That showed min HR50 2 bpm, max HR 154 bpm, average HR 75 bpm.  Predominant underlying rhythm was sinus rhythm.  Slight P wave morphology changes were noted first-degree AV block was present, A-fib/flutter occurred (2% burden), ranging from 72-154 bpm, longest lasting 1 hour 8 minutes with an average rate of 93 bpm.  Ectopic atrial rhythm was present.  Atrial fibrillation/flutter and ectopic atrial rhythm was detected within 45 seconds of symptomatic patient events.  Dr. Jordan reviewed monitor results, and increased her diltiazem .  Patient was seen in our office [birth year] with LE edema and started on lasix. She says swelling isn't any better. Doesn't urinate anymore on the lasix. Wearing compression stockings at home but not today. Keeps legs elevated when sitting during the day and propped up on 2 pillows at night. Swelling didn't get worse when  diltiazem  was doubled. Lives at [facility] and may be getting extra salt when dining there.  ROS:    Studies Reviewed: Megan Molina         Prior CV Studies:    Echo 11/2022 IMPRESSIONS     1. Left ventricular ejection fraction, by estimation, is 70 to 75%. The  left ventricle has hyperdynamic function. The left ventricle has no  regional wall motion abnormalities. Left ventricular diastolic parameters  are indeterminate.   2. Right ventricular systolic function is normal. The right ventricular  size is normal.   3. Left atrial size was moderately dilated.   4. Right atrial size was mildly dilated.   5. The mitral valve is normal in structure. Mild mitral valve  regurgitation.   6. The aortic valve is normal in structure. Aortic valve regurgitation is  not visualized.   7. The inferior vena cava is dilated in size with <50% respiratory  variability, suggesting right atrial pressure of 15 mmHg.    Risk Assessment/Calculations:    CHA2DS2-VASc Score = 3   This indicates a 3.2% annual risk of stroke. The patient's score is based upon: CHF History: 0 HTN History: 0 Diabetes History: 0 Stroke History: 0 Vascular Disease History: 0 Age Score: 2 Gender Score: 1            Physical Exam:   VS:  BP (!) 122/50 (BP Location: Left Arm, Patient Position: Sitting, Cuff Size: Normal)   Pulse 77   Ht 5' 5 (1.651  m)   Wt 125 lb (56.7 kg)   BMI 20.80 kg/m    Orhtostatics: No data found. Wt Readings from Last 3 Encounters:  02/27/24 125 lb (56.7 kg)  02/15/24 121 lb (54.9 kg)  01/31/24 121 lb (54.9 kg)    GEN: Thin, in no acute distress NECK: No JVD; No carotid bruits CARDIAC:  RRR, no murmurs, rubs, gallops RESPIRATORY:  Clear to auscultation without rales, wheezing or rhonchi  ABDOMEN: Soft, non-tender, non-distended EXTREMITIES:  trace edema L>R; No deformity   ASSESSMENT AND PLAN: .    Lower extremity edema -chronic venous insufficiency -not better on lasix-will make  lasix/potassium prn -compression hose -keep legs elevated when sitting -increase water  intake. -low sodium diet.   Paroxysmal atrial fibrillation Continue Eliquis  2.5 mg twice daily as this is the correct dose for her age, weight, creatinine Continue Cardizem  240 mg daily Continue home monitoring for fast heart rate   HTN BPs controlled Continue diltiazem  240 mg daily        Dispo: f/u Dr. Jordan  Signed, Olivia Pavy, PA-C

## 2024-02-27 ENCOUNTER — Ambulatory Visit: Attending: Physician Assistant | Admitting: Physician Assistant

## 2024-02-27 ENCOUNTER — Encounter: Payer: Self-pay | Admitting: Physician Assistant

## 2024-02-27 VITALS — BP 122/50 | HR 77 | Ht 65.0 in | Wt 125.0 lb

## 2024-02-27 DIAGNOSIS — I10 Essential (primary) hypertension: Secondary | ICD-10-CM | POA: Diagnosis not present

## 2024-02-27 DIAGNOSIS — R600 Localized edema: Secondary | ICD-10-CM | POA: Diagnosis not present

## 2024-02-27 DIAGNOSIS — I4891 Unspecified atrial fibrillation: Secondary | ICD-10-CM

## 2024-02-27 NOTE — Patient Instructions (Signed)
 Medication Instructions:  Only take Lasix and Potassium daily if needed Continue all other medications *If you need a refill on your cardiac medications before your next appointment, please call your pharmacy*  Lab Work: None ordered  Testing/Procedures: None ordered  Follow-Up: At [facility], you and your health needs are our priority.  As part of our continuing mission to provide you with exceptional heart care, our providers are all part of one team.  This team includes your primary Cardiologist (physician) and Advanced Practice Providers or APPs (Physician Assistants and Nurse Practitioners) who all work together to provide you with the care you need, when you need it.  Your next appointment:  Monday 3/2 at 1:20 pm    Provider:  Dr.Jordan   We recommend signing up for the patient portal called MyChart.  Sign up information is provided on this After Visit Summary.  MyChart is used to connect with patients for Virtual Visits (Telemedicine).  Patients are able to view lab/test results, encounter notes, upcoming appointments, etc.  Non-urgent messages can be sent to your provider as well.   To learn more about what you can do with MyChart, go to [email or web address].

## 2024-05-06 ENCOUNTER — Ambulatory Visit (HOSPITAL_BASED_OUTPATIENT_CLINIC_OR_DEPARTMENT_OTHER)
Admission: RE | Admit: 2024-05-06 | Discharge: 2024-05-06 | Disposition: A | Discharge status: Home / Self Care | Source: Ambulatory Visit | Attending: Radiology | Admitting: Radiology

## 2024-05-06 DIAGNOSIS — M810 Age-related osteoporosis without current pathological fracture: Secondary | ICD-10-CM | POA: Diagnosis present

## 2024-05-07 ENCOUNTER — Ambulatory Visit: Payer: Self-pay | Admitting: Radiology

## 2024-05-16 ENCOUNTER — Ambulatory Visit: Admitting: Radiology

## 2024-05-16 ENCOUNTER — Encounter: Payer: Self-pay | Admitting: Radiology

## 2024-05-16 VITALS — BP 120/60 | HR 79 | Wt 122.0 lb

## 2024-05-16 DIAGNOSIS — M810 Age-related osteoporosis without current pathological fracture: Secondary | ICD-10-CM

## 2024-05-16 NOTE — Progress Notes (Signed)
 "  Megan Molina [birth year] [id number]   History: Postmenopausal 89 y.o. presents to discuss bone density. On Boniva  x 4 years with no improvement. T score of radius is -3.6. Open to starting Prolia.   Gynecologic History Postmenopausal   Obstetric History OB History  Gravida Para Term Preterm AB Living  3 3 3   3   SAB IAB Ectopic Multiple Live Births          # Outcome Date GA Lbr Len/2nd Weight Sex Type Anes PTL Lv  3 Term           2 Term           1 Term             Narrative & Impression  EXAM: DUAL X-RAY ABSORPTIOMETRY (DXA) FOR BONE MINERAL DENSITY 05/06/2024 2:25 pm   CLINICAL DATA:  89 year old Female Postmenopausal. Osteoporosis   Patient is or has been on bone building therapies.   TECHNIQUE: An axial (e.g., hips, spine) and/or appendicular (e.g., radius) exam was performed, as appropriate, using GE Secretary/administrator at [employer]. Images are obtained for bone mineral density measurement and are not obtained for diagnostic purposes. [id number]   Exclusions: Lumbar spine due to degenerative changes.   COMPARISON:  None. New baseline.   FINDINGS: Scan quality: Good.   LEFT FEMORAL NECK:   BMD (in g/cm2): 0.895   T-score: -1.0   Z-score: 1.8   LEFT TOTAL HIP:   BMD (in g/cm2): 0.938   T-score: -0.6   Z-score: 2.2   RIGHT FEMORAL NECK:   BMD (in g/cm2): 0.685   T-score: -2.5   Z-score: 0.2   RIGHT TOTAL HIP:   BMD (in g/cm2): 0.750   T-score: -2.0   Z-score: 0.7   LEFT FOREARM (RADIUS 33%):   BMD (in g/cm2): 0.561   T-score: -3.6   Z-score: 0.0   FRAX 10-YEAR PROBABILITY OF FRACTURE:   FRAX not reported as the lowest BMD is not in the osteopenia range.   IMPRESSION: Osteoporosis based on BMD.   Fracture risk is unknown due to history of bone building therapy.   RECOMMENDATIONS: 1. All patients should optimize calcium  and vitamin D intake.   2. Consider FDA-approved medical therapies in  postmenopausal women and men aged 48 years and older, based on the following:   - A hip or vertebral (clinical or morphometric) fracture   - T-score less than or equal to -2.5 and secondary causes have been excluded.   - Low bone mass (T-score between -1.0 and -2.5) and a 10-year probability of a hip fracture greater than or equal to 3% or a 10-year probability of a major osteoporosis-related fracture greater than or equal to 20% based on the [place] -adapted WHO algorithm.   - Clinician judgment and/or patient preferences may indicate treatment for people with 10-year fracture probabilities above or below these levels   3. Patients with diagnosis of osteoporosis or at high risk for fracture should have regular bone mineral density tests. For patients eligible for Medicare, routine testing is allowed once every 2 years. The testing frequency can be increased to one year for patients who have rapidly progressing disease, those who are receiving or discontinuing medical therapy to restore bone mass, or have additional risk factors.     Electronically Signed   By: Alm Parkins M.D.   On: 05/06/2024 14:40     The following portions of the patient's history were reviewed and updated as  appropriate: allergies, current medications, past family history, past medical history, past social history, past surgical history, and problem list.  Review of Systems Pertinent items noted in HPI and remainder of comprehensive ROS otherwise negative.  Past medical history, past surgical history, family history and social history were all reviewed and documented in the EPIC chart.  Exam:  Vitals:   05/16/24 1450  BP: 120/60  Pulse: 79  SpO2: 97%  Weight: 122 lb (55.3 kg)   Body mass index is 20.3 kg/m.    Megan Molina, CMA present for exam  Assessment/Plan:   1. Age-related osteoporosis without current pathological fracture (Primary) Risks and benefits of Prolia reviewed, order. -  Comprehensive metabolic panel with GFR   Megan Molina B WHNP-BC, 3:04 PM 05/16/2024 "

## 2024-05-17 ENCOUNTER — Other Ambulatory Visit: Payer: Self-pay | Admitting: *Deleted

## 2024-05-17 ENCOUNTER — Encounter: Payer: Self-pay | Admitting: *Deleted

## 2024-05-17 ENCOUNTER — Other Ambulatory Visit: Payer: Self-pay

## 2024-05-17 DIAGNOSIS — M810 Age-related osteoporosis without current pathological fracture: Secondary | ICD-10-CM

## 2024-05-17 LAB — COMPREHENSIVE METABOLIC PANEL WITH GFR
AG Ratio: 2.4 (calc) (ref 1.0–2.5)
ALT: 25 U/L (ref 6–29)
AST: 21 U/L (ref 10–35)
Albumin: 4.5 g/dL (ref 3.6–5.1)
Alkaline phosphatase (APISO): 57 U/L (ref 37–153)
BUN: 22 mg/dL (ref 7–25)
CO2: 28 mmol/L (ref 20–32)
Calcium: 9.6 mg/dL (ref 8.6–10.4)
Chloride: 103 mmol/L (ref 98–110)
Creat: 0.94 mg/dL (ref 0.60–0.95)
Globulin: 1.9 g/dL (ref 1.9–3.7)
Glucose, Bld: 87 mg/dL (ref 65–99)
Potassium: 4.4 mmol/L (ref 3.5–5.3)
Sodium: 137 mmol/L (ref 135–146)
Total Bilirubin: 0.7 mg/dL (ref 0.2–1.2)
Total Protein: 6.4 g/dL (ref 6.1–8.1)
eGFR: 58 mL/min/1.73m2 — ABNORMAL LOW

## 2024-05-17 MED ORDER — DENOSUMAB 60 MG/ML ~~LOC~~ SOSY
60.0000 mg | PREFILLED_SYRINGE | Freq: Once | SUBCUTANEOUS | Status: AC
  Administered 2024-05-31: 60 mg via SUBCUTANEOUS

## 2024-05-20 ENCOUNTER — Telehealth: Payer: Self-pay

## 2024-05-20 NOTE — Telephone Encounter (Signed)
 Prolia  VOB initiated via [email or web address]  Next Prolia  inj DUE: NEW START

## 2024-05-21 ENCOUNTER — Ambulatory Visit: Payer: Self-pay | Admitting: Radiology

## 2024-05-21 NOTE — Telephone Encounter (Signed)
 SABRA

## 2024-05-21 NOTE — Telephone Encounter (Signed)
 MEDICAL PA SUBMITTED VIA LATENT. KEY: [id number]

## 2024-05-22 NOTE — Telephone Encounter (Signed)
 See referral

## 2024-05-22 NOTE — Telephone Encounter (Signed)
 Buy/Bill (Office supplied medication)  Out-of-pocket cost due at time of clinic visit: $0  Number of injection/visits approved: 2  Primary: [employer] Co-insurance: 0% Admin fee co-insurance: 0%  Secondary: --- Co-insurance:  Admin fee co-insurance:   Medical Benefit Details: Date Benefits were checked: 05/20/24 Deductible: NO/ Coinsurance: 0%/ Admin Fee: 0%  Prior Auth: APPROVED PA# [id number] Expiration Date: 05/21/24-04/24/25   # of doses approved: 2 -----------------------------------------------------------------------  Patient NOT eligible for Copay Card. Copay Card can make patient's cost as little as $25. Link to apply: [email or web address]  ** This summary of benefits is an estimation of the patient's out-of-pocket cost. Exact cost may very based on individual plan coverage.

## 2024-05-31 ENCOUNTER — Ambulatory Visit

## 2024-05-31 DIAGNOSIS — M810 Age-related osteoporosis without current pathological fracture: Secondary | ICD-10-CM

## 2024-05-31 NOTE — Progress Notes (Signed)
 1st Prolia  injection 05/31/24. Given in R arm. Pt sat for about 10 min, pt tolerated injection well.

## 2024-06-24 ENCOUNTER — Ambulatory Visit: Admitting: Cardiology

## 2024-06-25 ENCOUNTER — Ambulatory Visit: Admitting: Podiatry
